# Patient Record
Sex: Male | Born: 1960 | Race: White | Hispanic: Yes | Smoking: Current every day smoker
Health system: Southern US, Community
[De-identification: ages and names within clinical notes are randomized; demographics above are authoritative.]

## PROBLEM LIST (undated history)

## (undated) DIAGNOSIS — I5032 Chronic diastolic (congestive) heart failure: Secondary | ICD-10-CM

## (undated) DIAGNOSIS — N19 Unspecified kidney failure: Secondary | ICD-10-CM

## (undated) DIAGNOSIS — I219 Acute myocardial infarction, unspecified: Secondary | ICD-10-CM

## (undated) DIAGNOSIS — E11319 Type 2 diabetes mellitus with unspecified diabetic retinopathy without macular edema: Secondary | ICD-10-CM

## (undated) DIAGNOSIS — N184 Chronic kidney disease, stage 4 (severe): Secondary | ICD-10-CM

## (undated) DIAGNOSIS — G629 Polyneuropathy, unspecified: Secondary | ICD-10-CM

## (undated) DIAGNOSIS — E119 Type 2 diabetes mellitus without complications: Secondary | ICD-10-CM

## (undated) DIAGNOSIS — F319 Bipolar disorder, unspecified: Secondary | ICD-10-CM

## (undated) DIAGNOSIS — J449 Chronic obstructive pulmonary disease, unspecified: Secondary | ICD-10-CM

## (undated) DIAGNOSIS — F0390 Unspecified dementia without behavioral disturbance: Secondary | ICD-10-CM

## (undated) DIAGNOSIS — D649 Anemia, unspecified: Secondary | ICD-10-CM

## (undated) DIAGNOSIS — G8929 Other chronic pain: Secondary | ICD-10-CM

## (undated) DIAGNOSIS — L97519 Non-pressure chronic ulcer of other part of right foot with unspecified severity: Secondary | ICD-10-CM

## (undated) DIAGNOSIS — I129 Hypertensive chronic kidney disease with stage 1 through stage 4 chronic kidney disease, or unspecified chronic kidney disease: Secondary | ICD-10-CM

## (undated) DIAGNOSIS — F1411 Cocaine abuse, in remission: Secondary | ICD-10-CM

## (undated) DIAGNOSIS — F1011 Alcohol abuse, in remission: Secondary | ICD-10-CM

## (undated) DIAGNOSIS — I639 Cerebral infarction, unspecified: Secondary | ICD-10-CM

## (undated) DIAGNOSIS — I10 Essential (primary) hypertension: Secondary | ICD-10-CM

## (undated) DIAGNOSIS — N049 Nephrotic syndrome with unspecified morphologic changes: Secondary | ICD-10-CM

## (undated) DIAGNOSIS — E785 Hyperlipidemia, unspecified: Secondary | ICD-10-CM

## (undated) DIAGNOSIS — Z8619 Personal history of other infectious and parasitic diseases: Secondary | ICD-10-CM

## (undated) DIAGNOSIS — E559 Vitamin D deficiency, unspecified: Secondary | ICD-10-CM

## (undated) DIAGNOSIS — S2231XA Fracture of one rib, right side, initial encounter for closed fracture: Secondary | ICD-10-CM

## (undated) DIAGNOSIS — F250 Schizoaffective disorder, bipolar type: Secondary | ICD-10-CM

## (undated) HISTORY — DX: Nephrotic syndrome with unspecified morphologic changes: N04.9

## (undated) HISTORY — DX: Non-pressure chronic ulcer of other part of right foot with unspecified severity: L97.519

## (undated) HISTORY — DX: Hypertensive chronic kidney disease with stage 1 through stage 4 chronic kidney disease, or unspecified chronic kidney disease: N18.4

## (undated) HISTORY — DX: Bipolar disorder, unspecified: F31.9

## (undated) HISTORY — DX: Hypertensive chronic kidney disease with stage 1 through stage 4 chronic kidney disease, or unspecified chronic kidney disease: I12.9

## (undated) HISTORY — PX: SPLENECTOMY, PARTIAL: SHX787

## (undated) HISTORY — DX: Alcohol abuse, in remission: F10.11

## (undated) HISTORY — DX: Polyneuropathy, unspecified: G62.9

## (undated) HISTORY — DX: Anemia, unspecified: D64.9

## (undated) HISTORY — DX: Other chronic pain: G89.29

## (undated) HISTORY — DX: Chronic diastolic (congestive) heart failure: I50.32

## (undated) HISTORY — DX: Cocaine abuse, in remission: F14.11

## (undated) HISTORY — DX: Vitamin D deficiency, unspecified: E55.9

## (undated) HISTORY — DX: Hyperlipidemia, unspecified: E78.5

## (undated) HISTORY — DX: Personal history of other infectious and parasitic diseases: Z86.19

## (undated) HISTORY — DX: Schizoaffective disorder, bipolar type: F25.0

## (undated) HISTORY — DX: Unspecified dementia, unspecified severity, without behavioral disturbance, psychotic disturbance, mood disturbance, and anxiety: F03.90

## (undated) HISTORY — DX: Type 2 diabetes mellitus with unspecified diabetic retinopathy without macular edema: E11.319

---
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
## 2012-09-14 DIAGNOSIS — I219 Acute myocardial infarction, unspecified: Secondary | ICD-10-CM

## 2012-09-14 HISTORY — DX: Acute myocardial infarction, unspecified: I21.9

## 2017-05-20 HISTORY — PX: US ECHOCARDIOGRAPHY: HXRAD669

## 2017-06-03 HISTORY — PX: RENAL BIOPSY: SHX156

## 2017-06-30 HISTORY — PX: US ECHOCARDIOGRAPHY: HXRAD669

## 2017-07-01 HISTORY — PX: CARDIOVASCULAR STRESS TEST: SHX262

## 2018-09-14 DIAGNOSIS — S2231XA Fracture of one rib, right side, initial encounter for closed fracture: Secondary | ICD-10-CM

## 2018-09-14 HISTORY — DX: Fracture of one rib, right side, initial encounter for closed fracture: S22.31XA

## 2018-10-15 DIAGNOSIS — N19 Unspecified kidney failure: Secondary | ICD-10-CM

## 2018-10-15 HISTORY — DX: Unspecified kidney failure: N19

## 2019-02-26 ENCOUNTER — Encounter (HOSPITAL_COMMUNITY): Payer: Self-pay | Admitting: Emergency Medicine

## 2019-02-26 ENCOUNTER — Other Ambulatory Visit: Payer: Self-pay

## 2019-02-26 ENCOUNTER — Emergency Department (HOSPITAL_COMMUNITY)
Admission: EM | Admit: 2019-02-26 | Discharge: 2019-02-26 | Disposition: A | Discharge status: Home / Self Care | Payer: Medicare Other | Attending: Emergency Medicine | Admitting: Emergency Medicine

## 2019-02-26 DIAGNOSIS — L84 Corns and callosities: Secondary | ICD-10-CM | POA: Insufficient documentation

## 2019-02-26 DIAGNOSIS — J449 Chronic obstructive pulmonary disease, unspecified: Secondary | ICD-10-CM | POA: Insufficient documentation

## 2019-02-26 DIAGNOSIS — Z4800 Encounter for change or removal of nonsurgical wound dressing: Secondary | ICD-10-CM | POA: Insufficient documentation

## 2019-02-26 DIAGNOSIS — E119 Type 2 diabetes mellitus without complications: Secondary | ICD-10-CM | POA: Diagnosis not present

## 2019-02-26 DIAGNOSIS — I252 Old myocardial infarction: Secondary | ICD-10-CM | POA: Diagnosis not present

## 2019-02-26 DIAGNOSIS — F17290 Nicotine dependence, other tobacco product, uncomplicated: Secondary | ICD-10-CM | POA: Insufficient documentation

## 2019-02-26 DIAGNOSIS — F17210 Nicotine dependence, cigarettes, uncomplicated: Secondary | ICD-10-CM | POA: Insufficient documentation

## 2019-02-26 DIAGNOSIS — Z8673 Personal history of transient ischemic attack (TIA), and cerebral infarction without residual deficits: Secondary | ICD-10-CM | POA: Diagnosis not present

## 2019-02-26 DIAGNOSIS — Z5189 Encounter for other specified aftercare: Secondary | ICD-10-CM

## 2019-02-26 DIAGNOSIS — I10 Essential (primary) hypertension: Secondary | ICD-10-CM | POA: Insufficient documentation

## 2019-02-26 HISTORY — DX: Essential (primary) hypertension: I10

## 2019-02-26 HISTORY — DX: Chronic obstructive pulmonary disease, unspecified: J44.9

## 2019-02-26 HISTORY — DX: Cerebral infarction, unspecified: I63.9

## 2019-02-26 HISTORY — DX: Type 2 diabetes mellitus without complications: E11.9

## 2019-02-26 LAB — CBG MONITORING, ED: Glucose-Capillary: 182 mg/dL — ABNORMAL HIGH (ref 70–99)

## 2019-02-26 NOTE — ED Notes (Signed)
Pt asking for food while in triage, asked if he had ate today and pt responded "no" and that he "slept most of the day"

## 2019-02-26 NOTE — ED Notes (Signed)
Wounds to distal plantar surface of R foot   There are 2 wounds

## 2019-02-26 NOTE — ED Notes (Signed)
Pt reports three (3) month history of wounds to plantar surface R foot   He [place]-   Has not seen physician in [place] "because I didn't think it was a big deal" Here for eval   Jello and water provided for complaint of hunger

## 2019-02-26 NOTE — ED Triage Notes (Signed)
Patient c/o diabetic wound to bottom of right foot. Per patient wound appeared 3 month and is continually getting bigger. Denies any drainage or fevers. Per patient blood sugars 130-190s.

## 2019-02-26 NOTE — ED Notes (Signed)
BS 165

## 2019-02-26 NOTE — ED Triage Notes (Signed)
Pt request meal in triage   Here with daughter who is also checking in

## 2019-02-26 NOTE — ED Notes (Signed)
[place] address [place], asking for food  Complains of foot ulcer for 3 months

## 2019-02-26 NOTE — Discharge Instructions (Addendum)
Follow-up with your PCP or podiatry. "Mole skin" or other similar product may help alleviate some of the pressure in these areas until you can be seen.

## 2019-02-27 LAB — CBG MONITORING, ED: Glucose-Capillary: 165 mg/dL — ABNORMAL HIGH (ref 70–99)

## 2019-03-01 NOTE — ED Provider Notes (Signed)
[facility] EMERGENCY DEPARTMENT Provider Note   CSN: [id number] Arrival date & time: 02/26/19  1638     History   Chief Complaint Chief Complaint  Patient presents with  . Wound Check    HPI Barry Lewis is a 58 y.o. male.     HPI   83yM with wounds to R foot. Has had previously treated by podiatry with resolution. Says they came back 2-3 months ago. Hx of neuropathy but says these are painful when applying pressure.   Past Medical History:  Diagnosis Date  . Bipolar 1 disorder ([place])   . COPD (chronic obstructive pulmonary disease) ([place])   . Diabetes mellitus without complication ([place])   . Hypertension   . MI (myocardial infarction) ([place])   . Renal disorder   . Stroke [facility])     There are no active problems to display for this patient.   Past Surgical History:  Procedure Laterality Date  . SPLENECTOMY, PARTIAL        Home Medications    Prior to Admission medications   Not on File    Family History History reviewed. No pertinent family history.  Social History Social History   Tobacco Use  . Smoking status: Current Every Day Smoker    Packs/day: 0.50    Years: 1.50    Pack years: 0.75    Types: Cigarettes, Cigars  . Smokeless tobacco: Never Used  Substance Use Topics  . Alcohol use: Not Currently    Frequency: Never    Comment: occasional  . Drug use: Not Currently    Types: Cocaine    Comment: not in past 2 months     Allergies   Lithium and Ibuprofen   Review of Systems Review of Systems  All systems reviewed and negative, other than as noted in HPI.  Physical Exam Updated Vital Signs BP (!) 150/70 (BP Location: Right Arm)   Pulse (!) 50   Temp 98.1 F (36.7 C) (Oral)   Resp 16   Ht 5\' 6"  (1.676 m)   Wt 87.5 kg   SpO2 99%   BMI 31.15 kg/m   Physical Exam Vitals signs and nursing note reviewed.  Constitutional:      General: He is not in acute distress.    Appearance: He is well-developed.  HENT:     Head:  Normocephalic and atraumatic.  Eyes:     General:        Right eye: No discharge.        Left eye: No discharge.     Conjunctiva/sclera: Conjunctivae normal.  Neck:     Musculoskeletal: Neck supple.  Cardiovascular:     Rate and Rhythm: Normal rate and regular rhythm.     Heart sounds: Normal heart sounds. No murmur. No friction rub. No gallop.   Pulmonary:     Effort: Pulmonary effort is normal. No respiratory distress.     Breath sounds: Normal breath sounds.  Abdominal:     General: There is no distension.     Palpations: Abdomen is soft.     Tenderness: There is no abdominal tenderness.  Musculoskeletal:        General: No tenderness.     Comments: Two calloused areas to plantar surface of R distal foot. These do not appears to be infected.   Skin:    General: Skin is warm and dry.  Neurological:     Mental Status: He is alert.  Psychiatric:        Behavior:  Behavior normal.        Thought Content: Thought content normal.    ED Treatments / Results  Labs (all labs ordered are listed, but only abnormal results are displayed) Labs Reviewed  CBG MONITORING, ED - Abnormal; Notable for the following components:      Result Value   Glucose-Capillary 182 (*)    All other components within normal limits  CBG MONITORING, ED - Abnormal; Notable for the following components:   Glucose-Capillary 165 (*)    All other components within normal limits    EKG    Radiology No results found.  Procedures Procedures (including critical care time)  Medications Ordered in ED Medications - No data to display   Initial Impression / Assessment and Plan / ED Course  I have reviewed the triage vital signs and the nursing notes.  Pertinent labs & imaging results that were available during my care of the patient were reviewed by me and considered in my medical decision making (see chart for details).     calluses to R foot. Not infected. Needs podiatry fu for further management.    Final Clinical Impressions(s) / ED Diagnoses   Final diagnoses:  Visit for wound check  Foot callus    ED Discharge Orders    None       Virgel Manifold, MD 03/01/19 [phone]

## 2019-05-04 ENCOUNTER — Encounter: Payer: Self-pay | Admitting: Family Medicine

## 2019-05-04 DIAGNOSIS — I5032 Chronic diastolic (congestive) heart failure: Secondary | ICD-10-CM | POA: Insufficient documentation

## 2019-05-11 ENCOUNTER — Encounter: Payer: Self-pay | Admitting: Family Medicine

## 2019-05-15 ENCOUNTER — Encounter: Payer: Self-pay | Admitting: Family Medicine

## 2019-05-16 ENCOUNTER — Ambulatory Visit: Payer: Medicare Other | Admitting: Family Medicine

## 2019-05-24 ENCOUNTER — Encounter: Payer: Self-pay | Admitting: General Practice

## 2019-06-14 ENCOUNTER — Inpatient Hospital Stay
Admission: AD | Admit: 2019-06-14 | Payer: Medicare Other | Source: Other Acute Inpatient Hospital | Admitting: Family Medicine

## 2019-06-23 ENCOUNTER — Ambulatory Visit: Payer: Medicare Other | Admitting: Family Medicine

## 2019-10-13 ENCOUNTER — Inpatient Hospital Stay (HOSPITAL_COMMUNITY)
Admission: EM | Admit: 2019-10-13 | Discharge: 2019-10-18 | DRG: 682 | Disposition: A | Discharge status: Rehabilitation Facility | Payer: Medicare Other | Attending: Internal Medicine | Admitting: Internal Medicine

## 2019-10-13 ENCOUNTER — Emergency Department (HOSPITAL_COMMUNITY): Payer: Medicare Other

## 2019-10-13 DIAGNOSIS — T796XXS Traumatic ischemia of muscle, sequela: Secondary | ICD-10-CM | POA: Diagnosis not present

## 2019-10-13 DIAGNOSIS — S22030A Wedge compression fracture of third thoracic vertebra, initial encounter for closed fracture: Secondary | ICD-10-CM | POA: Diagnosis not present

## 2019-10-13 DIAGNOSIS — E860 Dehydration: Secondary | ICD-10-CM | POA: Diagnosis present

## 2019-10-13 DIAGNOSIS — I5042 Chronic combined systolic (congestive) and diastolic (congestive) heart failure: Secondary | ICD-10-CM | POA: Diagnosis present

## 2019-10-13 DIAGNOSIS — Y92009 Unspecified place in unspecified non-institutional (private) residence as the place of occurrence of the external cause: Secondary | ICD-10-CM

## 2019-10-13 DIAGNOSIS — I130 Hypertensive heart and chronic kidney disease with heart failure and stage 1 through stage 4 chronic kidney disease, or unspecified chronic kidney disease: Secondary | ICD-10-CM | POA: Diagnosis present

## 2019-10-13 DIAGNOSIS — Z79899 Other long term (current) drug therapy: Secondary | ICD-10-CM

## 2019-10-13 DIAGNOSIS — M542 Cervicalgia: Secondary | ICD-10-CM

## 2019-10-13 DIAGNOSIS — T1490XA Injury, unspecified, initial encounter: Secondary | ICD-10-CM

## 2019-10-13 DIAGNOSIS — S42101A Fracture of unspecified part of scapula, right shoulder, initial encounter for closed fracture: Secondary | ICD-10-CM | POA: Diagnosis present

## 2019-10-13 DIAGNOSIS — G8929 Other chronic pain: Secondary | ICD-10-CM | POA: Diagnosis present

## 2019-10-13 DIAGNOSIS — T07XXXA Unspecified multiple injuries, initial encounter: Secondary | ICD-10-CM

## 2019-10-13 DIAGNOSIS — S12601A Unspecified nondisplaced fracture of seventh cervical vertebra, initial encounter for closed fracture: Secondary | ICD-10-CM | POA: Diagnosis present

## 2019-10-13 DIAGNOSIS — R739 Hyperglycemia, unspecified: Secondary | ICD-10-CM | POA: Diagnosis not present

## 2019-10-13 DIAGNOSIS — I2510 Atherosclerotic heart disease of native coronary artery without angina pectoris: Secondary | ICD-10-CM | POA: Diagnosis present

## 2019-10-13 DIAGNOSIS — L97419 Non-pressure chronic ulcer of right heel and midfoot with unspecified severity: Secondary | ICD-10-CM | POA: Diagnosis present

## 2019-10-13 DIAGNOSIS — E1122 Type 2 diabetes mellitus with diabetic chronic kidney disease: Secondary | ICD-10-CM | POA: Diagnosis present

## 2019-10-13 DIAGNOSIS — L97519 Non-pressure chronic ulcer of other part of right foot with unspecified severity: Secondary | ICD-10-CM | POA: Diagnosis present

## 2019-10-13 DIAGNOSIS — J90 Pleural effusion, not elsewhere classified: Secondary | ICD-10-CM

## 2019-10-13 DIAGNOSIS — N179 Acute kidney failure, unspecified: Secondary | ICD-10-CM | POA: Diagnosis present

## 2019-10-13 DIAGNOSIS — Z9119 Patient's noncompliance with other medical treatment and regimen: Secondary | ICD-10-CM

## 2019-10-13 DIAGNOSIS — I10 Essential (primary) hypertension: Secondary | ICD-10-CM | POA: Diagnosis present

## 2019-10-13 DIAGNOSIS — W010XXA Fall on same level from slipping, tripping and stumbling without subsequent striking against object, initial encounter: Secondary | ICD-10-CM | POA: Diagnosis present

## 2019-10-13 DIAGNOSIS — E785 Hyperlipidemia, unspecified: Secondary | ICD-10-CM | POA: Diagnosis present

## 2019-10-13 DIAGNOSIS — E119 Type 2 diabetes mellitus without complications: Secondary | ICD-10-CM | POA: Diagnosis not present

## 2019-10-13 DIAGNOSIS — E1165 Type 2 diabetes mellitus with hyperglycemia: Secondary | ICD-10-CM | POA: Diagnosis not present

## 2019-10-13 DIAGNOSIS — I5032 Chronic diastolic (congestive) heart failure: Secondary | ICD-10-CM | POA: Diagnosis not present

## 2019-10-13 DIAGNOSIS — Z8673 Personal history of transient ischemic attack (TIA), and cerebral infarction without residual deficits: Secondary | ICD-10-CM

## 2019-10-13 DIAGNOSIS — E08621 Diabetes mellitus due to underlying condition with foot ulcer: Secondary | ICD-10-CM | POA: Diagnosis not present

## 2019-10-13 DIAGNOSIS — E1142 Type 2 diabetes mellitus with diabetic polyneuropathy: Secondary | ICD-10-CM

## 2019-10-13 DIAGNOSIS — L0390 Cellulitis, unspecified: Secondary | ICD-10-CM

## 2019-10-13 DIAGNOSIS — Z20822 Contact with and (suspected) exposure to covid-19: Secondary | ICD-10-CM | POA: Diagnosis present

## 2019-10-13 DIAGNOSIS — S060X9A Concussion with loss of consciousness of unspecified duration, initial encounter: Secondary | ICD-10-CM | POA: Diagnosis present

## 2019-10-13 DIAGNOSIS — I129 Hypertensive chronic kidney disease with stage 1 through stage 4 chronic kidney disease, or unspecified chronic kidney disease: Secondary | ICD-10-CM | POA: Diagnosis present

## 2019-10-13 DIAGNOSIS — M549 Dorsalgia, unspecified: Secondary | ICD-10-CM | POA: Diagnosis present

## 2019-10-13 DIAGNOSIS — E11621 Type 2 diabetes mellitus with foot ulcer: Secondary | ICD-10-CM | POA: Diagnosis present

## 2019-10-13 DIAGNOSIS — B1920 Unspecified viral hepatitis C without hepatic coma: Secondary | ICD-10-CM | POA: Diagnosis present

## 2019-10-13 DIAGNOSIS — S0081XA Abrasion of other part of head, initial encounter: Secondary | ICD-10-CM | POA: Diagnosis present

## 2019-10-13 DIAGNOSIS — M6282 Rhabdomyolysis: Secondary | ICD-10-CM | POA: Diagnosis present

## 2019-10-13 DIAGNOSIS — G9341 Metabolic encephalopathy: Secondary | ICD-10-CM | POA: Diagnosis present

## 2019-10-13 DIAGNOSIS — J449 Chronic obstructive pulmonary disease, unspecified: Secondary | ICD-10-CM | POA: Diagnosis present

## 2019-10-13 DIAGNOSIS — F250 Schizoaffective disorder, bipolar type: Secondary | ICD-10-CM | POA: Diagnosis present

## 2019-10-13 DIAGNOSIS — E669 Obesity, unspecified: Secondary | ICD-10-CM | POA: Diagnosis present

## 2019-10-13 DIAGNOSIS — S2243XA Multiple fractures of ribs, bilateral, initial encounter for closed fracture: Secondary | ICD-10-CM | POA: Diagnosis present

## 2019-10-13 DIAGNOSIS — Y92002 Bathroom of unspecified non-institutional (private) residence single-family (private) house as the place of occurrence of the external cause: Secondary | ICD-10-CM | POA: Diagnosis not present

## 2019-10-13 DIAGNOSIS — F0390 Unspecified dementia without behavioral disturbance: Secondary | ICD-10-CM | POA: Diagnosis present

## 2019-10-13 DIAGNOSIS — F17210 Nicotine dependence, cigarettes, uncomplicated: Secondary | ICD-10-CM | POA: Diagnosis present

## 2019-10-13 DIAGNOSIS — Z6834 Body mass index (BMI) 34.0-34.9, adult: Secondary | ICD-10-CM

## 2019-10-13 DIAGNOSIS — I252 Old myocardial infarction: Secondary | ICD-10-CM

## 2019-10-13 DIAGNOSIS — E43 Unspecified severe protein-calorie malnutrition: Secondary | ICD-10-CM

## 2019-10-13 DIAGNOSIS — Z794 Long term (current) use of insulin: Secondary | ICD-10-CM

## 2019-10-13 DIAGNOSIS — W19XXXA Unspecified fall, initial encounter: Secondary | ICD-10-CM

## 2019-10-13 DIAGNOSIS — R601 Generalized edema: Secondary | ICD-10-CM

## 2019-10-13 DIAGNOSIS — R339 Retention of urine, unspecified: Secondary | ICD-10-CM | POA: Diagnosis not present

## 2019-10-13 DIAGNOSIS — N184 Chronic kidney disease, stage 4 (severe): Secondary | ICD-10-CM | POA: Diagnosis present

## 2019-10-13 DIAGNOSIS — Z888 Allergy status to other drugs, medicaments and biological substances status: Secondary | ICD-10-CM

## 2019-10-13 DIAGNOSIS — L97411 Non-pressure chronic ulcer of right heel and midfoot limited to breakdown of skin: Secondary | ICD-10-CM | POA: Diagnosis not present

## 2019-10-13 DIAGNOSIS — Z23 Encounter for immunization: Secondary | ICD-10-CM | POA: Diagnosis not present

## 2019-10-13 DIAGNOSIS — E872 Acidosis: Secondary | ICD-10-CM | POA: Diagnosis present

## 2019-10-13 DIAGNOSIS — Z9081 Acquired absence of spleen: Secondary | ICD-10-CM

## 2019-10-13 LAB — COMPREHENSIVE METABOLIC PANEL
ALT: 115 U/L — ABNORMAL HIGH (ref 0–44)
ALT: 91 U/L — ABNORMAL HIGH (ref 0–44)
ALT: 88 U/L — ABNORMAL HIGH (ref 0–44)
AST: 61 U/L — ABNORMAL HIGH (ref 15–41)
AST: 38 U/L (ref 15–41)
AST: 36 U/L (ref 15–41)
Albumin: 2 g/dL — ABNORMAL LOW (ref 3.5–5.0)
Albumin: 1.7 g/dL — ABNORMAL LOW (ref 3.5–5.0)
Albumin: 1.7 g/dL — ABNORMAL LOW (ref 3.5–5.0)
Alkaline Phosphatase: 138 U/L — ABNORMAL HIGH (ref 38–126)
Alkaline Phosphatase: 117 U/L (ref 38–126)
Alkaline Phosphatase: 118 U/L (ref 38–126)
Anion gap: 12 (ref 5–15)
Anion gap: 11 (ref 5–15)
Anion gap: 11 (ref 5–15)
BUN: 62 mg/dL — ABNORMAL HIGH (ref 6–20)
BUN: 59 mg/dL — ABNORMAL HIGH (ref 6–20)
BUN: 59 mg/dL — ABNORMAL HIGH (ref 6–20)
CO2: 16 mmol/L — ABNORMAL LOW (ref 22–32)
CO2: 14 mmol/L — ABNORMAL LOW (ref 22–32)
CO2: 16 mmol/L — ABNORMAL LOW (ref 22–32)
Calcium: 7.1 mg/dL — ABNORMAL LOW (ref 8.9–10.3)
Calcium: 7.1 mg/dL — ABNORMAL LOW (ref 8.9–10.3)
Calcium: 7.1 mg/dL — ABNORMAL LOW (ref 8.9–10.3)
Chloride: 105 mmol/L (ref 98–111)
Chloride: 110 mmol/L (ref 98–111)
Chloride: 105 mmol/L (ref 98–111)
Creatinine, Ser: 5.06 mg/dL — ABNORMAL HIGH (ref 0.61–1.24)
Creatinine, Ser: 4.8 mg/dL — ABNORMAL HIGH (ref 0.61–1.24)
Creatinine, Ser: 4.77 mg/dL — ABNORMAL HIGH (ref 0.61–1.24)
GFR calc Af Amer: 14 mL/min — ABNORMAL LOW (ref >60)
GFR calc Af Amer: 15 mL/min — ABNORMAL LOW (ref >60)
GFR calc Af Amer: 15 mL/min — ABNORMAL LOW (ref >60)
GFR calc non Af Amer: 12 mL/min — ABNORMAL LOW (ref >60)
GFR calc non Af Amer: 13 mL/min — ABNORMAL LOW (ref >60)
GFR calc non Af Amer: 13 mL/min — ABNORMAL LOW (ref >60)
Glucose, Bld: 306 mg/dL — ABNORMAL HIGH (ref 70–99)
Glucose, Bld: 242 mg/dL — ABNORMAL HIGH (ref 70–99)
Glucose, Bld: 229 mg/dL — ABNORMAL HIGH (ref 70–99)
Potassium: 5 mmol/L (ref 3.5–5.1)
Potassium: 4.8 mmol/L (ref 3.5–5.1)
Potassium: 4.9 mmol/L (ref 3.5–5.1)
Sodium: 133 mmol/L — ABNORMAL LOW (ref 135–145)
Sodium: 135 mmol/L (ref 135–145)
Sodium: 132 mmol/L — ABNORMAL LOW (ref 135–145)
Total Bilirubin: 0.6 mg/dL (ref 0.3–1.2)
Total Bilirubin: 0.5 mg/dL (ref 0.3–1.2)
Total Bilirubin: 0.4 mg/dL (ref 0.3–1.2)
Total Protein: 6.4 g/dL — ABNORMAL LOW (ref 6.5–8.1)
Total Protein: 5.4 g/dL — ABNORMAL LOW (ref 6.5–8.1)
Total Protein: 5.6 g/dL — ABNORMAL LOW (ref 6.5–8.1)

## 2019-10-13 LAB — URINALYSIS, ROUTINE W REFLEX MICROSCOPIC
Bacteria, UA: NONE SEEN
Bilirubin Urine: NEGATIVE
Glucose, UA: >=500 mg/dL — AB
Hgb urine dipstick: NEGATIVE
Ketones, ur: 5 mg/dL — AB
Leukocytes,Ua: NEGATIVE
Nitrite: NEGATIVE
Protein, ur: >=300 mg/dL — AB
Specific Gravity, Urine: 1.013 (ref 1.005–1.030)
pH: 6 (ref 5.0–8.0)

## 2019-10-13 LAB — HEMOGLOBIN A1C
Hgb A1c MFr Bld: 8.5 % — ABNORMAL HIGH (ref 4.8–5.6)
Mean Plasma Glucose: 197.25 mg/dL

## 2019-10-13 LAB — HIV ANTIBODY (ROUTINE TESTING W REFLEX): HIV Screen 4th Generation wRfx: NONREACTIVE

## 2019-10-13 LAB — CBC
HCT: 36.1 % — ABNORMAL LOW (ref 39.0–52.0)
Hemoglobin: 12.2 g/dL — ABNORMAL LOW (ref 13.0–17.0)
MCH: 32.7 pg (ref 26.0–34.0)
MCHC: 33.8 g/dL (ref 30.0–36.0)
MCV: 96.8 fL (ref 80.0–100.0)
Platelets: 340 10*3/uL (ref 150–400)
RBC: 3.73 MIL/uL — ABNORMAL LOW (ref 4.22–5.81)
RDW: 14.9 % (ref 11.5–15.5)
WBC: 8.7 10*3/uL (ref 4.0–10.5)
nRBC: 0 % (ref 0.0–0.2)

## 2019-10-13 LAB — PROTIME-INR
INR: 1 (ref 0.8–1.2)
Prothrombin Time: 12.9 seconds (ref 11.4–15.2)

## 2019-10-13 LAB — SARS CORONAVIRUS 2 (TAT 6-24 HRS): SARS Coronavirus 2: NEGATIVE

## 2019-10-13 LAB — SAMPLE TO BLOOD BANK

## 2019-10-13 LAB — GLUCOSE, CAPILLARY
Glucose-Capillary: 262 mg/dL — ABNORMAL HIGH (ref 70–99)
Glucose-Capillary: 198 mg/dL — ABNORMAL HIGH (ref 70–99)
Glucose-Capillary: 257 mg/dL — ABNORMAL HIGH (ref 70–99)

## 2019-10-13 LAB — CBG MONITORING, ED: Glucose-Capillary: 259 mg/dL — ABNORMAL HIGH (ref 70–99)

## 2019-10-13 LAB — CK
Total CK: 649 U/L — ABNORMAL HIGH (ref 49–397)
Total CK: 262 U/L (ref 49–397)

## 2019-10-13 LAB — ETHANOL: Alcohol, Ethyl (B): <10 mg/dL (ref <10)

## 2019-10-13 LAB — LACTIC ACID, PLASMA: Lactic Acid, Venous: 1.1 mmol/L (ref 0.5–1.9)

## 2019-10-13 MED ORDER — VENLAFAXINE HCL ER 75 MG PO CP24: 225.0000 mg | ORAL_CAPSULE | Freq: Every day | ORAL | Status: DC

## 2019-10-13 MED ORDER — INSULIN ASPART 100 UNIT/ML ~~LOC~~ SOLN
0.0000 [IU] | Freq: Every day | SUBCUTANEOUS | Status: DC
  Administered 2019-10-13 – 2019-10-15 (×2): 3 [IU] via SUBCUTANEOUS
  Administered 2019-10-16 – 2019-10-17 (×2): 2 [IU] via SUBCUTANEOUS

## 2019-10-13 MED ORDER — ONDANSETRON HCL 4 MG/2ML IJ SOLN: 4.0000 mg | Freq: Four times a day (QID) | INTRAMUSCULAR | Status: DC | PRN

## 2019-10-13 MED ORDER — GABAPENTIN 300 MG PO CAPS: 300.0000 mg | ORAL_CAPSULE | Freq: Two times a day (BID) | ORAL | Status: DC

## 2019-10-13 MED ORDER — OLANZAPINE 5 MG PO TABS
5.0000 mg | ORAL_TABLET | Freq: Every day | ORAL | Status: DC
  Administered 2019-10-13: 22:00:00 5 mg via ORAL
  Filled 2019-10-13: qty 1

## 2019-10-13 MED ORDER — INSULIN ASPART 100 UNIT/ML ~~LOC~~ SOLN
0.0000 [IU] | Freq: Three times a day (TID) | SUBCUTANEOUS | Status: DC
  Administered 2019-10-13: 8 [IU] via SUBCUTANEOUS
  Administered 2019-10-13: 3 [IU] via SUBCUTANEOUS
  Administered 2019-10-14: 12:00:00 5 [IU] via SUBCUTANEOUS
  Administered 2019-10-14: 08:00:00 11 [IU] via SUBCUTANEOUS
  Administered 2019-10-15: 18:00:00 5 [IU] via SUBCUTANEOUS
  Administered 2019-10-15: 8 [IU] via SUBCUTANEOUS
  Administered 2019-10-16: 04:00:00 2 [IU] via SUBCUTANEOUS
  Administered 2019-10-16: 5 [IU] via SUBCUTANEOUS
  Administered 2019-10-16 – 2019-10-17 (×2): 8 [IU] via SUBCUTANEOUS
  Administered 2019-10-17: 12:00:00 3 [IU] via SUBCUTANEOUS
  Administered 2019-10-17: 06:00:00 2 [IU] via SUBCUTANEOUS
  Administered 2019-10-18: 5 [IU] via SUBCUTANEOUS
  Administered 2019-10-18: 12:00:00 8 [IU] via SUBCUTANEOUS

## 2019-10-13 MED ORDER — GABAPENTIN 600 MG PO TABS: 600.0000 mg | ORAL_TABLET | Freq: Every day | ORAL | Status: DC

## 2019-10-13 MED ORDER — ONDANSETRON HCL 4 MG PO TABS: 4.0000 mg | ORAL_TABLET | Freq: Four times a day (QID) | ORAL | Status: DC | PRN

## 2019-10-13 MED ORDER — LACTATED RINGERS IV SOLN: INTRAVENOUS | Status: DC

## 2019-10-13 MED ORDER — TRAZODONE HCL 100 MG PO TABS
200.0000 mg | ORAL_TABLET | Freq: Every day | ORAL | Status: DC
  Administered 2019-10-13 – 2019-10-17 (×4): 200 mg via ORAL
  Filled 2019-10-13 (×4): qty 2

## 2019-10-13 MED ORDER — AMLODIPINE BESYLATE 10 MG PO TABS
10.0000 mg | ORAL_TABLET | Freq: Every day | ORAL | Status: DC
  Administered 2019-10-13 – 2019-10-18 (×6): 10 mg via ORAL
  Filled 2019-10-13: qty 2
  Filled 2019-10-13 (×3): qty 1
  Filled 2019-10-13: qty 2
  Filled 2019-10-13: qty 1

## 2019-10-13 MED ORDER — HEPARIN SODIUM (PORCINE) 5000 UNIT/ML IJ SOLN
5000.0000 [IU] | Freq: Three times a day (TID) | INTRAMUSCULAR | Status: DC
  Administered 2019-10-13 – 2019-10-18 (×13): 5000 [IU] via SUBCUTANEOUS
  Filled 2019-10-13 (×14): qty 1

## 2019-10-13 MED ORDER — FENTANYL CITRATE (PF) 100 MCG/2ML IJ SOLN
100.0000 ug | Freq: Once | INTRAMUSCULAR | Status: AC
  Administered 2019-10-13: 100 ug via INTRAMUSCULAR
  Filled 2019-10-13: qty 2

## 2019-10-13 MED ORDER — DOCUSATE SODIUM 100 MG PO CAPS
100.0000 mg | ORAL_CAPSULE | Freq: Two times a day (BID) | ORAL | Status: DC
  Administered 2019-10-13 – 2019-10-18 (×9): 100 mg via ORAL
  Filled 2019-10-13 (×9): qty 1

## 2019-10-13 MED ORDER — INSULIN DEGLUDEC 200 UNIT/ML ~~LOC~~ SOPN: 32.0000 [IU] | PEN_INJECTOR | Freq: Every day | SUBCUTANEOUS | Status: DC

## 2019-10-13 MED ORDER — ACETAMINOPHEN 650 MG RE SUPP: 650.0000 mg | Freq: Four times a day (QID) | RECTAL | Status: DC | PRN

## 2019-10-13 MED ORDER — INSULIN GLARGINE 100 UNIT/ML ~~LOC~~ SOLN
32.0000 [IU] | Freq: Every day | SUBCUTANEOUS | Status: DC
  Filled 2019-10-13: qty 0.32

## 2019-10-13 MED ORDER — SODIUM CHLORIDE 0.9 % IV BOLUS (SEPSIS)
1000.0000 mL | Freq: Once | INTRAVENOUS | Status: AC
  Administered 2019-10-13: 06:00:00 1000 mL via INTRAVENOUS

## 2019-10-13 MED ORDER — ACETAMINOPHEN 325 MG PO TABS
650.0000 mg | ORAL_TABLET | Freq: Four times a day (QID) | ORAL | Status: DC | PRN
  Administered 2019-10-13 – 2019-10-14 (×2): 650 mg via ORAL
  Filled 2019-10-13 (×3): qty 2

## 2019-10-13 MED ORDER — CARBAMAZEPINE ER 200 MG PO TB12
600.0000 mg | ORAL_TABLET | Freq: Every day | ORAL | Status: DC
  Administered 2019-10-13: 22:00:00 600 mg via ORAL
  Filled 2019-10-13: qty 3

## 2019-10-13 NOTE — ED Provider Notes (Signed)
Pt signed out by Dr. Christy Gentles pending call back from admission service.  Pt d/w Dr. Lorin Mercy (triad) for admission.     Isla Pence, MD 10/13/19 [phone]
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
## 2019-10-13 NOTE — Consult Note (Signed)
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 Generalized age-related cerebral atrophy with mild chronic small vessel ischemic disease. CT CERVICAL SPINE: 1. Mild height loss at the superior endplate of C7, age indeterminate. Correlation with physical exam for possible pain at this location recommended. Additionally, finding could be further assessed with dedicated MRI as clinically warranted. 2. Nondisplaced fractures of the right transverse processes of T1 and T2, also somewhat age indeterminate. 3. Remote fracture of the T1 spinous process, stable from previous. Electronically Signed   By: Jeannine Boga M.D.   On: 10/13/2019 04:33   CT T-SPINE NO CHARGE  Result Date: 10/13/2019 CLINICAL DATA:  Initial evaluation for acute trauma, fall. Back pain. EXAM: CT THORACIC SPINE WITHOUT CONTRAST TECHNIQUE: Multidetector CT images of the thoracic were obtained using the standard protocol without intravenous contrast. COMPARISON:  Prior CT from 04/26/2019. FINDINGS: Alignment: Vertebral bodies normally aligned with preservation of the normal thoracic kyphosis. No listhesis. Vertebrae:  Chronic height loss at the superior endplates of T2 and T4 is stable from previous exam. Remote fracture of the T1 spinous process also unchanged. Mild approximate 20% height loss at the superior endplate of T3, new from previous, and could be acute to subacute in nature. No bony retropulsion. Additionally, there are nondisplaced fractures of the right transverse processes of T1, T2, and T3, also new from previous, and could be acute to subacute in nature. Otherwise, vertebral body height maintained with no other acute fracture identified. No discrete osseous lesions. Multiple remotely healed rib fractures noted, stable. Paraspinal and other soft tissues: Paraspinous soft tissues demonstrate no acute finding. Large right greater than left pleural effusions with associated atelectasis and/or consolidation, better evaluated on concomitant chest CT. Disc levels: Mild scattered degenerative endplate spurring noted within the mid and lower thoracic spine. No appreciable spinal stenosis. IMPRESSION: 1. Mild approximate 20% height loss at the superior endplate of T3, somewhat age indeterminate, but new from previous, and could be acute to subacute in nature. No bony retropulsion. Correlation with physical exam recommended. 2. Nondisplaced fractures of the right transverse processes of T1, T2, and T3, also new from previous, and could be acute to subacute  in nature. 3. Chronic compression deformities at the superior endplates of T2 and T4, stable. 4. Large right greater than left pleural effusions with associated atelectasis and/or consolidation, better evaluated on concomitant chest CT. Electronically Signed   By: Jeannine Boga M.D.   On: 10/13/2019 03:54   CT L-SPINE NO CHARGE  Result Date: 10/13/2019 CLINICAL DATA:  Initial evaluation for acute trauma, fall. EXAM: CT LUMBAR SPINE WITHOUT CONTRAST TECHNIQUE: Multidetector CT imaging of the lumbar spine was performed without intravenous contrast administration.  Multiplanar CT image reconstructions were also generated. COMPARISON:  Prior CT from 04/26/2019. FINDINGS: Segmentation: Standard. Lowest well-formed disc space labeled the L5-S1 level. Alignment: Physiologic with preservation of the normal lumbar lordosis. No subluxation or malalignment. Vertebrae: Vertebral body height maintained without evidence for acute or chronic fracture. Visualized sacrum and pelvis intact. SI joints approximated symmetric. No discrete osseous lesions. Paraspinal and other soft tissues: Paraspinous soft tissues within normal limits. Disc levels: L1-2:  Unremarkable. L2-3:  Negative interspace. Mild facet hypertrophy. No stenosis. L3-4: Chronic intervertebral disc space narrowing with diffuse disc bulge. Mild facet hypertrophy. Resultant mild-to-moderate spinal stenosis. Moderate right with mild left foraminal narrowing. L4-5: Mild disc bulge. Bilateral facet hypertrophy. No significant stenosis. L5-S1:  Minimal disc bulge. No stenosis. IMPRESSION: 1. No acute traumatic injury within the lumbar spine. 2. Degenerative disc bulge with facet hypertrophy at L3-4 with resultant mild to moderate spinal stenosis. Electronically Signed   By: Jeannine Boga M.D.   On: 10/13/2019 04:05   DG Chest Port 1 View  Result Date: 10/13/2019 CLINICAL DATA:  Fall EXAM: PORTABLE CHEST 1 VIEW COMPARISON:  06/11/2019 FINDINGS: Cardiomegaly. Probable scarring in the left lung, similar to prior study. Right lung clear. No effusions. Multiple old left rib fractures. There appear to be acute lateral left rib fractures of the 5th through 7th ribs. No pneumothorax. IMPRESSION: Multiple old left rib fractures. There appear to be acute superimposed fractures in the lateral left 5th through 7th ribs. Chronic opacities in the left lung, likely scarring. Mild cardiomegaly. Electronically Signed   By: Rolm Baptise M.D.   On: 10/13/2019 02:34   DG Cerv Spine Flex&Ext Only  Result Date: 10/13/2019 CLINICAL DATA:   Fall.  Back pain. EXAM: CERVICAL SPINE - FLEXION AND EXTENSION VIEWS ONLY COMPARISON:  CT 10/13/2019. FINDINGS: C6 and C7 not completely visualized. Mild prevertebral soft tissue swelling cannot be excluded. Diffuse osteopenia and degenerative change present. No evidence of flexion or extension instability. Reference is made to CT for discussion of cervical and thoracic fractures present. IMPRESSION: 1.  Mild prevertebral soft tissue swelling cannot be excluded. 2. C6 and C7 not completely visualized. No flexion or extension instability. Reference is made to CT for discussion of cervical and thoracic fractures present. 3.  Diffuse osteopenia and degenerative change. Electronically Signed   By: Marcello Moores  Register   On: 10/13/2019 07:54    ROS 10 point review of systems is negative except as listed above in HPI.  Blood pressure (!) 158/85, pulse 80, temperature 97.6 F (36.4 C), temperature source Oral, resp. rate 10, height 5\' 6"  (1.676 m), weight 87.5 kg, SpO2 98 %.  Secondary Survey:  GCS: E(3)//V(4)//M(6) Skull: normocephalic, atraumatic Eyes: PEERL, 68mm b/l Face: midface stable without deformity Oropharynx: no blood Neck: trachea midline, c-collar in place, + midline cervical TTP inferiorly, no C-spine stepoffs Chest: BS equal b/l, + L lateral chest wall TTP and bruising Abdomen: soft, NT, no bruising FAST: not performed Pelvis: stable GU: no blood at meatus  Extremities: 2+ radial and DP b/l, motor and sensation intact to b/l UE and LE chronic appearing wounds at R 1st and 4th metatarsal heads    Assessment/Plan: Problem List 24M s/p fall, prolonged down time  Plan Age indeterminate C7 superior endplate height loss - TTP on physical exam suggestive of acute injury. Flex-ex films with limited visualization in this area. Will defer to [place] regarding c-collar removal.  Concussion - SLP eval for TBI thx R T1-3 TP fractures, T3 superior endplate loss of height - unclear acuity, appreciate  NSGY recs L scapular wing fracture - ortho c/s Bilateral rib frx (L-3,5,6,11; R-6,7,9,10) - pulmonary toilet, PT/OT, incentive spirometry FEN - cleared for diet from trauma standpoint DVT - SCDs, recommend LMWH 30mg  Q12H when okay with NSGY Dispo - Admit to inpatient--floor by IMS service for management of multiple chronic medical problems  Jesusita Oka, MD General and [facility]

## 2019-10-13 NOTE — ED Notes (Signed)
Barry Lewis daughter [phone] call with any updates on the patient

## 2019-10-13 NOTE — H&P (Signed)
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 at the superior endplate of T3, somewhat age indeterminate, but new from previous, and could be acute to subacute in nature. No bony retropulsion. Correlation with physical exam recommended. 2. Nondisplaced fractures of the right transverse processes of T1, T2, and T3, also new from previous, and could be acute to subacute in nature. 3. Chronic compression deformities at the superior endplates of T2 and T4, stable. 4. Large right greater than left pleural effusions with associated atelectasis and/or consolidation, better evaluated on concomitant chest CT. Electronically Signed   By: Jeannine Boga M.D.   On: 10/13/2019 03:54   CT L-SPINE NO CHARGE  Result Date: 10/13/2019 CLINICAL DATA:  Initial evaluation for acute trauma, fall. EXAM: CT LUMBAR SPINE WITHOUT CONTRAST TECHNIQUE: Multidetector CT imaging of the lumbar spine was performed without intravenous contrast administration. Multiplanar CT image reconstructions were also generated. COMPARISON:  Prior CT from 04/26/2019. FINDINGS: Segmentation: Standard. Lowest well-formed disc space labeled the L5-S1 level. Alignment: Physiologic with preservation of the normal lumbar lordosis. No subluxation or malalignment.  Vertebrae: Vertebral body height maintained without evidence for acute or chronic fracture. Visualized sacrum and pelvis intact. SI joints approximated symmetric. No discrete osseous lesions. Paraspinal and other soft tissues: Paraspinous soft tissues within normal limits. Disc levels: L1-2:  Unremarkable. L2-3:  Negative interspace. Mild facet hypertrophy. No stenosis. L3-4: Chronic intervertebral disc space narrowing with diffuse disc bulge. Mild facet hypertrophy. Resultant mild-to-moderate spinal stenosis. Moderate right with mild left foraminal narrowing. L4-5: Mild disc bulge. Bilateral facet hypertrophy. No significant stenosis. L5-S1:  Minimal disc bulge. No stenosis. IMPRESSION: 1. No acute traumatic injury within the lumbar spine. 2. Degenerative disc bulge with facet hypertrophy at L3-4 with resultant mild to moderate spinal stenosis. Electronically Signed   By: Jeannine Boga M.D.   On: 10/13/2019 04:05   DG Chest Port 1 View  Result Date: 10/13/2019 CLINICAL DATA:  Fall EXAM: PORTABLE CHEST 1 VIEW COMPARISON:  06/11/2019 FINDINGS: Cardiomegaly. Probable scarring in the left lung, similar to prior study. Right lung clear. No effusions. Multiple old left rib fractures. There appear to be acute lateral left rib fractures of the 5th through 7th ribs. No pneumothorax. IMPRESSION: Multiple old left rib fractures. There appear to be acute superimposed fractures in the lateral left 5th through 7th ribs. Chronic opacities in the left lung, likely scarring. Mild cardiomegaly. Electronically Signed   By: Rolm Baptise M.D.   On: 10/13/2019 02:34   DG Cerv Spine Flex&Ext Only  Result Date: 10/13/2019 CLINICAL DATA:  Fall.  Back pain. EXAM: CERVICAL SPINE - FLEXION AND EXTENSION VIEWS ONLY COMPARISON:  CT 10/13/2019. FINDINGS: C6 and C7 not completely visualized. Mild prevertebral soft tissue swelling cannot be excluded. Diffuse osteopenia and degenerative change present. No evidence of flexion or  extension instability. Reference is made to CT for discussion of cervical and thoracic fractures present. IMPRESSION: 1.  Mild prevertebral soft tissue swelling cannot be excluded. 2.  C6 and C7 not completely visualized. No flexion or extension instability. Reference is made to CT for discussion of cervical and thoracic fractures present. 3.  Diffuse osteopenia and degenerative change. Electronically Signed   By: Marcello Moores  Register   On: 10/13/2019 07:54    EKG: not done   Labs on Admission: I have personally reviewed the available labs and imaging studies at the time of the admission.  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[place])   Rhabdomyolysis following fall, AKI on stage IV CKD -CK 649 -Likely due to fall and inability to get up for >24 hours without PO intake -Acidosis appears to be due to dehydration in this setting rather than DKA -Will admit with aggressive IVF at 150 cc/hour -Hepatic function panel, CK, and CMP q12h -Myoglobin (blood) -CBC qAM -Heparin for DVT prophylaxis -Push PO fluids if able -Strict I/Os -Hepatic and renal injuries are usually reversible with aggressive rehydration but will need ongoing monitoring  Trauma with multiple orthopedic injuries -Trauma service has consulted and recommended neurosurgery evaluation; SLP evaluation; and ortho consult -Ortho has consulted and recommends WBAT with his LUE -Neurosurgery recommends  removing the C-collar and will see him again Monday  Chronic combined CHF -last echo was at [facility] in 04/2019 - EF 45-50% and grade 1 diastolic dysfunction -Appears to be compensated at this time -Will need to monitor while he is receiving aggressive IVF  HTN -Hold Lisinopril -Will follow without medication for now  COPD -He does not appear to be taking medications for this issue  Dementia/bipolar -Continue home meds - Carbamazepine, Zyprexa, Trazadone, and Effexor -Home health safety evaluation needs consideration prior to determination of disposition  DM -Will check A1c - 8.5, indicating poor control -hold [place] -Cover with moderate-scale SSI     Note: This patient has been tested and is negative for the novel coronavirus COVID-19.  DVT prophylaxis: Heparin Code Status:  Full - confirmed with patient Family Communication: None present; ER staff spoke with his daughter Disposition Plan:  To be determined Consults called: Trauma; Orthopedics; Neurosurgery; PT/OT/ST/TOC team Admission status: Admit - It is my clinical opinion that admission to INPATIENT is reasonable and necessary because of the expectation that this patient will require hospital care that crosses at least 2 midnights to treat this condition based on the medical complexity of the problems presented.  Given the aforementioned information, the predictability of an adverse outcome is felt to be significant.    Karmen Bongo MD [facility]   How to contact the [facility] Attending or Consulting provider [place] or covering provider during after hours [place], for this patient?  1. Check the care team in [facility] and look for a) attending/consulting [facility] provider listed and b) the [facility] team listed 2. Log into [email or web address] and use [place]'s universal password to access. If you do not have the password, please contact the hospital operator. 3. Locate the [facility] provider you are looking for under [facility]  and  page to a number that you can be directly reached. 4. If you still have difficulty reaching the provider, please page the [facility] (Director on Call) for the Hospitalists listed on amion for assistance.   10/13/2019, 7:26 PM

## 2019-10-13 NOTE — Consult Note (Signed)
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 significant stenosis. L5-S1:  Minimal disc bulge. No stenosis. IMPRESSION: 1. No acute traumatic injury within the lumbar spine. 2. Degenerative disc bulge with facet hypertrophy at L3-4 with resultant mild to moderate spinal stenosis. Electronically Signed   By: Jeannine Boga M.D.   On: 10/13/2019 04:05   DG Chest Port 1 View  Result Date: 10/13/2019 CLINICAL DATA:  Fall EXAM: PORTABLE CHEST 1 VIEW COMPARISON:  06/11/2019 FINDINGS: Cardiomegaly. Probable scarring in the left lung, similar to prior study. Right lung clear. No effusions. Multiple old left rib fractures. There appear to be acute lateral left rib fractures of the 5th through 7th ribs. No pneumothorax. IMPRESSION: Multiple old left rib fractures. There appear to be acute superimposed fractures in the lateral left 5th through 7th ribs. Chronic opacities in the left lung, likely scarring. Mild cardiomegaly. Electronically Signed   By: Rolm Baptise M.D.   On: 10/13/2019 02:34   DG Cerv Spine Flex&Ext Only  Result Date: 10/13/2019 CLINICAL DATA:  Fall.  Back pain. EXAM: CERVICAL SPINE - FLEXION AND EXTENSION VIEWS ONLY COMPARISON:  CT 10/13/2019. FINDINGS: C6 and C7 not completely visualized. Mild prevertebral soft tissue swelling cannot be  excluded. Diffuse osteopenia and degenerative change present. No evidence of flexion or extension instability. Reference is made to CT for discussion of cervical and thoracic fractures present. IMPRESSION: 1.  Mild prevertebral soft tissue swelling cannot be excluded. 2. C6 and C7 not completely visualized. No flexion or extension instability. Reference is made to CT for discussion of cervical and thoracic fractures present. 3.  Diffuse osteopenia and degenerative change. Electronically Signed   By: Marcello Moores  Register   On: 10/13/2019 07:54  A/P Mr. Cobbs has an acute fracture at T3. There is no canal involvement, and his exam is benign. There is no good way to brace the upper thoracic region, nor does his injury require bracing. I will follow and see on Monday, if questions you may contact the neurosurgical team this weekend. I agree with removing the cervical collar. No evidence of acute injury on ct.
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
## 2019-10-13 NOTE — ED Notes (Signed)
Pt transported to xray 
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
## 2019-10-13 NOTE — ED Notes (Signed)
This tech held urinal in front of pt to urinate. Pt unable to urinate at this time.
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
## 2019-10-13 NOTE — ED Notes (Signed)
Pt transported to CT ?

## 2019-10-13 NOTE — Progress Notes (Signed)
PT Cancellation Note  Patient Details Name: Barry Lewis MRN: [id number] DOB: 1961-08-12   Cancelled Treatment:    Reason Eval/Treat Not Completed: Medical issues which prohibited therapy Awaiting neurosurgery consult for new spinal fractures. Will follow up as schedule allows and as pt appropriate.   Reuel Derby, PT, DPT  Acute Rehabilitation Services  Pager: [phone] Office: [phone]    Rudean Hitt 10/13/2019, 12:01 PM

## 2019-10-14 ENCOUNTER — Inpatient Hospital Stay (HOSPITAL_COMMUNITY): Payer: Medicare Other

## 2019-10-14 ENCOUNTER — Encounter (HOSPITAL_COMMUNITY): Payer: Self-pay | Admitting: Internal Medicine

## 2019-10-14 DIAGNOSIS — R601 Generalized edema: Secondary | ICD-10-CM

## 2019-10-14 LAB — BLOOD GAS, ARTERIAL
Acid-base deficit: 9.6 mmol/L — ABNORMAL HIGH (ref 0.0–2.0)
Bicarbonate: 15.6 mmol/L — ABNORMAL LOW (ref 20.0–28.0)
Drawn by: 519031
FIO2: 21
O2 Saturation: 94.7 %
Patient temperature: 36.6
pCO2 arterial: 32.5 mmHg (ref 32.0–48.0)
pH, Arterial: 7.3 — ABNORMAL LOW (ref 7.350–7.450)
pO2, Arterial: 74.6 mmHg — ABNORMAL LOW (ref 83.0–108.0)

## 2019-10-14 LAB — CBC
HCT: 31.4 % — ABNORMAL LOW (ref 39.0–52.0)
Hemoglobin: 10.3 g/dL — ABNORMAL LOW (ref 13.0–17.0)
MCH: 32.5 pg (ref 26.0–34.0)
MCHC: 32.8 g/dL (ref 30.0–36.0)
MCV: 99.1 fL (ref 80.0–100.0)
Platelets: 296 10*3/uL (ref 150–400)
RBC: 3.17 MIL/uL — ABNORMAL LOW (ref 4.22–5.81)
RDW: 15.1 % (ref 11.5–15.5)
WBC: 8 10*3/uL (ref 4.0–10.5)
nRBC: 0 % (ref 0.0–0.2)

## 2019-10-14 LAB — MYOGLOBIN, SERUM: Myoglobin: 271 ng/mL — ABNORMAL HIGH (ref 28–72)

## 2019-10-14 LAB — BASIC METABOLIC PANEL
Anion gap: 11 (ref 5–15)
BUN: 52 mg/dL — ABNORMAL HIGH (ref 6–20)
CO2: 17 mmol/L — ABNORMAL LOW (ref 22–32)
Calcium: 7.1 mg/dL — ABNORMAL LOW (ref 8.9–10.3)
Chloride: 107 mmol/L (ref 98–111)
Creatinine, Ser: 4.45 mg/dL — ABNORMAL HIGH (ref 0.61–1.24)
GFR calc Af Amer: 16 mL/min — ABNORMAL LOW (ref >60)
GFR calc non Af Amer: 14 mL/min — ABNORMAL LOW (ref >60)
Glucose, Bld: 100 mg/dL — ABNORMAL HIGH (ref 70–99)
Potassium: 4.3 mmol/L (ref 3.5–5.1)
Sodium: 135 mmol/L (ref 135–145)

## 2019-10-14 LAB — RAPID URINE DRUG SCREEN, HOSP PERFORMED
Amphetamines: NOT DETECTED
Barbiturates: NOT DETECTED
Benzodiazepines: NOT DETECTED
Cocaine: NOT DETECTED
Opiates: NOT DETECTED
Tetrahydrocannabinol: NOT DETECTED

## 2019-10-14 LAB — CK: Total CK: 177 U/L (ref 49–397)

## 2019-10-14 LAB — COMPREHENSIVE METABOLIC PANEL
ALT: 69 U/L — ABNORMAL HIGH (ref 0–44)
AST: 26 U/L (ref 15–41)
Albumin: 1.6 g/dL — ABNORMAL LOW (ref 3.5–5.0)
Alkaline Phosphatase: 122 U/L (ref 38–126)
Anion gap: 10 (ref 5–15)
BUN: 53 mg/dL — ABNORMAL HIGH (ref 6–20)
CO2: 15 mmol/L — ABNORMAL LOW (ref 22–32)
Calcium: 6.7 mg/dL — ABNORMAL LOW (ref 8.9–10.3)
Chloride: 104 mmol/L (ref 98–111)
Creatinine, Ser: 4.46 mg/dL — ABNORMAL HIGH (ref 0.61–1.24)
GFR calc Af Amer: 16 mL/min — ABNORMAL LOW (ref >60)
GFR calc non Af Amer: 14 mL/min — ABNORMAL LOW (ref >60)
Glucose, Bld: 349 mg/dL — ABNORMAL HIGH (ref 70–99)
Potassium: 4.5 mmol/L (ref 3.5–5.1)
Sodium: 129 mmol/L — ABNORMAL LOW (ref 135–145)
Total Bilirubin: 0.4 mg/dL (ref 0.3–1.2)
Total Protein: 5.3 g/dL — ABNORMAL LOW (ref 6.5–8.1)

## 2019-10-14 LAB — GLUCOSE, CAPILLARY
Glucose-Capillary: 301 mg/dL — ABNORMAL HIGH (ref 70–99)
Glucose-Capillary: 245 mg/dL — ABNORMAL HIGH (ref 70–99)
Glucose-Capillary: 75 mg/dL (ref 70–99)
Glucose-Capillary: 82 mg/dL (ref 70–99)

## 2019-10-14 LAB — SODIUM, URINE, RANDOM: Sodium, Ur: 56 mmol/L

## 2019-10-14 LAB — CREATININE, URINE, RANDOM: Creatinine, Urine: 25.89 mg/dL

## 2019-10-14 MED ORDER — THIAMINE HCL 100 MG/ML IJ SOLN
100.0000 mg | Freq: Every day | INTRAMUSCULAR | Status: DC
  Administered 2019-10-14 – 2019-10-16 (×3): 100 mg via INTRAVENOUS
  Filled 2019-10-14 (×3): qty 2

## 2019-10-14 MED ORDER — INSULIN GLARGINE 100 UNIT/ML ~~LOC~~ SOLN
15.0000 [IU] | Freq: Every day | SUBCUTANEOUS | Status: DC
  Administered 2019-10-14: 11:00:00 15 [IU] via SUBCUTANEOUS
  Filled 2019-10-14: qty 0.15

## 2019-10-14 MED ORDER — HYDROCERIN EX CREA
TOPICAL_CREAM | Freq: Two times a day (BID) | CUTANEOUS | Status: DC
  Administered 2019-10-15: 1 via TOPICAL
  Filled 2019-10-14: qty 113

## 2019-10-14 MED ORDER — OLANZAPINE 5 MG PO TABS
5.0000 mg | ORAL_TABLET | Freq: Every day | ORAL | Status: DC
  Administered 2019-10-15 – 2019-10-17 (×3): 5 mg via ORAL
  Filled 2019-10-14 (×3): qty 1

## 2019-10-14 MED ORDER — HYDRALAZINE HCL 25 MG PO TABS
25.0000 mg | ORAL_TABLET | Freq: Three times a day (TID) | ORAL | Status: DC
  Filled 2019-10-14: qty 1

## 2019-10-14 MED ORDER — FOLIC ACID 5 MG/ML IJ SOLN
1.0000 mg | Freq: Every day | INTRAMUSCULAR | Status: DC
  Administered 2019-10-15: 1 mg via INTRAVENOUS
  Filled 2019-10-14 (×4): qty 0.2

## 2019-10-14 MED ORDER — STERILE WATER FOR INJECTION IV SOLN
INTRAVENOUS | Status: DC
  Filled 2019-10-14: qty 9.71

## 2019-10-14 MED ORDER — FUROSEMIDE 10 MG/ML IJ SOLN
80.0000 mg | Freq: Three times a day (TID) | INTRAMUSCULAR | Status: DC
  Administered 2019-10-14 – 2019-10-15 (×3): 80 mg via INTRAVENOUS
  Filled 2019-10-14 (×3): qty 8

## 2019-10-14 MED ORDER — HYDRALAZINE HCL 20 MG/ML IJ SOLN
10.0000 mg | Freq: Three times a day (TID) | INTRAMUSCULAR | Status: DC | PRN
  Administered 2019-10-14 – 2019-10-15 (×2): 10 mg via INTRAVENOUS
  Filled 2019-10-14 (×2): qty 1

## 2019-10-14 MED ORDER — ACETAMINOPHEN 10 MG/ML IV SOLN
1000.0000 mg | Freq: Once | INTRAVENOUS | Status: AC | PRN
  Administered 2019-10-14 – 2019-10-15 (×3): 1000 mg via INTRAVENOUS
  Filled 2019-10-14 (×4): qty 100

## 2019-10-14 MED ORDER — CHLORHEXIDINE GLUCONATE CLOTH 2 % EX PADS
6.0000 | MEDICATED_PAD | Freq: Every day | CUTANEOUS | Status: DC
  Administered 2019-10-14 – 2019-10-17 (×4): 6 via TOPICAL

## 2019-10-14 MED ORDER — DEXTROSE 50 % IV SOLN
INTRAVENOUS | Status: AC
  Administered 2019-10-14: 17:00:00 50 mL
  Filled 2019-10-14: qty 50

## 2019-10-14 NOTE — Progress Notes (Signed)
Pt states no pain at present.  Feeling relief with foley catheter.  Respiratory rate 12. BP 158/78.
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
## 2019-10-14 NOTE — Progress Notes (Signed)
700 cc urine returned right away in foley, clear yellow with no odor.

## 2019-10-14 NOTE — Progress Notes (Signed)
PROGRESS NOTE    Barry Lewis  [id number] DOB: [birth year] DOA: 10/13/2019 PCP: Patient, No Pcp Per   Brief Narrative: 59 year old BPM for CVA, schizoaffective bipolar disorder, polysubstance abuse, hyperlipidemia, diabetes, dementia, COPD, diastolic heart failure, chronic back pain presenting after a fall.  Patient fell and he was found more than 24 hours after.  He was in his bathroom and he is slipped.  He was not able to get up.  He called his daughter and she called EMS.  Per daughter he might still be doing drugs.  She relate that he has not been drinking alcohol. Patient was found to have multiple traumatic injuries, ribs, thoracic compression fracture.  Trauma and neurosurgery following.  Patient was found to have acute on chronic renal failure.    Assessment & Plan:   Principal Problem:   Rhabdomyolysis Active Problems:   Chronic diastolic (congestive) heart failure ([place])   Fall at home, initial encounter   Hypertension   Dyslipidemia   Diabetes mellitus without complication (HCC)   Dementia ([place])   COPD (chronic obstructive pulmonary disease) ([place])   CKD stage 4 secondary to hypertension ([place])  1-Acute on chronic renal failure stage IV: Prior creatinine per records 2.8--3.0. Patient also had urine retention, after Foley catheter placed he had 700 cc of urine CK mildly elevated at 649. Continue with IV fluids, changed to bicarb drip due to metabolic acidosis and altered mental status. CK level trending down. Nephrology consulted.  2-Mild  rhabdomyolysis: Fall.  CK trending down.  Continue with IV fluids.  3-Metabolic acidosis: In the setting of renal failure.  Start bicarb drip.  4-Acute metabolic encephalopathy: In the setting of metabolic acidosis. Patient very lethargic this morning eyes to voice and fall back to sleep blood gas showed a pH of 7.3, mild hypoxemia. CCM consulted , some for airway protection if metabolic acidosis continues to worsen. Transfer to  stepdown unit. N.p.o. status.  5-Trauma with multiple orthopedic injuries: Bilateral rib fractures, right scapular fracture. Age indeterminate fractures involving the T3 as well as the T1 and T2 right transverse processes. Ortho recommended weightbearing as tolerated on his left upper extremity for scapular fracture Neurosurgery recommends removing the c-collar.  They will follow up on patient on Monday to again.  Left pleural effusion;  Monitor for now, might need thoracentesis.   Destructive process involving Sternum;  Could be old fracture, vs infection, vs mass.  He will need MRI to further evaluate.    Right foot plantar aspect callus, necrosis. Per nurse was draining. Will get X ray and will inform ortho.  Appreciate wound care evaluation.   Combined systolic and diastolic heart failure: Last echo at [facility] 04/2019 ejection fraction 45 to [id number] and diastolic dysfunction.  Monitor on IV fluids.  Hypertension: Hold lisinopril.  Start hydralazine continue Norvasc  Dementia bipolar, hold sedative patient lethargic Will resume Zyprexa now he is more alert this afternoon.    Diabetes: Continue with Lantus, sliding scale insulin.  Hold Tyler Aas  Estimated body mass index is 31.14 kg/m as calculated from the following:   Height as of this encounter: 5\' 6"  (1.676 m).   Weight as of this encounter: 87.5 kg.   DVT prophylaxis: Heparin Code Status: Full code Family Communication; daughter update Disposition Plan:  From home Place to be discharge; to be determine.  Remain in the hospital for acute Metabolic encephalopathy, AKI.   Consultants:  CCM Sx Ortho Renal Procedures:   none  Antimicrobials:    Subjective: Lethargic, open eyes  to voice. Fall back sleep  Objective: Vitals:   10/13/19 1309 10/13/19 2157 10/14/19 0546 10/14/19 0600  BP: (!) 148/96 (!) 156/83 (!) 191/105 (!) 190/95  Pulse: 82 76 86   Resp: 17 15 16    Temp: 97.7 F (36.5 C) 98.2 F (36.8 C) 97.9 F  (36.6 C)   TempSrc: Oral Oral Oral   SpO2: 99% 99% 98%   Weight:      Height:        Intake/Output Summary (Last 24 hours) at 10/14/2019 0747 Last data filed at 10/14/2019 0556 Gross per 24 hour  Intake 3404.5 ml  Output 550 ml  Net 2854.5 ml   Filed Weights   10/13/19 0151  Weight: 87.5 kg    Examination:  General exam: Lethargic Respiratory system: Clear to auscultation. Respiratory effort normal. Cardiovascular system: S1 & S2 heard, RRR. No JVD, murmurs, rubs, gallops or clicks. No pedal edema. Gastrointestinal system: Abdomen is nondistended, soft and nontender. No organomegaly or masses felt. Normal bowel sounds heard. Central nervous system: lethargic Extremities: Symmetric 5 x 5 power. Skin: No rashes, lesions or ulcers    Data Reviewed: I have personally reviewed following labs and imaging studies  CBC: Recent Labs  Lab 10/13/19 0237 10/14/19 0554  WBC 8.7 8.0  HGB 12.2* 10.3*  HCT 36.1* 31.4*  MCV 96.8 99.1  PLT 340 0000000   Basic Metabolic Panel: Recent Labs  Lab 10/13/19 0237 10/13/19 0922 10/13/19 1613 10/14/19 0554  NA 133* 135 132* 129*  K 5.0 4.8 4.9 4.5  CL 105 110 105 104  CO2 16* 14* 16* 15*  GLUCOSE 306* 242* 229* 349*  BUN 62* 59* 59* 53*  CREATININE 5.06* 4.80* 4.77* 4.46*  CALCIUM 7.1* 7.1* 7.1* 6.7*   GFR: Estimated Creatinine Clearance: 19.2 mL/min (A) (by C-G formula based on SCr of 4.46 mg/dL (H)). Liver Function Tests: Recent Labs  Lab 10/13/19 0237 10/13/19 0922 10/13/19 1613 10/14/19 0554  AST 61* 38 36 26  ALT 115* 91* 88* 69*  ALKPHOS 138* 117 118 122  BILITOT 0.6 0.5 0.4 0.4  PROT 6.4* 5.4* 5.6* 5.3*  ALBUMIN 2.0* 1.7* 1.7* 1.6*   No results for input(s): LIPASE, AMYLASE in the last 168 hours. No results for input(s): AMMONIA in the last 168 hours. Coagulation Profile: Recent Labs  Lab 10/13/19 0237  INR 1.0   Cardiac Enzymes: Recent Labs  Lab 10/13/19 0237 10/13/19 1613 10/14/19 0554  CKTOTAL 649*  262 177   BNP (last 3 results) No results for input(s): PROBNP in the last 8760 hours. HbA1C: Recent Labs    10/13/19 0922  HGBA1C 8.5*   CBG: Recent Labs  Lab 10/13/19 0539 10/13/19 1057 10/13/19 1643 10/13/19 2154  GLUCAP 259* 262* 198* 257*   Lipid Profile: No results for input(s): CHOL, HDL, LDLCALC, TRIG, CHOLHDL, LDLDIRECT in the last 72 hours. Thyroid Function Tests: No results for input(s): TSH, T4TOTAL, FREET4, T3FREE, THYROIDAB in the last 72 hours. Anemia Panel: No results for input(s): VITAMINB12, FOLATE, FERRITIN, TIBC, IRON, RETICCTPCT in the last 72 hours. Sepsis Labs: Recent Labs  Lab 10/13/19 0238  LATICACIDVEN 1.1    Recent Results (from the past 240 hour(s))  SARS CORONAVIRUS 2 (TAT 6-24 HRS) Nasopharyngeal Nasopharyngeal Swab     Status: None   Collection Time: 10/13/19  2:38 AM   Specimen: Nasopharyngeal Swab  Result Value Ref Range Status   SARS Coronavirus 2 NEGATIVE NEGATIVE Final    Comment: (NOTE) SARS-CoV-2 target nucleic acids are NOT  DETECTED. The SARS-CoV-2 RNA is generally detectable in upper and lower respiratory specimens during the acute phase of infection. Negative results do not preclude SARS-CoV-2 infection, do not rule out co-infections with other pathogens, and should not be used as the sole basis for treatment or other patient management decisions. Negative results must be combined with clinical observations, patient history, and epidemiological information. The expected result is Negative. Fact Sheet for Patients: SugarRoll.be Fact Sheet for Healthcare Providers: https://www.woods-mathews.com/ This test is not yet approved or cleared by the [place] FDA and  has been authorized for detection and/or diagnosis of SARS-CoV-2 by FDA under an Emergency Use Authorization (EUA). This EUA will remain  in effect (meaning this test can be used) for the duration of the COVID-19  declaration under Section 56 4(b)(1) of the Act, 21 U.S.C. section 360bbb-3(b)(1), unless the authorization is terminated or revoked sooner. Performed at [facility], [place] [address], [place], [place] [address]          Radiology Studies: CT ABDOMEN PELVIS WO CONTRAST  Result Date: 10/13/2019 CLINICAL DATA:  Initial evaluation for acute trauma, fall. EXAM: CT CHEST, ABDOMEN AND PELVIS WITHOUT CONTRAST TECHNIQUE: Multidetector CT imaging of the chest, abdomen and pelvis was performed following the standard protocol without IV contrast. COMPARISON:  Comparison made with prior CT from 04/26/2019. FINDINGS: CT CHEST FINDINGS Cardiovascular: Intrathoracic aorta normal in caliber without aneurysm. Scattered atheromatous change noted about the arch and origin of the great vessels as well as within the descending intrathoracic aorta. Cardiomegaly. Scattered 3 vessel coronary artery calcifications. No pericardial effusion. Mediastinum/Nodes: Coarse calcification noted within the left lobe of thyroid, of doubtful significance. Visualized thyroid otherwise unremarkable. No pathologically enlarged mediastinal or hilar lymph nodes identified on this noncontrast examination. No axillary adenopathy. Esophagus within normal limits. No mediastinal hematoma. Lungs/Pleura: Tracheobronchial tree intact and patent. Lungs hypoinflated with elevation of the right hemidiaphragm. Moderate to large right greater than left pleural effusions. These measure simple fluid density with no evidence for hemothorax. Superimposed ground-glass opacity in interlobular septal thickening elsewhere within the aerated portions of the lungs, suggesting a degree of pulmonary interstitial edema. Superimposed scattered linear and parenchymal opacity within the dependent aspects of both lungs, likely atelectasis. Infection difficult to exclude. No pneumothorax. Musculoskeletal: Mild diffuse anasarca noted within the external soft tissues.  There is an acute fracture of the left scapular wing (series 7, image 49). There is an acute fracture of the left anterolateral third rib, lateral left fifth and sixth ribs. Additional acute appearing fracture of the left posterolateral eleventh rib (series 7, image 164). On the right, there are acute nondisplaced fractures of the right lateral 6 and likely seventh ribs, as well as the posterior ninth and tenth ribs. Multiple underlying chronic bilateral rib fractures noted. There is new destructive process involving the superior aspect of the sternum (series 6, image 70). Finding suspected to reflect a previous/remote fracture with osseous remodeling. No significant surrounding soft tissue stranding identified. Age indeterminate fractures involving T3 and the right transverse processes of T1 and T2 noted, better evaluated on concomitant CT of the thoracic spine. CT ABDOMEN PELVIS FINDINGS Hepatobiliary: Limited noncontrast evaluation of the liver is grossly unremarkable. Multiple calcified stones noted within the gallbladder lumen. No evidence for acute cholecystitis. No biliary dilatation. Pancreas: Pancreas demonstrates no acute abnormality. Few scattered calcific densities at the pancreatic head and body favored to be vascular in nature. Spleen: Spleen grossly intact and without acute abnormality on this noncontrast examination. Dystrophic calcifications  in contour deformity along the lateral margin again noted, stable. Adrenals/Urinary Tract: Adrenal glands within normal limits. Kidneys equal in size. No nephrolithiasis. No hydronephrosis. Mild scattered perinephric stranding noted about the kidneys bilaterally, nonspecific, but similar to previous. No hydroureter. Bladder moderately distended without acute abnormality. Stomach/Bowel: Stomach within normal limits. No evidence for bowel obstruction. Negative appendix. Large volume retained stool seen within the colon and rectal vault, suggesting constipation. No  acute inflammatory changes seen about the bowels. Vascular/Lymphatic: Vascular patency not evaluated given lack of IV contrast. Intra-abdominal aorta of normal caliber. Mild atherosclerotic change. No adenopathy. Reproductive: Prostate and seminal vesicles within normal limits. Other: No free air or fluid. No mesenteric or retroperitoneal hematoma. Musculoskeletal: No acute fracture within the pelvis. No discrete lytic or blastic osseous lesions. Diffuse anasarca seen within the external soft tissues. Asymmetric soft tissue stranding within the subcutaneous fat of the lateral left flank could reflect mild contusion (series 7, image 188). IMPRESSION: CT CHEST 1. Acute bilateral rib fractures as above. No evidence for hemothorax or pneumothorax. 2. Acute fracture of the left scapular wing. 3. Age indeterminate fractures involving the T3 as well as the T1 and T2 right transverse processes, better evaluated on concomitant CT of the thoracic spine. 4. Moderate to large right greater than left pleural effusions with de pendant opacity, likely atelectasis. Superimposed infection difficult to exclude, and could be considered in the correct clinical setting. Superimposed ground-glass opacity and interlobular septal thickening suggest a degree of pulmonary interstitial edema. 5. Destructive process involving the sternum, new from previous. Finding favored to reflect a remote fracture with associated osseous remodeling. Possible infection or even a destructive lytic mass not entirely excluded. Correlation with physical exam recommended. Additionally, further assessment with dedicated MRI could be performed as warranted. CT ABDOMEN AND PELVIS 1. Asymmetric soft tissue stranding within the subcutaneous fat of the lateral left flank, which may reflect mild contusion. 2. No other acute traumatic injury within the abdomen and pelvis. 3. Large volume retained stool within the colon, suggesting constipation. 4. Cholelithiasis. 5.  Diffuse anasarca, likely related overall volume status. Electronically Signed   By: Jeannine Boga M.D.   On: 10/13/2019 05:20   CT HEAD WO CONTRAST  Result Date: 10/13/2019 CLINICAL DATA:  Initial evaluation for acute trauma, fall. EXAM: CT HEAD WITHOUT CONTRAST CT CERVICAL SPINE WITHOUT CONTRAST TECHNIQUE: Multidetector CT imaging of the head and cervical spine was performed following the standard protocol without intravenous contrast. Multiplanar CT image reconstructions of the cervical spine were also generated. COMPARISON:  None available. FINDINGS: CT HEAD FINDINGS Brain: Generalized age-related cerebral atrophy with chronic microvascular ischemic disease. No acute intracranial hemorrhage. No acute large vessel territory infarct. No mass lesion, midline shift or mass effect. No hydrocephalus. No extra-axial fluid collection. Vascular: No hyperdense vessel. Scattered vascular calcifications noted within the carotid siphons. Skull: Scalp soft tissues demonstrate no acute finding. Calvarium intact. Sinuses/Orbits: Globes and orbital soft tissues within normal limits. Mild mucosal thickening noted within the ethmoidal air cells and maxillary sinuses. Few scattered dental caries noted. No mastoid effusion. Other: None. CT CERVICAL SPINE FINDINGS Alignment: Vertebral bodies normally aligned with preservation of the normal cervical lordosis. No listhesis. Skull base and vertebrae: Skull base intact. Normal C1-2 articulations are preserved in the dens is intact. There is minimal height loss at the superior endplate of C7, age indeterminate (series 6, image 31). Vertebral body height otherwise maintained. Additional age-indeterminate fractures of the right transverse processes of T1 and T2. Remote fracture of the  T1 spinous process noted. No other acute fracture identified. Soft tissues and spinal canal: Paraspinous soft tissues demonstrate no acute finding. No significant prevertebral edema. Vascular  calcifications about the carotid bifurcations. Disc levels: Mild multilevel degenerative disc bulging, most notable at C3-4. Left-sided facet arthrosis noted at C2-3. Upper chest: Bilateral pleural effusions noted within the visualized lung apices, better evaluated on concomitant CT of the chest. Other: None. IMPRESSION: CT BRAIN: 1. No acute intracranial abnormality. 2. Generalized age-related cerebral atrophy with mild chronic small vessel ischemic disease. CT CERVICAL SPINE: 1. Mild height loss at the superior endplate of C7, age indeterminate. Correlation with physical exam for possible pain at this location recommended. Additionally, finding could be further assessed with dedicated MRI as clinically warranted. 2. Nondisplaced fractures of the right transverse processes of T1 and T2, also somewhat age indeterminate. 3. Remote fracture of the T1 spinous process, stable from previous. Electronically Signed   By: Jeannine Boga M.D.   On: 10/13/2019 04:33   CT Chest Wo Contrast  Result Date: 10/13/2019 CLINICAL DATA:  Initial evaluation for acute trauma, fall. EXAM: CT CHEST, ABDOMEN AND PELVIS WITHOUT CONTRAST TECHNIQUE: Multidetector CT imaging of the chest, abdomen and pelvis was performed following the standard protocol without IV contrast. COMPARISON:  Comparison made with prior CT from 04/26/2019. FINDINGS: CT CHEST FINDINGS Cardiovascular: Intrathoracic aorta normal in caliber without aneurysm. Scattered atheromatous change noted about the arch and origin of the great vessels as well as within the descending intrathoracic aorta. Cardiomegaly. Scattered 3 vessel coronary artery calcifications. No pericardial effusion. Mediastinum/Nodes: Coarse calcification noted within the left lobe of thyroid, of doubtful significance. Visualized thyroid otherwise unremarkable. No pathologically enlarged mediastinal or hilar lymph nodes identified on this noncontrast examination. No axillary adenopathy. Esophagus  within normal limits. No mediastinal hematoma. Lungs/Pleura: Tracheobronchial tree intact and patent. Lungs hypoinflated with elevation of the right hemidiaphragm. Moderate to large right greater than left pleural effusions. These measure simple fluid density with no evidence for hemothorax. Superimposed ground-glass opacity in interlobular septal thickening elsewhere within the aerated portions of the lungs, suggesting a degree of pulmonary interstitial edema. Superimposed scattered linear and parenchymal opacity within the dependent aspects of both lungs, likely atelectasis. Infection difficult to exclude. No pneumothorax. Musculoskeletal: Mild diffuse anasarca noted within the external soft tissues. There is an acute fracture of the left scapular wing (series 7, image 49). There is an acute fracture of the left anterolateral third rib, lateral left fifth and sixth ribs. Additional acute appearing fracture of the left posterolateral eleventh rib (series 7, image 164). On the right, there are acute nondisplaced fractures of the right lateral 6 and likely seventh ribs, as well as the posterior ninth and tenth ribs. Multiple underlying chronic bilateral rib fractures noted. There is new destructive process involving the superior aspect of the sternum (series 6, image 70). Finding suspected to reflect a previous/remote fracture with osseous remodeling. No significant surrounding soft tissue stranding identified. Age indeterminate fractures involving T3 and the right transverse processes of T1 and T2 noted, better evaluated on concomitant CT of the thoracic spine. CT ABDOMEN PELVIS FINDINGS Hepatobiliary: Limited noncontrast evaluation of the liver is grossly unremarkable. Multiple calcified stones noted within the gallbladder lumen. No evidence for acute cholecystitis. No biliary dilatation. Pancreas: Pancreas demonstrates no acute abnormality. Few scattered calcific densities at the pancreatic head and body favored  to be vascular in nature. Spleen: Spleen grossly intact and without acute abnormality on this noncontrast examination. Dystrophic calcifications in contour deformity along  the lateral margin again noted, stable. Adrenals/Urinary Tract: Adrenal glands within normal limits. Kidneys equal in size. No nephrolithiasis. No hydronephrosis. Mild scattered perinephric stranding noted about the kidneys bilaterally, nonspecific, but similar to previous. No hydroureter. Bladder moderately distended without acute abnormality. Stomach/Bowel: Stomach within normal limits. No evidence for bowel obstruction. Negative appendix. Large volume retained stool seen within the colon and rectal vault, suggesting constipation. No acute inflammatory changes seen about the bowels. Vascular/Lymphatic: Vascular patency not evaluated given lack of IV contrast. Intra-abdominal aorta of normal caliber. Mild atherosclerotic change. No adenopathy. Reproductive: Prostate and seminal vesicles within normal limits. Other: No free air or fluid. No mesenteric or retroperitoneal hematoma. Musculoskeletal: No acute fracture within the pelvis. No discrete lytic or blastic osseous lesions. Diffuse anasarca seen within the external soft tissues. Asymmetric soft tissue stranding within the subcutaneous fat of the lateral left flank could reflect mild contusion (series 7, image 188). IMPRESSION: CT CHEST 1. Acute bilateral rib fractures as above. No evidence for hemothorax or pneumothorax. 2. Acute fracture of the left scapular wing. 3. Age indeterminate fractures involving the T3 as well as the T1 and T2 right transverse processes, better evaluated on concomitant CT of the thoracic spine. 4. Moderate to large right greater than left pleural effusions with de pendant opacity, likely atelectasis. Superimposed infection difficult to exclude, and could be considered in the correct clinical setting. Superimposed ground-glass opacity and interlobular septal  thickening suggest a degree of pulmonary interstitial edema. 5. Destructive process involving the sternum, new from previous. Finding favored to reflect a remote fracture with associated osseous remodeling. Possible infection or even a destructive lytic mass not entirely excluded. Correlation with physical exam recommended. Additionally, further assessment with dedicated MRI could be performed as warranted. CT ABDOMEN AND PELVIS 1. Asymmetric soft tissue stranding within the subcutaneous fat of the lateral left flank, which may reflect mild contusion. 2. No other acute traumatic injury within the abdomen and pelvis. 3. Large volume retained stool within the colon, suggesting constipation. 4. Cholelithiasis. 5. Diffuse anasarca, likely related overall volume status. Electronically Signed   By: Jeannine Boga M.D.   On: 10/13/2019 05:20   CT CERVICAL SPINE WO CONTRAST  Result Date: 10/13/2019 CLINICAL DATA:  Initial evaluation for acute trauma, fall. EXAM: CT HEAD WITHOUT CONTRAST CT CERVICAL SPINE WITHOUT CONTRAST TECHNIQUE: Multidetector CT imaging of the head and cervical spine was performed following the standard protocol without intravenous contrast. Multiplanar CT image reconstructions of the cervical spine were also generated. COMPARISON:  None available. FINDINGS: CT HEAD FINDINGS Brain: Generalized age-related cerebral atrophy with chronic microvascular ischemic disease. No acute intracranial hemorrhage. No acute large vessel territory infarct. No mass lesion, midline shift or mass effect. No hydrocephalus. No extra-axial fluid collection. Vascular: No hyperdense vessel. Scattered vascular calcifications noted within the carotid siphons. Skull: Scalp soft tissues demonstrate no acute finding. Calvarium intact. Sinuses/Orbits: Globes and orbital soft tissues within normal limits. Mild mucosal thickening noted within the ethmoidal air cells and maxillary sinuses. Few scattered dental caries noted. No  mastoid effusion. Other: None. CT CERVICAL SPINE FINDINGS Alignment: Vertebral bodies normally aligned with preservation of the normal cervical lordosis. No listhesis. Skull base and vertebrae: Skull base intact. Normal C1-2 articulations are preserved in the dens is intact. There is minimal height loss at the superior endplate of C7, age indeterminate (series 6, image 52). Vertebral body height otherwise maintained. Additional age-indeterminate fractures of the right transverse processes of T1 and T2. Remote fracture of the T1 spinous process  noted. No other acute fracture identified. Soft tissues and spinal canal: Paraspinous soft tissues demonstrate no acute finding. No significant prevertebral edema. Vascular calcifications about the carotid bifurcations. Disc levels: Mild multilevel degenerative disc bulging, most notable at C3-4. Left-sided facet arthrosis noted at C2-3. Upper chest: Bilateral pleural effusions noted within the visualized lung apices, better evaluated on concomitant CT of the chest. Other: None. IMPRESSION: CT BRAIN: 1. No acute intracranial abnormality. 2. Generalized age-related cerebral atrophy with mild chronic small vessel ischemic disease. CT CERVICAL SPINE: 1. Mild height loss at the superior endplate of C7, age indeterminate. Correlation with physical exam for possible pain at this location recommended. Additionally, finding could be further assessed with dedicated MRI as clinically warranted. 2. Nondisplaced fractures of the right transverse processes of T1 and T2, also somewhat age indeterminate. 3. Remote fracture of the T1 spinous process, stable from previous. Electronically Signed   By: Jeannine Boga M.D.   On: 10/13/2019 04:33   CT T-SPINE NO CHARGE  Result Date: 10/13/2019 CLINICAL DATA:  Initial evaluation for acute trauma, fall. Back pain. EXAM: CT THORACIC SPINE WITHOUT CONTRAST TECHNIQUE: Multidetector CT images of the thoracic were obtained using the standard  protocol without intravenous contrast. COMPARISON:  Prior CT from 04/26/2019. FINDINGS: Alignment: Vertebral bodies normally aligned with preservation of the normal thoracic kyphosis. No listhesis. Vertebrae: Chronic height loss at the superior endplates of T2 and T4 is stable from previous exam. Remote fracture of the T1 spinous process also unchanged. Mild approximate 20% height loss at the superior endplate of T3, new from previous, and could be acute to subacute in nature. No bony retropulsion. Additionally, there are nondisplaced fractures of the right transverse processes of T1, T2, and T3, also new from previous, and could be acute to subacute in nature. Otherwise, vertebral body height maintained with no other acute fracture identified. No discrete osseous lesions. Multiple remotely healed rib fractures noted, stable. Paraspinal and other soft tissues: Paraspinous soft tissues demonstrate no acute finding. Large right greater than left pleural effusions with associated atelectasis and/or consolidation, better evaluated on concomitant chest CT. Disc levels: Mild scattered degenerative endplate spurring noted within the mid and lower thoracic spine. No appreciable spinal stenosis. IMPRESSION: 1. Mild approximate 20% height loss at the superior endplate of T3, somewhat age indeterminate, but new from previous, and could be acute to subacute in nature. No bony retropulsion. Correlation with physical exam recommended. 2. Nondisplaced fractures of the right transverse processes of T1, T2, and T3, also new from previous, and could be acute to subacute in nature. 3. Chronic compression deformities at the superior endplates of T2 and T4, stable. 4. Large right greater than left pleural effusions with associated atelectasis and/or consolidation, better evaluated on concomitant chest CT. Electronically Signed   By: Jeannine Boga M.D.   On: 10/13/2019 03:54   CT L-SPINE NO CHARGE  Result Date:  10/13/2019 CLINICAL DATA:  Initial evaluation for acute trauma, fall. EXAM: CT LUMBAR SPINE WITHOUT CONTRAST TECHNIQUE: Multidetector CT imaging of the lumbar spine was performed without intravenous contrast administration. Multiplanar CT image reconstructions were also generated. COMPARISON:  Prior CT from 04/26/2019. FINDINGS: Segmentation: Standard. Lowest well-formed disc space labeled the L5-S1 level. Alignment: Physiologic with preservation of the normal lumbar lordosis. No subluxation or malalignment. Vertebrae: Vertebral body height maintained without evidence for acute or chronic fracture. Visualized sacrum and pelvis intact. SI joints approximated symmetric. No discrete osseous lesions. Paraspinal and other soft tissues: Paraspinous soft tissues within normal limits. Disc  levels: L1-2:  Unremarkable. L2-3:  Negative interspace. Mild facet hypertrophy. No stenosis. L3-4: Chronic intervertebral disc space narrowing with diffuse disc bulge. Mild facet hypertrophy. Resultant mild-to-moderate spinal stenosis. Moderate right with mild left foraminal narrowing. L4-5: Mild disc bulge. Bilateral facet hypertrophy. No significant stenosis. L5-S1:  Minimal disc bulge. No stenosis. IMPRESSION: 1. No acute traumatic injury within the lumbar spine. 2. Degenerative disc bulge with facet hypertrophy at L3-4 with resultant mild to moderate spinal stenosis. Electronically Signed   By: Jeannine Boga M.D.   On: 10/13/2019 04:05   DG Chest Port 1 View  Result Date: 10/13/2019 CLINICAL DATA:  Fall EXAM: PORTABLE CHEST 1 VIEW COMPARISON:  06/11/2019 FINDINGS: Cardiomegaly. Probable scarring in the left lung, similar to prior study. Right lung clear. No effusions. Multiple old left rib fractures. There appear to be acute lateral left rib fractures of the 5th through 7th ribs. No pneumothorax. IMPRESSION: Multiple old left rib fractures. There appear to be acute superimposed fractures in the lateral left 5th through 7th  ribs. Chronic opacities in the left lung, likely scarring. Mild cardiomegaly. Electronically Signed   By: Rolm Baptise M.D.   On: 10/13/2019 02:34   DG Cerv Spine Flex&Ext Only  Result Date: 10/13/2019 CLINICAL DATA:  Fall.  Back pain. EXAM: CERVICAL SPINE - FLEXION AND EXTENSION VIEWS ONLY COMPARISON:  CT 10/13/2019. FINDINGS: C6 and C7 not completely visualized. Mild prevertebral soft tissue swelling cannot be excluded. Diffuse osteopenia and degenerative change present. No evidence of flexion or extension instability. Reference is made to CT for discussion of cervical and thoracic fractures present. IMPRESSION: 1.  Mild prevertebral soft tissue swelling cannot be excluded. 2. C6 and C7 not completely visualized. No flexion or extension instability. Reference is made to CT for discussion of cervical and thoracic fractures present. 3.  Diffuse osteopenia and degenerative change. Electronically Signed   By: Stevenson   On: 10/13/2019 07:54        Scheduled Meds: . amLODipine  10 mg Oral Daily  . carbamazepine  600 mg Oral QHS  . docusate sodium  100 mg Oral BID  . heparin  5,000 Units Subcutaneous Q8H  . hydrALAZINE  25 mg Oral Q8H  . insulin aspart  0-15 Units Subcutaneous TID WC  . insulin aspart  0-5 Units Subcutaneous QHS  . insulin glargine  32 Units Subcutaneous Daily  . OLANZapine  5 mg Oral QHS  . traZODone  200 mg Oral QHS  . venlafaxine XR  225 mg Oral Q breakfast   Continuous Infusions: .  sodium bicarbonate infusion 1/4 NS 1000 mL       LOS: 1 day    Time spent: 35 minutes.     Elmarie Shiley, MD [facility]   If 7PM-7AM, please contact night-coverage [email or web address] Password [facility] 10/14/2019, 7:47 AM

## 2019-10-14 NOTE — Progress Notes (Signed)
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 Wound length (cm): 2.8 Wound width (cm):2.5  Left knee wounds from proximal to distal: 1.6 cm x 1.6 cm 1.2 cm x 1 cm 1.4 cm x 1 cm  Right knee wound: 2 cm x 1.5 cm

## 2019-10-14 NOTE — Significant Event (Signed)
Rapid Response Event Note  I received a call from nursing staff to inform me that the patient might be transferred to a higher level of care. Staff informed that an ABG was obtained because the patient was lethargic and depending one the results patient might need to be transferred to PCU or ICU. I was with another patient in an emergency, the physician was the bedside with the nursing staff. I asked that the staff keep me informed of the plan. I called for an update and nursing staff informed me that they were okay. I did see later that the patient was moved to PCU.   Call RRT/RRRN as needed.   Victorine Mcnee R

## 2019-10-14 NOTE — Progress Notes (Signed)
RT NOTES: ABG obtained and sent to lab. Lab notified. 

## 2019-10-14 NOTE — Progress Notes (Addendum)
Bed weight was completed upon admission. Pt somnolent and unable to do standing weight.  Pt is currently in low bed, 2L nasal canula, and IVF.   Pt asking to eat and drink. currently NPO.

## 2019-10-14 NOTE — Progress Notes (Signed)
Patient ID: Barry Lewis, male   DOB: 1961/08/20, 59 y.o.   MRN: [id number] [facility] Surgery Progress Note:   * No surgery found *  Subjective: Mental status is arouseable and somewhat oriented.   Objective: Vital signs in last 24 hours: Temp:  [97.7 F (36.5 C)-98.2 F (36.8 C)] 97.9 F (36.6 C) (01/30 0546) Pulse Rate:  [74-86] 76 (01/30 0751) Resp:  [10-17] 16 (01/30 0546) BP: (129-191)/(76-105) 151/80 (01/30 0751) SpO2:  [92 %-100 %] 98 % (01/30 0546)  Intake/Output from previous day: 01/29 0701 - 01/30 0700 In: 4404.5 [P.O.:840; I.V.:2564.5; IV Piggyback:1000] Out: 550 [Urine:550] Intake/Output this shift: No intake/output data recorded.  Physical Exam: Work of breathing is not labored.  Not hurting BS equal with expiratory grunts.    Lab Results:  Results for orders placed or performed during the hospital encounter of 10/13/19 (from the past 48 hour(s))  Sample to Blood Bank     Status: None   Collection Time: 10/13/19  2:34 AM  Result Value Ref Range   Blood Bank Specimen SAMPLE AVAILABLE FOR TESTING    Sample Expiration      10/14/2019,2359 Performed at [facility], [place] [address], [place], [place] [address]   Comprehensive metabolic panel     Status: Abnormal   Collection Time: 10/13/19  2:37 AM  Result Value Ref Range   Sodium 133 (L) 135 - 145 mmol/L   Potassium 5.0 3.5 - 5.1 mmol/L    Comment: SLIGHT HEMOLYSIS   Chloride 105 98 - 111 mmol/L   CO2 16 (L) 22 - 32 mmol/L   Glucose, Bld 306 (H) 70 - 99 mg/dL   BUN 62 (H) 6 - 20 mg/dL   Creatinine, Ser 5.06 (H) 0.61 - 1.24 mg/dL   Calcium 7.1 (L) 8.9 - 10.3 mg/dL   Total Protein 6.4 (L) 6.5 - 8.1 g/dL   Albumin 2.0 (L) 3.5 - 5.0 g/dL   AST 61 (H) 15 - 41 U/L   ALT 115 (H) 0 - 44 U/L   Alkaline Phosphatase 138 (H) 38 - 126 U/L   Total Bilirubin 0.6 0.3 - 1.2 mg/dL   GFR calc non Af Amer 12 (L) >60 mL/min   GFR calc Af Amer 14 (L) >60 mL/min   Anion gap 12 5 - 15    Comment: Performed at [facility], [place] [address], [place], [place] [address]  CBC     Status: Abnormal   Collection Time: 10/13/19  2:37 AM  Result Value Ref Range   WBC 8.7 4.0 - 10.5 K/uL   RBC 3.73 (L) 4.22 - 5.81 MIL/uL   Hemoglobin 12.2 (L) 13.0 - 17.0 g/dL   HCT 36.1 (L) 39.0 - 52.0 %   MCV 96.8 80.0 - 100.0 fL   MCH 32.7 26.0 - 34.0 pg   MCHC 33.8 30.0 - 36.0 g/dL   RDW 14.9 11.5 - 15.5 %   Platelets 340 150 - 400 K/uL   nRBC 0.0 0.0 - 0.2 %    Comment: Performed at [facility], [place] [address], [place], [place] [address]  Ethanol     Status: None   Collection Time: 10/13/19  2:37 AM  Result Value Ref Range   Alcohol, Ethyl (B) <10 <10 mg/dL    Comment: (NOTE) Lowest detectable limit for serum alcohol is 10 mg/dL. For medical purposes only. Performed at [facility], [place] [address] [place],  [address]   Protime-INR  Status: None   Collection Time: 10/13/19  2:37 AM  Result Value Ref Range   Prothrombin Time 12.9 11.4 - 15.2 seconds   INR 1.0 0.8 - 1.2    Comment: (NOTE) INR goal varies based on device and disease states. Performed at [facility], [place] [address], [place], [place] [address]   CK     Status: Abnormal   Collection Time: 10/13/19  2:37 AM  Result Value Ref Range   Total CK 649 (H) 49 - 397 U/L    Comment: Performed at [facility], [place] [address], [place], [place] [address]  Lactic acid, plasma     Status: None   Collection Time: 10/13/19  2:38 AM  Result Value Ref Range   Lactic Acid, Venous 1.1 0.5 - 1.9 mmol/L    Comment: Performed at [facility] [address], [place], [place] [address]  SARS CORONAVIRUS 2 (TAT 6-24 HRS) Nasopharyngeal Nasopharyngeal Swab     Status: None   Collection Time: 10/13/19  2:38 AM   Specimen: Nasopharyngeal Swab  Result Value Ref Range   SARS Coronavirus 2 NEGATIVE NEGATIVE    Comment: (NOTE) SARS-CoV-2 target nucleic acids are NOT DETECTED. The SARS-CoV-2 RNA is generally detectable in  upper and lower respiratory specimens during the acute phase of infection. Negative results do not preclude SARS-CoV-2 infection, do not rule out co-infections with other pathogens, and should not be used as the sole basis for treatment or other patient management decisions. Negative results must be combined with clinical observations, patient history, and epidemiological information. The expected result is Negative. Fact Sheet for Patients: SugarRoll.be Fact Sheet for Healthcare Providers: https://www.woods-mathews.com/ This test is not yet approved or cleared by the [place] FDA and  has been authorized for detection and/or diagnosis of SARS-CoV-2 by FDA under an Emergency Use Authorization (EUA). This EUA will remain  in effect (meaning this test can be used) for the duration of the COVID-19 declaration under Section 56 4(b)(1) of the Act, 21 U.S.C. section 360bbb-3(b)(1), unless the authorization is terminated or revoked sooner. Performed at [facility], [place] [address], [place], [place] [address]   CBG monitoring, ED     Status: Abnormal   Collection Time: 10/13/19  5:39 AM  Result Value Ref Range   Glucose-Capillary 259 (H) 70 - 99 mg/dL  Urinalysis, Routine w reflex microscopic     Status: Abnormal   Collection Time: 10/13/19  6:38 AM  Result Value Ref Range   Color, Urine YELLOW YELLOW   APPearance CLEAR CLEAR   Specific Gravity, Urine 1.013 1.005 - 1.030   pH 6.0 5.0 - 8.0   Glucose, UA >=500 (A) NEGATIVE mg/dL   Hgb urine dipstick NEGATIVE NEGATIVE   Bilirubin Urine NEGATIVE NEGATIVE   Ketones, ur 5 (A) NEGATIVE mg/dL   Protein, ur >=300 (A) NEGATIVE mg/dL   Nitrite NEGATIVE NEGATIVE   Leukocytes,Ua NEGATIVE NEGATIVE   RBC / HPF 0-5 0 - 5 RBC/hpf   WBC, UA 0-5 0 - 5 WBC/hpf   Bacteria, UA NONE SEEN NONE SEEN    Comment: Performed at [facility], [place] [address], [place], [place] [address]  HIV Antibody  (routine testing w rflx)     Status: None   Collection Time: 10/13/19  9:22 AM  Result Value Ref Range   HIV Screen 4th Generation wRfx NON REACTIVE NON REACTIVE    Comment: Performed at [facility]  [address], [place], [place] [address]  Hemoglobin A1c     Status: Abnormal   Collection Time: 10/13/19  9:22 AM  Result Value Ref Range   Hgb A1c MFr Bld 8.5 (H) 4.8 - 5.6 %    Comment: (NOTE) Pre diabetes:          5.7%-6.4% Diabetes:              >6.4% Glycemic control for   <7.0% adults with diabetes    Mean Plasma Glucose 197.25 mg/dL    Comment: Performed at [facility] [address] [place], [place] [address]  Comprehensive metabolic panel     Status: Abnormal   Collection Time: 10/13/19  9:22 AM  Result Value Ref Range   Sodium 135 135 - 145 mmol/L   Potassium 4.8 3.5 - 5.1 mmol/L   Chloride 110 98 - 111 mmol/L   CO2 14 (L) 22 - 32 mmol/L   Glucose, Bld 242 (H) 70 - 99 mg/dL   BUN 59 (H) 6 - 20 mg/dL   Creatinine, Ser 4.80 (H) 0.61 - 1.24 mg/dL   Calcium 7.1 (L) 8.9 - 10.3 mg/dL   Total Protein 5.4 (L) 6.5 - 8.1 g/dL   Albumin 1.7 (L) 3.5 - 5.0 g/dL   AST 38 15 - 41 U/L   ALT 91 (H) 0 - 44 U/L   Alkaline Phosphatase 117 38 - 126 U/L   Total Bilirubin 0.5 0.3 - 1.2 mg/dL   GFR calc non Af Amer 13 (L) >60 mL/min   GFR calc Af Amer 15 (L) >60 mL/min   Anion gap 11 5 - 15    Comment: Performed at [facility], [place] [address] [place], [place] [address]  Glucose, capillary     Status: Abnormal   Collection Time: 10/13/19 10:57 AM  Result Value Ref Range   Glucose-Capillary 262 (H) 70 - 99 mg/dL  Comprehensive metabolic panel     Status: Abnormal   Collection Time: 10/13/19  4:13 PM  Result Value Ref Range   Sodium 132 (L) 135 - 145 mmol/L   Potassium 4.9 3.5 - 5.1 mmol/L   Chloride 105 98 - 111 mmol/L   CO2 16 (L) 22 - 32 mmol/L   Glucose, Bld 229 (H) 70 - 99 mg/dL   BUN 59 (H) 6 - 20 mg/dL   Creatinine, Ser 4.77 (H) 0.61 - 1.24 mg/dL    Calcium 7.1 (L) 8.9 - 10.3 mg/dL   Total Protein 5.6 (L) 6.5 - 8.1 g/dL   Albumin 1.7 (L) 3.5 - 5.0 g/dL   AST 36 15 - 41 U/L   ALT 88 (H) 0 - 44 U/L   Alkaline Phosphatase 118 38 - 126 U/L   Total Bilirubin 0.4 0.3 - 1.2 mg/dL   GFR calc non Af Amer 13 (L) >60 mL/min   GFR calc Af Amer 15 (L) >60 mL/min   Anion gap 11 5 - 15    Comment: Performed at [facility], [place] [address] [place], [place] [address]  CK     Status: None   Collection Time: 10/13/19  4:13 PM  Result Value Ref Range   Total CK 262 49 - 397 U/L    Comment: Performed at [facility], [place] [address], [place], [place] [address]  Glucose, capillary     Status: Abnormal   Collection Time: 10/13/19  4:43 PM  Result Value Ref Range   Glucose-Capillary 198 (H) 70 -  99 mg/dL  Glucose, capillary     Status: Abnormal   Collection Time: 10/13/19  9:54 PM  Result Value Ref Range   Glucose-Capillary 257 (H) 70 - 99 mg/dL  CBC     Status: Abnormal   Collection Time: 10/14/19  5:54 AM  Result Value Ref Range   WBC 8.0 4.0 - 10.5 K/uL   RBC 3.17 (L) 4.22 - 5.81 MIL/uL   Hemoglobin 10.3 (L) 13.0 - 17.0 g/dL   HCT 31.4 (L) 39.0 - 52.0 %   MCV 99.1 80.0 - 100.0 fL   MCH 32.5 26.0 - 34.0 pg   MCHC 32.8 30.0 - 36.0 g/dL   RDW 15.1 11.5 - 15.5 %   Platelets 296 150 - 400 K/uL   nRBC 0.0 0.0 - 0.2 %    Comment: Performed at [facility], [place] [address], [place], [place] [address]  Comprehensive metabolic panel     Status: Abnormal   Collection Time: 10/14/19  5:54 AM  Result Value Ref Range   Sodium 129 (L) 135 - 145 mmol/L   Potassium 4.5 3.5 - 5.1 mmol/L   Chloride 104 98 - 111 mmol/L   CO2 15 (L) 22 - 32 mmol/L   Glucose, Bld 349 (H) 70 - 99 mg/dL   BUN 53 (H) 6 - 20 mg/dL   Creatinine, Ser 4.46 (H) 0.61 - 1.24 mg/dL   Calcium 6.7 (L) 8.9 - 10.3 mg/dL   Total Protein 5.3 (L) 6.5 - 8.1 g/dL   Albumin 1.6 (L) 3.5 - 5.0 g/dL   AST 26 15 - 41 U/L   ALT 69 (H) 0 - 44 U/L   Alkaline Phosphatase 122 38 -  126 U/L   Total Bilirubin 0.4 0.3 - 1.2 mg/dL   GFR calc non Af Amer 14 (L) >60 mL/min   GFR calc Af Amer 16 (L) >60 mL/min   Anion gap 10 5 - 15    Comment: Performed at [facility], [place] [address], [place], [place] [address]  CK     Status: None   Collection Time: 10/14/19  5:54 AM  Result Value Ref Range   Total CK 177 49 - 397 U/L    Comment: Performed at [facility], [place] [address] [place], [place] [address]  Glucose, capillary     Status: Abnormal   Collection Time: 10/14/19  7:47 AM  Result Value Ref Range   Glucose-Capillary 301 (H) 70 - 99 mg/dL   Comment 1 Notify RN    Comment 2 Document in Chart     Radiology/Results: CT ABDOMEN PELVIS WO CONTRAST  Result Date: 10/13/2019 CLINICAL DATA:  Initial evaluation for acute trauma, fall. EXAM: CT CHEST, ABDOMEN AND PELVIS WITHOUT CONTRAST TECHNIQUE: Multidetector CT imaging of the chest, abdomen and pelvis was performed following the standard protocol without IV contrast. COMPARISON:  Comparison made with prior CT from 04/26/2019. FINDINGS: CT CHEST FINDINGS Cardiovascular: Intrathoracic aorta normal in caliber without aneurysm. Scattered atheromatous change noted about the arch and origin of the great vessels as well as within the descending intrathoracic aorta. Cardiomegaly. Scattered 3 vessel coronary artery calcifications. No pericardial effusion. Mediastinum/Nodes: Coarse calcification noted within the left lobe of thyroid, of doubtful significance. Visualized thyroid otherwise unremarkable. No pathologically enlarged mediastinal or hilar lymph nodes identified on this noncontrast examination. No axillary adenopathy. Esophagus within normal limits. No mediastinal hematoma. Lungs/Pleura: Tracheobronchial tree intact and patent. Lungs hypoinflated with elevation of the right hemidiaphragm. Moderate to  large right greater than left pleural effusions. These measure simple fluid density with no evidence for hemothorax.  Superimposed ground-glass opacity in interlobular septal thickening elsewhere within the aerated portions of the lungs, suggesting a degree of pulmonary interstitial edema. Superimposed scattered linear and parenchymal opacity within the dependent aspects of both lungs, likely atelectasis. Infection difficult to exclude. No pneumothorax. Musculoskeletal: Mild diffuse anasarca noted within the external soft tissues. There is an acute fracture of the left scapular wing (series 7, image 49). There is an acute fracture of the left anterolateral third rib, lateral left fifth and sixth ribs. Additional acute appearing fracture of the left posterolateral eleventh rib (series 7, image 164). On the right, there are acute nondisplaced fractures of the right lateral 6 and likely seventh ribs, as well as the posterior ninth and tenth ribs. Multiple underlying chronic bilateral rib fractures noted. There is new destructive process involving the superior aspect of the sternum (series 6, image 70). Finding suspected to reflect a previous/remote fracture with osseous remodeling. No significant surrounding soft tissue stranding identified. Age indeterminate fractures involving T3 and the right transverse processes of T1 and T2 noted, better evaluated on concomitant CT of the thoracic spine. CT ABDOMEN PELVIS FINDINGS Hepatobiliary: Limited noncontrast evaluation of the liver is grossly unremarkable. Multiple calcified stones noted within the gallbladder lumen. No evidence for acute cholecystitis. No biliary dilatation. Pancreas: Pancreas demonstrates no acute abnormality. Few scattered calcific densities at the pancreatic head and body favored to be vascular in nature. Spleen: Spleen grossly intact and without acute abnormality on this noncontrast examination. Dystrophic calcifications in contour deformity along the lateral margin again noted, stable. Adrenals/Urinary Tract: Adrenal glands within normal limits. Kidneys equal in  size. No nephrolithiasis. No hydronephrosis. Mild scattered perinephric stranding noted about the kidneys bilaterally, nonspecific, but similar to previous. No hydroureter. Bladder moderately distended without acute abnormality. Stomach/Bowel: Stomach within normal limits. No evidence for bowel obstruction. Negative appendix. Large volume retained stool seen within the colon and rectal vault, suggesting constipation. No acute inflammatory changes seen about the bowels. Vascular/Lymphatic: Vascular patency not evaluated given lack of IV contrast. Intra-abdominal aorta of normal caliber. Mild atherosclerotic change. No adenopathy. Reproductive: Prostate and seminal vesicles within normal limits. Other: No free air or fluid. No mesenteric or retroperitoneal hematoma. Musculoskeletal: No acute fracture within the pelvis. No discrete lytic or blastic osseous lesions. Diffuse anasarca seen within the external soft tissues. Asymmetric soft tissue stranding within the subcutaneous fat of the lateral left flank could reflect mild contusion (series 7, image 188). IMPRESSION: CT CHEST 1. Acute bilateral rib fractures as above. No evidence for hemothorax or pneumothorax. 2. Acute fracture of the left scapular wing. 3. Age indeterminate fractures involving the T3 as well as the T1 and T2 right transverse processes, better evaluated on concomitant CT of the thoracic spine. 4. Moderate to large right greater than left pleural effusions with de pendant opacity, likely atelectasis. Superimposed infection difficult to exclude, and could be considered in the correct clinical setting. Superimposed ground-glass opacity and interlobular septal thickening suggest a degree of pulmonary interstitial edema. 5. Destructive process involving the sternum, new from previous. Finding favored to reflect a remote fracture with associated osseous remodeling. Possible infection or even a destructive lytic mass not entirely excluded. Correlation with  physical exam recommended. Additionally, further assessment with dedicated MRI could be performed as warranted. CT ABDOMEN AND PELVIS 1. Asymmetric soft tissue stranding within the subcutaneous fat of the lateral left flank, which may reflect mild contusion.  2. No other acute traumatic injury within the abdomen and pelvis. 3. Large volume retained stool within the colon, suggesting constipation. 4. Cholelithiasis. 5. Diffuse anasarca, likely related overall volume status. Electronically Signed   By: Jeannine Boga M.D.   On: 10/13/2019 05:20   CT HEAD WO CONTRAST  Result Date: 10/13/2019 CLINICAL DATA:  Initial evaluation for acute trauma, fall. EXAM: CT HEAD WITHOUT CONTRAST CT CERVICAL SPINE WITHOUT CONTRAST TECHNIQUE: Multidetector CT imaging of the head and cervical spine was performed following the standard protocol without intravenous contrast. Multiplanar CT image reconstructions of the cervical spine were also generated. COMPARISON:  None available. FINDINGS: CT HEAD FINDINGS Brain: Generalized age-related cerebral atrophy with chronic microvascular ischemic disease. No acute intracranial hemorrhage. No acute large vessel territory infarct. No mass lesion, midline shift or mass effect. No hydrocephalus. No extra-axial fluid collection. Vascular: No hyperdense vessel. Scattered vascular calcifications noted within the carotid siphons. Skull: Scalp soft tissues demonstrate no acute finding. Calvarium intact. Sinuses/Orbits: Globes and orbital soft tissues within normal limits. Mild mucosal thickening noted within the ethmoidal air cells and maxillary sinuses. Few scattered dental caries noted. No mastoid effusion. Other: None. CT CERVICAL SPINE FINDINGS Alignment: Vertebral bodies normally aligned with preservation of the normal cervical lordosis. No listhesis. Skull base and vertebrae: Skull base intact. Normal C1-2 articulations are preserved in the dens is intact. There is minimal height loss at  the superior endplate of C7, age indeterminate (series 6, image 78). Vertebral body height otherwise maintained. Additional age-indeterminate fractures of the right transverse processes of T1 and T2. Remote fracture of the T1 spinous process noted. No other acute fracture identified. Soft tissues and spinal canal: Paraspinous soft tissues demonstrate no acute finding. No significant prevertebral edema. Vascular calcifications about the carotid bifurcations. Disc levels: Mild multilevel degenerative disc bulging, most notable at C3-4. Left-sided facet arthrosis noted at C2-3. Upper chest: Bilateral pleural effusions noted within the visualized lung apices, better evaluated on concomitant CT of the chest. Other: None. IMPRESSION: CT BRAIN: 1. No acute intracranial abnormality. 2. Generalized age-related cerebral atrophy with mild chronic small vessel ischemic disease. CT CERVICAL SPINE: 1. Mild height loss at the superior endplate of C7, age indeterminate. Correlation with physical exam for possible pain at this location recommended. Additionally, finding could be further assessed with dedicated MRI as clinically warranted. 2. Nondisplaced fractures of the right transverse processes of T1 and T2, also somewhat age indeterminate. 3. Remote fracture of the T1 spinous process, stable from previous. Electronically Signed   By: Jeannine Boga M.D.   On: 10/13/2019 04:33   CT Chest Wo Contrast  Result Date: 10/13/2019 CLINICAL DATA:  Initial evaluation for acute trauma, fall. EXAM: CT CHEST, ABDOMEN AND PELVIS WITHOUT CONTRAST TECHNIQUE: Multidetector CT imaging of the chest, abdomen and pelvis was performed following the standard protocol without IV contrast. COMPARISON:  Comparison made with prior CT from 04/26/2019. FINDINGS: CT CHEST FINDINGS Cardiovascular: Intrathoracic aorta normal in caliber without aneurysm. Scattered atheromatous change noted about the arch and origin of the great vessels as well as  within the descending intrathoracic aorta. Cardiomegaly. Scattered 3 vessel coronary artery calcifications. No pericardial effusion. Mediastinum/Nodes: Coarse calcification noted within the left lobe of thyroid, of doubtful significance. Visualized thyroid otherwise unremarkable. No pathologically enlarged mediastinal or hilar lymph nodes identified on this noncontrast examination. No axillary adenopathy. Esophagus within normal limits. No mediastinal hematoma. Lungs/Pleura: Tracheobronchial tree intact and patent. Lungs hypoinflated with elevation of the right hemidiaphragm. Moderate to large right greater than  left pleural effusions. These measure simple fluid density with no evidence for hemothorax. Superimposed ground-glass opacity in interlobular septal thickening elsewhere within the aerated portions of the lungs, suggesting a degree of pulmonary interstitial edema. Superimposed scattered linear and parenchymal opacity within the dependent aspects of both lungs, likely atelectasis. Infection difficult to exclude. No pneumothorax. Musculoskeletal: Mild diffuse anasarca noted within the external soft tissues. There is an acute fracture of the left scapular wing (series 7, image 49). There is an acute fracture of the left anterolateral third rib, lateral left fifth and sixth ribs. Additional acute appearing fracture of the left posterolateral eleventh rib (series 7, image 164). On the right, there are acute nondisplaced fractures of the right lateral 6 and likely seventh ribs, as well as the posterior ninth and tenth ribs. Multiple underlying chronic bilateral rib fractures noted. There is new destructive process involving the superior aspect of the sternum (series 6, image 70). Finding suspected to reflect a previous/remote fracture with osseous remodeling. No significant surrounding soft tissue stranding identified. Age indeterminate fractures involving T3 and the right transverse processes of T1 and T2 noted,  better evaluated on concomitant CT of the thoracic spine. CT ABDOMEN PELVIS FINDINGS Hepatobiliary: Limited noncontrast evaluation of the liver is grossly unremarkable. Multiple calcified stones noted within the gallbladder lumen. No evidence for acute cholecystitis. No biliary dilatation. Pancreas: Pancreas demonstrates no acute abnormality. Few scattered calcific densities at the pancreatic head and body favored to be vascular in nature. Spleen: Spleen grossly intact and without acute abnormality on this noncontrast examination. Dystrophic calcifications in contour deformity along the lateral margin again noted, stable. Adrenals/Urinary Tract: Adrenal glands within normal limits. Kidneys equal in size. No nephrolithiasis. No hydronephrosis. Mild scattered perinephric stranding noted about the kidneys bilaterally, nonspecific, but similar to previous. No hydroureter. Bladder moderately distended without acute abnormality. Stomach/Bowel: Stomach within normal limits. No evidence for bowel obstruction. Negative appendix. Large volume retained stool seen within the colon and rectal vault, suggesting constipation. No acute inflammatory changes seen about the bowels. Vascular/Lymphatic: Vascular patency not evaluated given lack of IV contrast. Intra-abdominal aorta of normal caliber. Mild atherosclerotic change. No adenopathy. Reproductive: Prostate and seminal vesicles within normal limits. Other: No free air or fluid. No mesenteric or retroperitoneal hematoma. Musculoskeletal: No acute fracture within the pelvis. No discrete lytic or blastic osseous lesions. Diffuse anasarca seen within the external soft tissues. Asymmetric soft tissue stranding within the subcutaneous fat of the lateral left flank could reflect mild contusion (series 7, image 188). IMPRESSION: CT CHEST 1. Acute bilateral rib fractures as above. No evidence for hemothorax or pneumothorax. 2. Acute fracture of the left scapular wing. 3. Age  indeterminate fractures involving the T3 as well as the T1 and T2 right transverse processes, better evaluated on concomitant CT of the thoracic spine. 4. Moderate to large right greater than left pleural effusions with de pendant opacity, likely atelectasis. Superimposed infection difficult to exclude, and could be considered in the correct clinical setting. Superimposed ground-glass opacity and interlobular septal thickening suggest a degree of pulmonary interstitial edema. 5. Destructive process involving the sternum, new from previous. Finding favored to reflect a remote fracture with associated osseous remodeling. Possible infection or even a destructive lytic mass not entirely excluded. Correlation with physical exam recommended. Additionally, further assessment with dedicated MRI could be performed as warranted. CT ABDOMEN AND PELVIS 1. Asymmetric soft tissue stranding within the subcutaneous fat of the lateral left flank, which may reflect mild contusion. 2. No other acute traumatic  injury within the abdomen and pelvis. 3. Large volume retained stool within the colon, suggesting constipation. 4. Cholelithiasis. 5. Diffuse anasarca, likely related overall volume status. Electronically Signed   By: Jeannine Boga M.D.   On: 10/13/2019 05:20   CT CERVICAL SPINE WO CONTRAST  Result Date: 10/13/2019 CLINICAL DATA:  Initial evaluation for acute trauma, fall. EXAM: CT HEAD WITHOUT CONTRAST CT CERVICAL SPINE WITHOUT CONTRAST TECHNIQUE: Multidetector CT imaging of the head and cervical spine was performed following the standard protocol without intravenous contrast. Multiplanar CT image reconstructions of the cervical spine were also generated. COMPARISON:  None available. FINDINGS: CT HEAD FINDINGS Brain: Generalized age-related cerebral atrophy with chronic microvascular ischemic disease. No acute intracranial hemorrhage. No acute large vessel territory infarct. No mass lesion, midline shift or mass  effect. No hydrocephalus. No extra-axial fluid collection. Vascular: No hyperdense vessel. Scattered vascular calcifications noted within the carotid siphons. Skull: Scalp soft tissues demonstrate no acute finding. Calvarium intact. Sinuses/Orbits: Globes and orbital soft tissues within normal limits. Mild mucosal thickening noted within the ethmoidal air cells and maxillary sinuses. Few scattered dental caries noted. No mastoid effusion. Other: None. CT CERVICAL SPINE FINDINGS Alignment: Vertebral bodies normally aligned with preservation of the normal cervical lordosis. No listhesis. Skull base and vertebrae: Skull base intact. Normal C1-2 articulations are preserved in the dens is intact. There is minimal height loss at the superior endplate of C7, age indeterminate (series 6, image 56). Vertebral body height otherwise maintained. Additional age-indeterminate fractures of the right transverse processes of T1 and T2. Remote fracture of the T1 spinous process noted. No other acute fracture identified. Soft tissues and spinal canal: Paraspinous soft tissues demonstrate no acute finding. No significant prevertebral edema. Vascular calcifications about the carotid bifurcations. Disc levels: Mild multilevel degenerative disc bulging, most notable at C3-4. Left-sided facet arthrosis noted at C2-3. Upper chest: Bilateral pleural effusions noted within the visualized lung apices, better evaluated on concomitant CT of the chest. Other: None. IMPRESSION: CT BRAIN: 1. No acute intracranial abnormality. 2. Generalized age-related cerebral atrophy with mild chronic small vessel ischemic disease. CT CERVICAL SPINE: 1. Mild height loss at the superior endplate of C7, age indeterminate. Correlation with physical exam for possible pain at this location recommended. Additionally, finding could be further assessed with dedicated MRI as clinically warranted. 2. Nondisplaced fractures of the right transverse processes of T1 and T2,  also somewhat age indeterminate. 3. Remote fracture of the T1 spinous process, stable from previous. Electronically Signed   By: Jeannine Boga M.D.   On: 10/13/2019 04:33   CT T-SPINE NO CHARGE  Result Date: 10/13/2019 CLINICAL DATA:  Initial evaluation for acute trauma, fall. Back pain. EXAM: CT THORACIC SPINE WITHOUT CONTRAST TECHNIQUE: Multidetector CT images of the thoracic were obtained using the standard protocol without intravenous contrast. COMPARISON:  Prior CT from 04/26/2019. FINDINGS: Alignment: Vertebral bodies normally aligned with preservation of the normal thoracic kyphosis. No listhesis. Vertebrae: Chronic height loss at the superior endplates of T2 and T4 is stable from previous exam. Remote fracture of the T1 spinous process also unchanged. Mild approximate 20% height loss at the superior endplate of T3, new from previous, and could be acute to subacute in nature. No bony retropulsion. Additionally, there are nondisplaced fractures of the right transverse processes of T1, T2, and T3, also new from previous, and could be acute to subacute in nature. Otherwise, vertebral body height maintained with no other acute fracture identified. No discrete osseous lesions. Multiple remotely healed rib fractures  noted, stable. Paraspinal and other soft tissues: Paraspinous soft tissues demonstrate no acute finding. Large right greater than left pleural effusions with associated atelectasis and/or consolidation, better evaluated on concomitant chest CT. Disc levels: Mild scattered degenerative endplate spurring noted within the mid and lower thoracic spine. No appreciable spinal stenosis. IMPRESSION: 1. Mild approximate 20% height loss at the superior endplate of T3, somewhat age indeterminate, but new from previous, and could be acute to subacute in nature. No bony retropulsion. Correlation with physical exam recommended. 2. Nondisplaced fractures of the right transverse processes of T1, T2, and T3,  also new from previous, and could be acute to subacute in nature. 3. Chronic compression deformities at the superior endplates of T2 and T4, stable. 4. Large right greater than left pleural effusions with associated atelectasis and/or consolidation, better evaluated on concomitant chest CT. Electronically Signed   By: Jeannine Boga M.D.   On: 10/13/2019 03:54   CT L-SPINE NO CHARGE  Result Date: 10/13/2019 CLINICAL DATA:  Initial evaluation for acute trauma, fall. EXAM: CT LUMBAR SPINE WITHOUT CONTRAST TECHNIQUE: Multidetector CT imaging of the lumbar spine was performed without intravenous contrast administration. Multiplanar CT image reconstructions were also generated. COMPARISON:  Prior CT from 04/26/2019. FINDINGS: Segmentation: Standard. Lowest well-formed disc space labeled the L5-S1 level. Alignment: Physiologic with preservation of the normal lumbar lordosis. No subluxation or malalignment. Vertebrae: Vertebral body height maintained without evidence for acute or chronic fracture. Visualized sacrum and pelvis intact. SI joints approximated symmetric. No discrete osseous lesions. Paraspinal and other soft tissues: Paraspinous soft tissues within normal limits. Disc levels: L1-2:  Unremarkable. L2-3:  Negative interspace. Mild facet hypertrophy. No stenosis. L3-4: Chronic intervertebral disc space narrowing with diffuse disc bulge. Mild facet hypertrophy. Resultant mild-to-moderate spinal stenosis. Moderate right with mild left foraminal narrowing. L4-5: Mild disc bulge. Bilateral facet hypertrophy. No significant stenosis. L5-S1:  Minimal disc bulge. No stenosis. IMPRESSION: 1. No acute traumatic injury within the lumbar spine. 2. Degenerative disc bulge with facet hypertrophy at L3-4 with resultant mild to moderate spinal stenosis. Electronically Signed   By: Jeannine Boga M.D.   On: 10/13/2019 04:05   DG Chest Port 1 View  Result Date: 10/13/2019 CLINICAL DATA:  Fall EXAM: PORTABLE  CHEST 1 VIEW COMPARISON:  06/11/2019 FINDINGS: Cardiomegaly. Probable scarring in the left lung, similar to prior study. Right lung clear. No effusions. Multiple old left rib fractures. There appear to be acute lateral left rib fractures of the 5th through 7th ribs. No pneumothorax. IMPRESSION: Multiple old left rib fractures. There appear to be acute superimposed fractures in the lateral left 5th through 7th ribs. Chronic opacities in the left lung, likely scarring. Mild cardiomegaly. Electronically Signed   By: Rolm Baptise M.D.   On: 10/13/2019 02:34   DG Cerv Spine Flex&Ext Only  Result Date: 10/13/2019 CLINICAL DATA:  Fall.  Back pain. EXAM: CERVICAL SPINE - FLEXION AND EXTENSION VIEWS ONLY COMPARISON:  CT 10/13/2019. FINDINGS: C6 and C7 not completely visualized. Mild prevertebral soft tissue swelling cannot be excluded. Diffuse osteopenia and degenerative change present. No evidence of flexion or extension instability. Reference is made to CT for discussion of cervical and thoracic fractures present. IMPRESSION: 1.  Mild prevertebral soft tissue swelling cannot be excluded. 2. C6 and C7 not completely visualized. No flexion or extension instability. Reference is made to CT for discussion of cervical and thoracic fractures present. 3.  Diffuse osteopenia and degenerative change. Electronically Signed   By: Marcello Moores  Register   On:  10/13/2019 07:54    Anti-infectives: Anti-infectives (From admission, onward)   None      Assessment/Plan: Problem List: Patient Active Problem List   Diagnosis Date Noted  . Rhabdomyolysis 10/13/2019  . Fall at home, initial encounter 10/13/2019  . Hypertension   . Dyslipidemia   . Diabetes mellitus without complication ([place])   . Dementia ([place])   . COPD (chronic obstructive pulmonary disease) ([place])   . CKD stage 4 secondary to hypertension ([place])   . Chronic diastolic (congestive) heart failure (H[facility])     DM with bladder full of urine.  Needs Foley.  May have  acute respiratory acidosis on top of chronic metabolic acidosis.  ABG pending. * No surgery found *    LOS: 1 day   Matt B. Hassell Done, MD, [facility] [phone] beeper [phone]  10/14/2019 8:22 AM

## 2019-10-14 NOTE — Progress Notes (Signed)
Patient is back in room from Ultrasound. Spoke with Barry Lewis from ultrasound. Pt refused to continue with ultrasound.   Pt is back in room and is asking for food and agitated.

## 2019-10-14 NOTE — Progress Notes (Signed)
Notified MD on call about pt's BP. Awaiting for orders.

## 2019-10-14 NOTE — Progress Notes (Signed)
Pt states has blurred vision but has had this at home and does not have a meter for checking his CBG at home.
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
## 2019-10-14 NOTE — Progress Notes (Signed)
Pt states he wants food, Dr. Tyrell Antonio asked but he needs to remain NPO for now.  Report given to [facility] on [facility] and will transport pt via bed.

## 2019-10-14 NOTE — Progress Notes (Signed)
Nurse spoke with pt's daughter Clementeen Graham regarding plan of care. Questions and concerns answered.

## 2019-10-14 NOTE — Evaluation (Signed)
Occupational Therapy Evaluation Patient Details Name: Barry Lewis MRN: [id number] DOB: October 29, 1960 Today's Date: 10/14/2019    History of Present Illness Pt is a 59 y/o male admitted secondary to sustaining a fall and being found down for several hours. He was found to have multiple injuries, including acute rib fractures, scapular fracture, T3 compression fracture and transverse process fractures at T1-T2.  He has been evaluated by neurosurgery, and orthopedic surgery and it appears that conservative treatment has been recommended for these fractures. PMH including but not limited to CVA; schizoaffective/bipolar disorder; polysubstance abuse; HLD; DM; dementia; COPD; chronic diastolic CHF.   Clinical Impression   Pt PTA: pt living alone and reports independence prior. Pt apparently passed out and unable to recall events prior to fall. Pt currently limited by decreased awareness of severity of situation, decreased initiation of tasks,  poor activity tolerance and decreased ability to care for self. Pt appeared defensive when PT asking about home set-up questions and replying with 1 work answers, sighs and stating "I need to eat something and where is my tylenol?" Pt totalA +2 for bed mobility as pt resistive and not willing to assist. Pt maxA for ADL at this time. Pt using BUEs for purposeful movements, but unable to formally test. Pt would benefit from continued OT skilled services for ADL, mobility and safety. Until OT sees more initiation, then pt remains SNF appropriate. OT following acutely.    Follow Up Recommendations  SNF    Equipment Recommendations  Other (comment)(to be determined)    Recommendations for Other Services       Precautions / Restrictions Precautions Precautions: Fall Restrictions Weight Bearing Restrictions: Yes LUE Weight Bearing: Weight bearing as tolerated      Mobility Bed Mobility Overal bed mobility: Needs Assistance             General bed mobility  comments: total A x2 required for repositioning in bed with use of bed pads. Pt initially attempting to sit EOB with initiation of LE movement towards side of bed; however, pt perseverating on wanting to eat food despite MD's orders for NPO. Pt very agitated and upset, yelling at the therapist "don't touch me!" when attempting to assist with sitting upright  Transfers                 General transfer comment: deferred as pt unwilling to cooperate to OOB activity    Balance                                           ADL either performed or assessed with clinical judgement   ADL Overall ADL's : Needs assistance/impaired Eating/Feeding: Maximal assistance   Grooming: Maximal assistance   Upper Body Bathing: Maximal assistance   Lower Body Bathing: Maximal assistance   Upper Body Dressing : Maximal assistance   Lower Body Dressing: Maximal assistance   Toilet Transfer: Maximal assistance;Total assistance   Toileting- Clothing Manipulation and Hygiene: Maximal assistance;Total assistance       Functional mobility during ADLs: Maximal assistance;Total assistance(Pt resisted movements) General ADL Comments: Pt limited by decreased awareness of severity of situation, short temper,  poor activity tolerance and decreased ability to care for self.     Vision Baseline Vision/History: No visual deficits Vision Assessment?: Vision impaired- to be further tested in functional context Additional Comments: Unable to test     Perception  Praxis      Pertinent Vitals/Pain Pain Assessment: Faces Faces Pain Scale: Hurts even more Pain Location: ribs Pain Descriptors / Indicators: Grimacing;Guarding Pain Intervention(s): Monitored during session;Patient requesting pain meds-RN notified     Hand Dominance Right   Extremity/Trunk Assessment Upper Extremity Assessment Upper Extremity Assessment: Difficult to assess due to impaired cognition   Lower  Extremity Assessment Lower Extremity Assessment: Difficult to assess due to impaired cognition       Communication Communication Communication: No difficulties   Cognition Arousal/Alertness: Awake/alert Behavior During Therapy: Agitated;Flat affect Overall Cognitive Status: Impaired/Different from baseline Area of Impairment: Memory;Following commands;Safety/judgement;Problem solving                     Memory: Decreased short-term memory;Decreased recall of precautions Following Commands: Follows one step commands inconsistently Safety/Judgement: Decreased awareness of deficits;Decreased awareness of safety   Problem Solving: Slow processing;Decreased initiation;Difficulty sequencing;Requires verbal cues General Comments: Pt appeared defensive when PT asking about home set-up questions and replying with 1 work answers, sighs and stating "I need to eat something and where is my tylenol?"   General Comments  RN called for tylenol    Exercises     Shoulder Instructions      Home Living Family/patient expects to be discharged to:: Private residence Living Arrangements: Alone     Home Access: Stairs to enter Entrance Stairs-Number of Steps: 1 Entrance Stairs-Rails: None Home Layout: One level               Home Equipment: [place] - single point          Prior Functioning/Environment Level of Independence: Independent        Comments: uses a cane to ambulate        OT Problem List: Decreased activity tolerance;Decreased strength;Impaired balance (sitting and/or standing);Decreased safety awareness;Decreased cognition;Pain;Increased edema      OT Treatment/Interventions: Self-care/ADL training;Therapeutic exercise;Energy conservation;Therapeutic activities;Visual/perceptual remediation/compensation;Cognitive remediation/compensation;Patient/family education;Balance training    OT Goals(Current goals can be found in the care plan section) Acute Rehab OT  Goals Patient Stated Goal: to eat OT Goal Formulation: With patient Time For Goal Achievement: 10/28/19 Potential to Achieve Goals: Good ADL Goals Pt Will Perform Grooming: (P) with set-up;standing Pt Will Perform Lower Body Dressing: (P) with supervision;sit to/from stand Pt Will Transfer to Toilet: (P) with min guard assist;ambulating;regular height toilet Pt Will Perform Toileting - Clothing Manipulation and hygiene: (P) with min guard assist;sitting/lateral leans;sit to/from stand Pt/caregiver will Perform Home Exercise Program: (P) Increased strength;Both right and left upper extremity;With Supervision  OT Frequency: Min 2X/week   Barriers to D/C: Decreased caregiver support  pt is home alone       Co-evaluation PT/OT/SLP Co-Evaluation/Treatment: Yes Reason for Co-Treatment: Necessary to address cognition/behavior during functional activity PT goals addressed during session: Mobility/safety with mobility OT goals addressed during session: Strengthening/ROM      AM-PAC OT "6 Clicks" Daily Activity     Outcome Measure Help from another person eating meals?: A Lot Help from another person taking care of personal grooming?: A Lot Help from another person toileting, which includes using toliet, bedpan, or urinal?: A Lot Help from another person bathing (including washing, rinsing, drying)?: A Lot Help from another person to put on and taking off regular upper body clothing?: A Lot Help from another person to put on and taking off regular lower body clothing?: A Lot 6 Click Score: 12   End of Session Nurse Communication: Mobility status;Patient requests pain meds  Activity  Tolerance: Treatment limited secondary to medical complications (Comment);Treatment limited secondary to agitation Patient left: in bed;with call bell/phone within reach;with bed alarm set;with nursing/sitter in room  OT Visit Diagnosis: Muscle weakness (generalized) (M62.81);Other symptoms and signs  involving cognitive function                Time: 1131-1155 OT Time Calculation (min): 24 min Charges:  OT General Charges $OT Visit: 1 Visit OT Evaluation $OT Eval Low Complexity: [facility] OTR/L Acute Rehabilitation Services Pager: [phone] Office: [phone]   Travonta Gill C 10/14/2019, 3:55 PM

## 2019-10-14 NOTE — Progress Notes (Signed)
Pt in a junctional rhythm per Central Monitoring.

## 2019-10-14 NOTE — Progress Notes (Signed)
Nurse called speech therapy [phone]) number and left number to call back.

## 2019-10-14 NOTE — Progress Notes (Signed)
Pt has two Right plantar ulceration. One has serous/pus drainage and odor.  Wound care given and covered with gauze.    Two wounds in left knee and one in right knee covered with foam dressings.  2 cm cut on right heel non healing. Covered with foam.   MD aware.  Wound consult orders placed.

## 2019-10-14 NOTE — Evaluation (Signed)
Physical Therapy Evaluation Patient Details Name: Barry Lewis MRN: [id number] DOB: [birth year] Today's Date: 10/14/2019   History of Present Illness  Pt is a 59 y/o male admitted secondary to sustaining a fall and being found down for several hours. He was found to have multiple injuries, including acute rib fractures, scapular fracture, T3 compression fracture and transverse process fractures at T1-T2.  He has been evaluated by neurosurgery, and orthopedic surgery and it appears that conservative treatment has been recommended for these fractures. PMH including but not limited to CVA; schizoaffective/bipolar disorder; polysubstance abuse; HLD; DM; dementia; COPD; chronic diastolic CHF.    Clinical Impression  Pt presented supine in bed with HOB elevated, awake and confused throughout, perseverating on wanting to eat. Prior to admission, pt reported that he ambulated with use of a cane and was independent with ADLs. Pt lives alone and did not report anyone that could assist him upon d/c. Pt very limited during evaluation secondary to cognitive deficits and agitation. He required total A of two for bed mobility and repositioning in bed. Pt self-limiting and resistant to movement throughout; demanding food despite MD orders to be NPO and therapist explaining that several times to him. At this time, recommend pt d/c to SNF for further therapy services prior to returning home alone. Pt would continue to benefit from skilled physical therapy services at this time while admitted and after d/c to address the below listed limitations in order to improve overall safety and independence with functional mobility.  Pt on RA throughout with SPO2 maintaining at 95-97%.    Follow Up Recommendations SNF    Equipment Recommendations  None recommended by PT    Recommendations for Other Services       Precautions / Restrictions Precautions Precautions: Fall Restrictions Weight Bearing Restrictions: Yes LUE  Weight Bearing: Weight bearing as tolerated      Mobility  Bed Mobility Overal bed mobility: Needs Assistance             General bed mobility comments: total A x2 required for repositioning in bed with use of bed pads. Pt initially attempting to sit EOB with initiation of LE movement towards side of bed; however, pt perseverating on wanting to eat food despite MD's orders for NPO. Pt very agitated and upset, yelling at the therapist "don't touch me!" when attempting to assist with sitting upright  Transfers                    Ambulation/Gait                Stairs            Wheelchair Mobility    Modified Rankin (Stroke Patients Only)       Balance                                             Pertinent Vitals/Pain Pain Assessment: Faces Faces Pain Scale: Hurts even more Pain Location: ribs Pain Descriptors / Indicators: Grimacing;Guarding Pain Intervention(s): Monitored during session;Repositioned;Patient requesting pain meds-RN notified    Home Living Family/patient expects to be discharged to:: Private residence Living Arrangements: Alone     Home Access: Stairs to enter Entrance Stairs-Rails: None Entrance Stairs-Number of Steps: 1 Home Layout: One level Home Equipment: Cane - single point      Prior Function Level of Independence: Independent  Comments: uses a cane to ambulate     Hand Dominance   Dominant Hand: Right    Extremity/Trunk Assessment   Upper Extremity Assessment Upper Extremity Assessment: Difficult to assess due to impaired cognition    Lower Extremity Assessment Lower Extremity Assessment: Difficult to assess due to impaired cognition       Communication   Communication: No difficulties  Cognition Arousal/Alertness: Awake/alert Behavior During Therapy: Agitated;Flat affect Overall Cognitive Status: Impaired/Different from baseline Area of Impairment: Memory;Following  commands;Safety/judgement;Problem solving                     Memory: Decreased short-term memory;Decreased recall of precautions Following Commands: Follows one step commands inconsistently Safety/Judgement: Decreased awareness of deficits;Decreased awareness of safety   Problem Solving: Slow processing;Decreased initiation;Difficulty sequencing;Requires verbal cues        General Comments      Exercises     Assessment/Plan    PT Assessment Patient needs continued PT services  PT Problem List Decreased strength;Decreased range of motion;Decreased activity tolerance;Decreased balance;Decreased mobility;Decreased cognition;Decreased coordination;Decreased knowledge of use of DME;Decreased safety awareness;Decreased knowledge of precautions;Pain       PT Treatment Interventions DME instruction;Gait training;Stair training;Functional mobility training;Therapeutic activities;Therapeutic exercise;Balance training;Neuromuscular re-education;Cognitive remediation;Patient/family education    PT Goals (Current goals can be found in the Care Plan section)  Acute Rehab PT Goals Patient Stated Goal: to eat PT Goal Formulation: With patient Time For Goal Achievement: 10/28/19 Potential to Achieve Goals: Fair    Frequency Min 2X/week   Barriers to discharge        Co-evaluation PT/OT/SLP Co-Evaluation/Treatment: Yes Reason for Co-Treatment: Necessary to address cognition/behavior during functional activity;For patient/therapist safety;To address functional/ADL transfers PT goals addressed during session: Mobility/safety with mobility         AM-PAC PT "6 Clicks" Mobility  Outcome Measure Help needed turning from your back to your side while in a flat bed without using bedrails?: Total Help needed moving from lying on your back to sitting on the side of a flat bed without using bedrails?: Total Help needed moving to and from a bed to a chair (including a wheelchair)?:  Total Help needed standing up from a chair using your arms (e.g., wheelchair or bedside chair)?: Total Help needed to walk in hospital room?: Total Help needed climbing 3-5 steps with a railing? : Total 6 Click Score: 6    End of Session   Activity Tolerance: Treatment limited secondary to agitation Patient left: in bed;with call bell/phone within reach;with bed alarm set Nurse Communication: Mobility status;Patient requests pain meds PT Visit Diagnosis: Other abnormalities of gait and mobility (R26.89)    Time: [id number] PT Time Calculation (min) (ACUTE ONLY): 17 min   Charges:   PT Evaluation $PT Eval Low Complexity: 1 Low          Eduard Clos, PT, DPT  Acute Rehabilitation Services Pager [phone] Office [place] 10/14/2019, 12:48 PM

## 2019-10-14 NOTE — Consult Note (Signed)
WOC Nurse Consult Note: Reason for Consult:Bilateral knees with full thickness injuries from trauma, right heel fissure, right foot (plantar aspect) neuropathic ulcers the larger of which is over the first metatarsal head (full thickness) and a smaller lesions over the 4th metatarsal head. The first met head ulcer is concerning for depth, odor and drainage and may warrant surgical (orthopedic) consultation. Wound type:trauma, neuropathic Pressure Injury POA: N/A Measurement: Bedside RN is a Wound Treatment Associate (WTA) RN V. Moreno) and she will complete wound measurements and place on the Nursing Flow Sheet today. Her assistance and expertise is appreciated. Wound bed: Right foot, first met head full thickness ulcer: See also photo provided from ED MD yesterday.  Blackened discoloration in the periwound tissues, closed wound edges, full depth is unable to be determined due to the presence of necrotic tissue. Smaller lesion over the 4th met head is dry and deeply discolored.  Bilateral knee lesions are full thickness, moist, yellow wound bed. No odor Right foot with linear fissure (2cm) Drainage (amount, consistency, odor) Only significant from right foot, 1st metatarsal head ulceration.  Periwound: dry Dressing procedure/placement/frequency: Communicated recommendation for Orthopedics to evaluation right foot, 1st met head ulcer for consideration of debridement and further evaluation to Dr. Tyrell Antonio.  Conservative care orders are placed for the foot lesions as well as the other lesions. Bilateral knee wounds will be treated daily with xeroform (nonadherent, antimicrobial) dressings and covered with foam and the right foot heel fissure with moisturizing (Eucerin, twice daily). A prophylactic foam dressing is placed to the patient's sacrum. The right foot plantar ulcer and lesion will be treated with saline moistened gauze dressing twice daily.  [facility] nursing team will not follow, but will remain  available to this patient, the nursing and medical teams.  Please re-consult if needed. Thanks, Maudie Flakes, MSN, RN, [place], Arther Abbott  Pager# [phone]

## 2019-10-14 NOTE — Progress Notes (Signed)
Pt lethargic this morning, arouses but then goes back to sleep.  Able to state name and is in "hospital" in [place] but did not know the name of the hospital.  Knows birthdate.  Bladder scan revealed greater than 943 cc in bladder.  Foley 14 F inserted with urine return.  ABGs done and ph 7.3.  Continuous pulse ox placed and sats 97 on 2L which was started after the ABG done.  Dr. Tyrell Antonio and Dr. Hassell Done here at bedside assessing pt.  Rapid Response called and notified of pt condition in case needs to move.  CBG this am was 301 and pt was covered with 11 units.

## 2019-10-15 LAB — COMPREHENSIVE METABOLIC PANEL
ALT: 50 U/L — ABNORMAL HIGH (ref 0–44)
AST: 20 U/L (ref 15–41)
Albumin: 1.5 g/dL — ABNORMAL LOW (ref 3.5–5.0)
Alkaline Phosphatase: 107 U/L (ref 38–126)
Anion gap: 10 (ref 5–15)
BUN: 50 mg/dL — ABNORMAL HIGH (ref 6–20)
CO2: 18 mmol/L — ABNORMAL LOW (ref 22–32)
Calcium: 7.3 mg/dL — ABNORMAL LOW (ref 8.9–10.3)
Chloride: 108 mmol/L (ref 98–111)
Creatinine, Ser: 4.64 mg/dL — ABNORMAL HIGH (ref 0.61–1.24)
GFR calc Af Amer: 15 mL/min — ABNORMAL LOW (ref >60)
GFR calc non Af Amer: 13 mL/min — ABNORMAL LOW (ref >60)
Glucose, Bld: 85 mg/dL (ref 70–99)
Potassium: 4.4 mmol/L (ref 3.5–5.1)
Sodium: 136 mmol/L (ref 135–145)
Total Bilirubin: 0.6 mg/dL (ref 0.3–1.2)
Total Protein: 5.1 g/dL — ABNORMAL LOW (ref 6.5–8.1)

## 2019-10-15 LAB — GLUCOSE, CAPILLARY
Glucose-Capillary: 75 mg/dL (ref 70–99)
Glucose-Capillary: 76 mg/dL (ref 70–99)
Glucose-Capillary: 239 mg/dL — ABNORMAL HIGH (ref 70–99)
Glucose-Capillary: 233 mg/dL — ABNORMAL HIGH (ref 70–99)
Glucose-Capillary: 264 mg/dL — ABNORMAL HIGH (ref 70–99)

## 2019-10-15 LAB — SEDIMENTATION RATE: Sed Rate: 96 mm/hr — ABNORMAL HIGH (ref 0–16)

## 2019-10-15 MED ORDER — VENLAFAXINE HCL ER 75 MG PO CP24
225.0000 mg | ORAL_CAPSULE | Freq: Every day | ORAL | Status: DC
  Administered 2019-10-16 – 2019-10-18 (×3): 225 mg via ORAL
  Filled 2019-10-15 (×3): qty 3

## 2019-10-15 MED ORDER — OXYCODONE HCL 5 MG PO TABS
5.0000 mg | ORAL_TABLET | ORAL | Status: DC | PRN
  Administered 2019-10-15 – 2019-10-18 (×10): 10 mg via ORAL
  Filled 2019-10-15 (×12): qty 2

## 2019-10-15 MED ORDER — FUROSEMIDE 10 MG/ML IJ SOLN
80.0000 mg | Freq: Two times a day (BID) | INTRAMUSCULAR | Status: DC
  Administered 2019-10-15: 80 mg via INTRAVENOUS
  Filled 2019-10-15: qty 8

## 2019-10-15 MED ORDER — HYDRALAZINE HCL 25 MG PO TABS
25.0000 mg | ORAL_TABLET | Freq: Three times a day (TID) | ORAL | Status: DC
  Administered 2019-10-15 – 2019-10-18 (×9): 25 mg via ORAL
  Filled 2019-10-15 (×9): qty 1

## 2019-10-15 MED ORDER — SODIUM BICARBONATE 650 MG PO TABS
650.0000 mg | ORAL_TABLET | Freq: Two times a day (BID) | ORAL | Status: DC
  Administered 2019-10-15 – 2019-10-18 (×6): 650 mg via ORAL
  Filled 2019-10-15 (×6): qty 1

## 2019-10-15 MED ORDER — RESOURCE THICKENUP CLEAR PO POWD
ORAL | Status: DC | PRN
  Filled 2019-10-15: qty 125

## 2019-10-15 MED ORDER — SODIUM BICARBONATE 650 MG PO TABS
1300.0000 mg | ORAL_TABLET | Freq: Three times a day (TID) | ORAL | Status: DC
  Administered 2019-10-15: 10:00:00 1300 mg via ORAL
  Filled 2019-10-15 (×2): qty 2

## 2019-10-15 MED ORDER — GABAPENTIN 600 MG PO TABS
300.0000 mg | ORAL_TABLET | Freq: Every day | ORAL | Status: DC
  Administered 2019-10-15 – 2019-10-17 (×3): 300 mg via ORAL
  Filled 2019-10-15 (×3): qty 1

## 2019-10-15 NOTE — Progress Notes (Addendum)
[facility] Progress Note  Subjective: in much better mood today, eating, swelling down, 5 L UOP yest on IV lasix. B/Cr stable.  No n/v or confusion.   Vitals:   10/15/19 0024 10/15/19 0500 10/15/19 0551 10/15/19 0753  BP: (!) 169/87  (!) 156/90 (!) 172/87  Pulse: 75  73 78  Resp: '18  18 20  ' Temp: 97.7 F (36.5 C)  (!) 97.4 F (36.3 C) (!) 97.5 F (36.4 C)  TempSrc: Oral  Oral Oral  SpO2: 100%  98% 98%  Weight:  102.5 kg    Height:        Exam: Gen pt awake and alert No jvd or bruits Chest clear bilat to bases RRR no MRG Abd soft ntnd no mass or ascites +bs GU normal male MS no joint effusions or deformity Ext bilat hip edema improved  Neuro is alert, mostly oriented    Home meds:  - lisinopril 10  - olanzapine 5 hs/ trazodone 200 hs/ venlafaxine xr 225 qd/ gabapentin 600 hs and 300 bid/ cabamazepine 600 hs  - insulin degludec 32 u qd       UA 1/29 > protein >300, 0-5 rbc/ wbc, no bacteria    CXR - left sided opacities, ?chronic, R clear     CT chest 1/29 > R >L effusions, GG chagnes/ septal thickening c/w edema, L base consolidation atx vs pna; bilat acute rib fx's and scapular fx      Date               Creat               eGFR   2016              1.39                 54   2019              2.19- 2.78        24- 31   2020              2.02- 2.92        24- 34   1/29               5.06                 14   10/14/19          4.46                 16                          Assessment/ Plan: 1. AoCKD IV - in pt w/ fall at home and traumatic rib/ scapula/ t-spine fractures. Known CKD VI , baseline creat 2.2- 2.7, was f/b nephrologist in [place] before moving to [place] in spring last year, per family. AKI poss cardiorenal+ ACEi. Would dc acei and use other agents for BP control.  Doing better today, great response to IV lasix , will reduce today. Will not need HD there it appears, will need renal f/u for CKD after dc.  Will follow.  2. Hypoalbuminemia - alb  1.5, part of this may be from neph syndrome, don't have any details but is on his list of medical problems and UA shows > 300 3. AMS - doing well today 4. Psych - bipolar/ schizoaffective/ depression, on mult psych meds 5. 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 HTN - avoid acei, on norvasc, will add hydralazine 25 tid     Rob Chimene Salo 10/15/2019, 12:31 PM  Inpatient medications: . amLODipine  10 mg Oral Daily  . Chlorhexidine Gluconate Cloth  6 each Topical Daily  . docusate sodium  100 mg Oral BID  . folic acid  1 mg Intravenous Daily  . furosemide  80 mg Intravenous TID  . heparin  5,000 Units Subcutaneous Q8H  . hydrocerin   Topical BID  . insulin aspart  0-15 Units Subcutaneous TID WC  . insulin aspart  0-5 Units Subcutaneous QHS  . OLANZapine  5 mg Oral QHS  . sodium bicarbonate  1,300 mg Oral TID  . thiamine injection  100 mg Intravenous Daily  . traZODone  200 mg Oral QHS   . acetaminophen 1,000 mg (10/15/19 5612)   acetaminophen, hydrALAZINE, ondansetron **OR** ondansetron (ZOFRAN) IV, oxyCODONE Recent Labs  Lab 10/14/19 1542 10/15/19 0649  NA 135 136  K 4.3 4.4  CL 107 108  CO2 17* 18*  GLUCOSE 100* 85  BUN 52* 50*  CREATININE 4.45* 4.64*  CALCIUM 7.1* 7.3*

## 2019-10-15 NOTE — Progress Notes (Addendum)
Subjective: CC: Rib pain. Patient fixated on wanting to eat. Says that he has mild pain over his ribs. Only requiring tylenol for pain. Denies sob, abdominal pain, cough or other areas of pain. Currently on o2. Denies pain over his scapula. Was found to have new fx at the base of his middle phalanx on the right second digit of his foot on xray. States that he suffers from neuropathy and has a difficult time feeling in that area. He does not have an IS in his room.   Objective: Vital signs in last 24 hours: Temp:  [97.4 F (36.3 C)-99.3 F (37.4 C)] 97.5 F (36.4 C) (01/31 0753) Pulse Rate:  [66-79] 78 (01/31 0753) Resp:  [12-20] 20 (01/31 0753) BP: (137-172)/(69-91) 172/87 (01/31 0753) SpO2:  [96 %-100 %] 98 % (01/31 0753) Weight:  [102 kg-102.5 kg] 102.5 kg (01/31 0500) Last BM Date: (PTA)  Intake/Output from previous day: 01/30 0701 - 01/31 0700 In: 194.5 [IV Piggyback:194.5] Out: 5200 [Urine:5200] Intake/Output this shift: No intake/output data recorded.  PE: Gen:  Alert, NAD HEENT: EOM's intact, pupils equal and round. Poor dentition.  Card:  RRR Pulm:  CTAB, no W/R/R, effort normal. On o2, 2L Abd: Soft, NT/ND, +BS Ext:  Moves b/l UE's and LE's without pain. Pravo boots in place. He has bandage to right foot. No tenderness over UE's or LE's. He reports decreased sensation to LE's. Radial and DP pulses palpable. Psych: A&Ox3  Skin: no rashes noted, warm and dry  Lab Results:  Recent Labs    10/13/19 0237 10/14/19 0554  WBC 8.7 8.0  HGB 12.2* 10.3*  HCT 36.1* 31.4*  PLT 340 296   BMET Recent Labs    10/14/19 1542 10/15/19 0649  NA 135 136  K 4.3 4.4  CL 107 108  CO2 17* 18*  GLUCOSE 100* 85  BUN 52* 50*  CREATININE 4.45* 4.64*  CALCIUM 7.1* 7.3*   PT/INR Recent Labs    10/13/19 0237  LABPROT 12.9  INR 1.0   CMP     Component Value Date/Time   NA 136 10/15/2019 0649   K 4.4 10/15/2019 0649   CL 108 10/15/2019 0649   CO2 18 (L)  10/15/2019 0649   GLUCOSE 85 10/15/2019 0649   BUN 50 (H) 10/15/2019 0649   CREATININE 4.64 (H) 10/15/2019 0649   CALCIUM 7.3 (L) 10/15/2019 0649   PROT 5.1 (L) 10/15/2019 0649   ALBUMIN 1.5 (L) 10/15/2019 0649   AST 20 10/15/2019 0649   ALT 50 (H) 10/15/2019 0649   ALKPHOS 107 10/15/2019 0649   BILITOT 0.6 10/15/2019 0649   GFRNONAA 13 (L) 10/15/2019 0649   GFRAA 15 (L) 10/15/2019 0649   Lipase  No results found for: LIPASE     Studies/Results: US RENAL  Result Date: 10/14/2019 CLINICAL DATA:  AK I on CKD EXAM: RENAL / URINARY TRACT ULTRASOUND COMPLETE COMPARISON:  CT 10/13/2019 FINDINGS: Right Kidney: Renal measurements: 10.1 x 4.5 x 6.0 cm = volume: 141 mL . Echogenicity within normal limits. No mass or hydronephrosis visualized. Left Kidney: Not assessed. Bladder: Not assessed. Other: None. IMPRESSION: 1. No evidence of obstructive uropathy of the right kidney. 2. The left kidney and urinary bladder were not assessed. Patient refused to complete exam. Electronically Signed   By: Davina Poke D.O.   On: 10/14/2019 15:16   DG Foot Complete Right  Result Date: 10/14/2019 CLINICAL DATA:  Cellulitis. EXAM: RIGHT FOOT COMPLETE - 3+ VIEW COMPARISON:  04/27/2019 FINDINGS: Advanced vascular calcifications are noted. There is a fracture at the base of the middle phalanx of the second digit. This is new since prior x-ray. There is nonspecific soft tissue swelling about the foot. Multiple plantar ulcers are noted involving the forefoot. There is no definite radiographic evidence for osteomyelitis. IMPRESSION: New fracture at the base of the middle phalanx of the second digit. Multiple plantar ulcers at the level of the forefoot. No definite radiographic evidence for osteomyelitis. There is nonspecific soft tissue swelling about the foot. Advanced vascular calcifications are noted. Electronically Signed   By: Constance Holster M.D.   On: 10/14/2019 21:39    Anti-infectives: Anti-infectives  (From admission, onward)   None       Assessment/Plan Acute on Chronic Renal Failure  Right foot ulcer HTN CHF DM Hx of CVA CAD Hx of Hep C  - Per TRH -  Age indeterminate C7 superior endplate height loss and R T1-3 TP fractures, T3 superior endplate loss of height - Per NS, Dr. Christella Noa. Per note he does not require bracing. They will see again on Monday.  Concussion - TBI therapies - PT/OT/SLP. PT/OT rec SNF. Concussion clinic as outpt.  L scapular wing fracture - Per Ortho. WBAT LUE. F/u Dr. Doreatha Martin Bilateral rib frx (L-3,5,6,11; R-6,7,9,10) - No PTX/HPTX on CT. Pain control. Pulm toilet. IS/Flutter valve.  Right second digit of foot fx - suspect non-op, buddy tape and post op boot. Can consult Ortho for further recs if further assistance is needed.  FEN - cleared for diet from trauma standpoint. Currently on DYS2. DVT - SCDs, Heparin subq Dispo - Pain well controlled. Only requiring tylenol. We will follow peripherally and be available as needed. I will put in for the patient to have follow up in the concussion clinic. I also put in for follow up for trauma aspects of admission. He will likely require SNF at d/c based on therapy recs.    LOS: 2 days    Jillyn Ledger , [facility] 10/15/2019, 8:49 AM Please see Amion for pager number during day hours 7:00am-4:30pm

## 2019-10-15 NOTE — Progress Notes (Signed)
PROGRESS NOTE    Barry Lewis  BEM:[id number] DOB: 07-12-1961 DOA: 10/13/2019 PCP: Patient, No Pcp Per   Brief Narrative: 59 year old BPM for CVA, schizoaffective bipolar disorder, polysubstance abuse, hyperlipidemia, diabetes, dementia, COPD, diastolic heart failure, chronic back pain presenting after a fall.  Patient fell and he was found more than 24 hours after.  He was in his bathroom and he is slipped.  He was not able to get up.  He called his daughter and she called EMS.  Per daughter he might still be doing drugs.  She relate that he has not been drinking alcohol. Patient was found to have multiple traumatic injuries, ribs, thoracic compression fracture.  Trauma and neurosurgery following.  Patient was found to have acute on chronic renal failure.    Assessment & Plan:   Principal Problem:   Rhabdomyolysis Active Problems:   Chronic diastolic (congestive) heart failure ([place])   Fall at home, initial encounter   Hypertension   Dyslipidemia   Diabetes mellitus without complication (HCC)   Dementia ([place])   COPD (chronic obstructive pulmonary disease) ([place])   CKD stage 4 secondary to hypertension ([place])   Anasarca  1-Acute on chronic renal failure stage IV: Prior creatinine per records 2.8--3.0. Patient also had urine retention, after Foley catheter placed he had 700 cc of urine CK mildly elevated at 649. CK level trending down. Nephrology consulted. Fluids stop. Started on IV lasix.  Good urine out put. Metabolic acidosis improved.  Cr stable at 4.   2-Mild  rhabdomyolysis: Fall.  CK trending down.  Received IV fluids initially.   3-Metabolic acidosis: In the setting of renal failure.  Bicarb gtt stop.  Start sodium bicarb.  Acidosis improved.   4-Acute metabolic encephalopathy: In the setting of metabolic acidosis. Patient very lethargic the morning of 10-14-2019 he was only able to open  eyes to voice and fall back to sleep blood gas showed a pH of 7.3, mild  hypoxemia. CCM was  consulted , some for airway protection if metabolic acidosis continues to worsen. Remain stepdown unit. Alert and conversant today.,   5-Trauma with multiple orthopedic injuries: Bilateral rib fractures, right scapular fracture. Age indeterminate fractures involving the T3 as well as the T1 and T2 right transverse processes. Ortho recommended weightbearing as tolerated on his left upper extremity for scapular fracture Neurosurgery recommends removing the c-collar.  They will follow up on patient on Monday to again.  Right second digit of foot fracture; Multiple foot ulcers;  Awaiting ortho recommendation.   Left pleural effusion;  Monitor for now, might need thoracentesis.  Repeat chest x ray after diuresis. Plan to get chest x ray 10-16-2019.  Destructive process involving Sternum;  Could be old fracture, vs infection, vs mass.  He will need MRI to further evaluate.  ESR elevated at 95, Blood culture; pending.  Will discussed with ortho  Right foot plantar aspect callus, necrosis. Per nurse was draining. X ray multiples ulcer , no osteomyelitis.  Appreciate wound care evaluation.  Will discussed with ortho.   Combined systolic and diastolic heart failure: Last echo at [facility] 04/2019 ejection fraction 45 to 07% and diastolic dysfunction.  Monitor on IV fluids. good urine out put.   Hypertension: Hold lisinopril.  Started  hydralazine continue Norvasc  Dementia bipolar, hold sedative patient lethargic Continue with Zyprexa, trazodone.  Resume Effexor and gabapentin lowe dose.   Diabetes: Continue with Lantus lower dose, sliding scale insulin.  Hold Tyler Aas  Estimated body mass index is 36.47 kg/m as  calculated from the following:   Height as of this encounter: '5\' 6"'  (1.676 m).   Weight as of this encounter: 102.5 kg.   DVT prophylaxis: Heparin Code Status: Full code Family Communication; daughter update Disposition Plan:  From home Place to be discharge;  to be determine.  Remain in the hospital for acute Metabolic encephalopathy, AKI.   Consultants:  CCM Sx Ortho Renal Procedures:   none  Antimicrobials:    Subjective: He is alert today, conversant Feeling better now he can eat   Objective: Vitals:   10/14/19 2003 10/15/19 0024 10/15/19 0500 10/15/19 0551  BP: (!) 155/85 (!) 169/87  (!) 156/90  Pulse: 75 75  73  Resp: '18 18  18  ' Temp: 97.9 F (36.6 C) 97.7 F (36.5 C)  (!) 97.4 F (36.3 C)  TempSrc: Oral Oral  Oral  SpO2: 99% 100%  98%  Weight:   102.5 kg   Height:        Intake/Output Summary (Last 24 hours) at 10/15/2019 0703 Last data filed at 10/15/2019 0555 Gross per 24 hour  Intake 194.49 ml  Output 5200 ml  Net -5005.51 ml   Filed Weights   10/13/19 0151 10/14/19 1236 10/15/19 0500  Weight: 87.5 kg 102 kg 102.5 kg    Examination:  General exam: Alert, follows command Respiratory system: CTA Cardiovascular system: S 1, S 2 RRR Gastrointestinal system: BS resent, soft, distened Central nervous system: alert, following command Extremities: LE multiples wound, necrotic wound plantar aspect right great toe     Data Reviewed: I have personally reviewed following labs and imaging studies  CBC: Recent Labs  Lab 10/13/19 0237 10/14/19 0554  WBC 8.7 8.0  HGB 12.2* 10.3*  HCT 36.1* 31.4*  MCV 96.8 99.1  PLT 340 409   Basic Metabolic Panel: Recent Labs  Lab 10/13/19 0237 10/13/19 0922 10/13/19 1613 10/14/19 0554 10/14/19 1542  NA 133* 135 132* 129* 135  K 5.0 4.8 4.9 4.5 4.3  CL 105 110 105 104 107  CO2 16* 14* 16* 15* 17*  GLUCOSE 306* 242* 229* 349* 100*  BUN 62* 59* 59* 53* 52*  CREATININE 5.06* 4.80* 4.77* 4.46* 4.45*  CALCIUM 7.1* 7.1* 7.1* 6.7* 7.1*   GFR: Estimated Creatinine Clearance: 20.8 mL/min (A) (by C-G formula based on SCr of 4.45 mg/dL (H)). Liver Function Tests: Recent Labs  Lab 10/13/19 0237 10/13/19 0922 10/13/19 1613 10/14/19 0554  AST 61* 38 36 26  ALT  115* 91* 88* 69*  ALKPHOS 138* 117 118 122  BILITOT 0.6 0.5 0.4 0.4  PROT 6.4* 5.4* 5.6* 5.3*  ALBUMIN 2.0* 1.7* 1.7* 1.6*   No results for input(s): LIPASE, AMYLASE in the last 168 hours. No results for input(s): AMMONIA in the last 168 hours. Coagulation Profile: Recent Labs  Lab 10/13/19 0237  INR 1.0   Cardiac Enzymes: Recent Labs  Lab 10/13/19 0237 10/13/19 1613 10/14/19 0554  CKTOTAL 649* 262 177   BNP (last 3 results) No results for input(s): PROBNP in the last 8760 hours. HbA1C: Recent Labs    10/13/19 0922  HGBA1C 8.5*   CBG: Recent Labs  Lab 10/14/19 0747 10/14/19 1145 10/14/19 1639 10/14/19 2123 10/15/19 0549  GLUCAP 301* 245* 75 82 75   Lipid Profile: No results for input(s): CHOL, HDL, LDLCALC, TRIG, CHOLHDL, LDLDIRECT in the last 72 hours. Thyroid Function Tests: No results for input(s): TSH, T4TOTAL, FREET4, T3FREE, THYROIDAB in the last 72 hours. Anemia Panel: No results for input(s): VITAMINB12, FOLATE,  FERRITIN, TIBC, IRON, RETICCTPCT in the last 72 hours. Sepsis Labs: Recent Labs  Lab 10/13/19 0238  LATICACIDVEN 1.1    Recent Results (from the past 240 hour(s))  SARS CORONAVIRUS 2 (TAT 6-24 HRS) Nasopharyngeal Nasopharyngeal Swab     Status: None   Collection Time: 10/13/19  2:38 AM   Specimen: Nasopharyngeal Swab  Result Value Ref Range Status   SARS Coronavirus 2 NEGATIVE NEGATIVE Final    Comment: (NOTE) SARS-CoV-2 target nucleic acids are NOT DETECTED. The SARS-CoV-2 RNA is generally detectable in upper and lower respiratory specimens during the acute phase of infection. Negative results do not preclude SARS-CoV-2 infection, do not rule out co-infections with other pathogens, and should not be used as the sole basis for treatment or other patient management decisions. Negative results must be combined with clinical observations, patient history, and epidemiological information. The expected result is Negative. Fact Sheet for  Patients: SugarRoll.be Fact Sheet for Healthcare Providers: https://www.woods-mathews.com/ This test is not yet approved or cleared by the [place] FDA and  has been authorized for detection and/or diagnosis of SARS-CoV-2 by FDA under an Emergency Use Authorization (EUA). This EUA will remain  in effect (meaning this test can be used) for the duration of the COVID-19 declaration under Section 56 4(b)(1) of the Act, 21 U.S.C. section 360bbb-3(b)(1), unless the authorization is terminated or revoked sooner. Performed at [facility], [place] [address], [place], [place] [address]          Radiology Studies: US RENAL  Result Date: 10/14/2019 CLINICAL DATA:  AK I on CKD EXAM: RENAL / URINARY TRACT ULTRASOUND COMPLETE COMPARISON:  CT 10/13/2019 FINDINGS: Right Kidney: Renal measurements: 10.1 x 4.5 x 6.0 cm = volume: 141 mL . Echogenicity within normal limits. No mass or hydronephrosis visualized. Left Kidney: Not assessed. Bladder: Not assessed. Other: None. IMPRESSION: 1. No evidence of obstructive uropathy of the right kidney. 2. The left kidney and urinary bladder were not assessed. Patient refused to complete exam. Electronically Signed   By: Davina Poke D.O.   On: 10/14/2019 15:16   DG Cerv Spine Flex&Ext Only  Result Date: 10/13/2019 CLINICAL DATA:  Fall.  Back pain. EXAM: CERVICAL SPINE - FLEXION AND EXTENSION VIEWS ONLY COMPARISON:  CT 10/13/2019. FINDINGS: C6 and C7 not completely visualized. Mild prevertebral soft tissue swelling cannot be excluded. Diffuse osteopenia and degenerative change present. No evidence of flexion or extension instability. Reference is made to CT for discussion of cervical and thoracic fractures present. IMPRESSION: 1.  Mild prevertebral soft tissue swelling cannot be excluded. 2. C6 and C7 not completely visualized. No flexion or extension instability. Reference is made to CT for discussion of cervical and  thoracic fractures present. 3.  Diffuse osteopenia and degenerative change. Electronically Signed   By: Marcello Moores  Register   On: 10/13/2019 07:54   DG Foot Complete Right  Result Date: 10/14/2019 CLINICAL DATA:  Cellulitis. EXAM: RIGHT FOOT COMPLETE - 3+ VIEW COMPARISON:  04/27/2019 FINDINGS: Advanced vascular calcifications are noted. There is a fracture at the base of the middle phalanx of the second digit. This is new since prior x-ray. There is nonspecific soft tissue swelling about the foot. Multiple plantar ulcers are noted involving the forefoot. There is no definite radiographic evidence for osteomyelitis. IMPRESSION: New fracture at the base of the middle phalanx of the second digit. Multiple plantar ulcers at the level of the forefoot. No definite radiographic evidence for osteomyelitis. There is nonspecific soft tissue swelling about the foot.  Advanced vascular calcifications are noted. Electronically Signed   By: Constance Holster M.D.   On: 10/14/2019 21:39        Scheduled Meds:  amLODipine  10 mg Oral Daily   Chlorhexidine Gluconate Cloth  6 each Topical Daily   docusate sodium  100 mg Oral BID   folic acid  1 mg Intravenous Daily   furosemide  80 mg Intravenous TID   heparin  5,000 Units Subcutaneous Q8H   hydrocerin   Topical BID   insulin aspart  0-15 Units Subcutaneous TID WC   insulin aspart  0-5 Units Subcutaneous QHS   insulin glargine  15 Units Subcutaneous Daily   OLANZapine  5 mg Oral QHS   sodium bicarbonate  1,300 mg Oral TID   thiamine injection  100 mg Intravenous Daily   traZODone  200 mg Oral QHS   Continuous Infusions:  acetaminophen 1,000 mg (10/15/19 0634)     LOS: 2 days    Time spent: 35 minutes.     Elmarie Shiley, MD [facility]   If 7PM-7AM, please contact night-coverage [email or web address] Password [email or web address] 10/15/2019, 7:03 AM
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
## 2019-10-16 ENCOUNTER — Other Ambulatory Visit: Payer: Self-pay

## 2019-10-16 ENCOUNTER — Inpatient Hospital Stay (HOSPITAL_COMMUNITY): Payer: Medicare Other

## 2019-10-16 LAB — RENAL FUNCTION PANEL
Albumin: 1.5 g/dL — ABNORMAL LOW (ref 3.5–5.0)
Anion gap: 12 (ref 5–15)
BUN: 56 mg/dL — ABNORMAL HIGH (ref 6–20)
CO2: 18 mmol/L — ABNORMAL LOW (ref 22–32)
Calcium: 7.1 mg/dL — ABNORMAL LOW (ref 8.9–10.3)
Chloride: 102 mmol/L (ref 98–111)
Creatinine, Ser: 4.93 mg/dL — ABNORMAL HIGH (ref 0.61–1.24)
GFR calc Af Amer: 14 mL/min — ABNORMAL LOW (ref >60)
GFR calc non Af Amer: 12 mL/min — ABNORMAL LOW (ref >60)
Glucose, Bld: 177 mg/dL — ABNORMAL HIGH (ref 70–99)
Phosphorus: 6.3 mg/dL — ABNORMAL HIGH (ref 2.5–4.6)
Potassium: 4.8 mmol/L (ref 3.5–5.1)
Sodium: 132 mmol/L — ABNORMAL LOW (ref 135–145)

## 2019-10-16 LAB — GLUCOSE, CAPILLARY
Glucose-Capillary: 146 mg/dL — ABNORMAL HIGH (ref 70–99)
Glucose-Capillary: 246 mg/dL — ABNORMAL HIGH (ref 70–99)
Glucose-Capillary: 289 mg/dL — ABNORMAL HIGH (ref 70–99)
Glucose-Capillary: 239 mg/dL — ABNORMAL HIGH (ref 70–99)

## 2019-10-16 MED ORDER — PNEUMOCOCCAL VAC POLYVALENT 25 MCG/0.5ML IJ INJ
0.5000 mL | INJECTION | INTRAMUSCULAR | Status: AC
  Administered 2019-10-17: 10:00:00 0.5 mL via INTRAMUSCULAR
  Filled 2019-10-16: qty 0.5

## 2019-10-16 MED ORDER — FOLIC ACID 1 MG PO TABS
1.0000 mg | ORAL_TABLET | Freq: Every day | ORAL | Status: DC
  Administered 2019-10-16 – 2019-10-18 (×3): 1 mg via ORAL
  Filled 2019-10-16 (×3): qty 1

## 2019-10-16 MED ORDER — INFLUENZA VAC SPLIT QUAD 0.5 ML IM SUSY
0.5000 mL | PREFILLED_SYRINGE | INTRAMUSCULAR | Status: AC
  Administered 2019-10-17: 0.5 mL via INTRAMUSCULAR
  Filled 2019-10-16: qty 0.5

## 2019-10-16 MED ORDER — THIAMINE HCL 100 MG PO TABS
100.0000 mg | ORAL_TABLET | Freq: Every day | ORAL | Status: DC
  Administered 2019-10-17 – 2019-10-18 (×2): 100 mg via ORAL
  Filled 2019-10-16 (×2): qty 1

## 2019-10-16 MED ORDER — INSULIN GLARGINE 100 UNIT/ML ~~LOC~~ SOLN
15.0000 [IU] | Freq: Every day | SUBCUTANEOUS | Status: DC
  Administered 2019-10-16 – 2019-10-18 (×3): 15 [IU] via SUBCUTANEOUS
  Filled 2019-10-16 (×3): qty 0.15

## 2019-10-16 MED ORDER — ENSURE ENLIVE PO LIQD
237.0000 mL | Freq: Two times a day (BID) | ORAL | Status: DC
  Administered 2019-10-16 – 2019-10-18 (×5): 237 mL via ORAL

## 2019-10-16 MED ORDER — FUROSEMIDE 10 MG/ML IJ SOLN
80.0000 mg | Freq: Every day | INTRAMUSCULAR | Status: DC
  Administered 2019-10-16: 80 mg via INTRAVENOUS
  Filled 2019-10-16 (×2): qty 8

## 2019-10-16 NOTE — Progress Notes (Signed)
Inpatient Rehabilitation Admissions Coordinator  Inpatient rehab consult received. I will meet with patient at bedside tomorrow for rehab assessment.  Danne Baxter, RN, MSN Rehab Admissions Coordinator [phone] 10/16/2019 7:44 PM

## 2019-10-16 NOTE — Progress Notes (Signed)
Modified Barium Swallow Progress Note  Patient Details  Name: Barry Lewis MRN: [id number] Date of Birth: November 21, 1960  Today's Date: 10/16/2019  Modified Barium Swallow completed.  Full report located under Chart Review in the Imaging Section.  Brief recommendations include the following:  Clinical Impression Patient presents with mild oral dysphagia secondary to reduced lingual control and lingual pumping with premature spilage to the vallecula and the pyriform sinuses. Pharyngeal phase was unremarkable. Pt did report that prior to the study, he felt like some things were getting stuck lower in his throat or stomach, but did not have that feeling during the study. His esophagus appeared to be clear during the esophageal sweep. Pt may utilize a liquid wash as needed. Regular diet with thin liquids, with intermittent supervision recommended at this time.   Swallow Evaluation Recommendations   SLP Diet Recommendations: Regular solids;Thin liquid   Liquid Administration via: Straw;Cup   Medication Administration: Whole meds with liquid   Supervision: Intermittent supervision to cue for compensatory strategies   Compensations: Minimize environmental distractions;Slow rate;Small sips/bites;Follow solids with liquid   Postural Changes: Seated upright at 90 degrees   Oral Care Recommendations: Oral care BID      Aline August, Student SLP Office: [phone]  10/16/2019,9:33 AM

## 2019-10-16 NOTE — TOC Initial Note (Signed)
Transition of Care [facility]) - Initial/Assessment Note    Patient Details  Name: Barry Lewis MRN: [id number] Date of Birth: Sep 26, 1960  [facility] CM/SW Contact:    Carles Collet, RN Phone Number: 10/16/2019, 11:48 AM  Clinical Narrative:            Barry Lewis w patient at bedside. He states that he has been up in his room to the chair and up to the sink to brush his teeth. He states that he wants to go home at discharge.  We discussed HH vs SNF and he confirms his choice is home. He does not have a preference of agency and referral made to [facility].    Spoke to his daughter with his permission, she states that she checks in on him a few times a week. She asked for assistance with ALF placement. SW services through [employer] is liked into several ALFs in his area per liaison so they will able to help with ALF placement after DC. Daughter aware of this and grateful.    Expected Discharge Plan: [place] Barriers to Discharge: Continued Medical Work up   Patient Goals and CMS Choice Patient states their goals for this hospitalization and ongoing recovery are:: to go back to my house CMS Medicare.gov Compare Post Acute Care list provided to:: Patient Choice offered to / list presented to : Patient  Expected Discharge Plan and Services Expected Discharge Plan: [place]   Discharge Planning Services: CM Consult Post Acute Care Choice: [place] arrangements for the past 2 months: Single Family Home                           HH Arranged: RN, PT, OT, Nurse's Aide, Social Work [employer] Agency: [place] Date [place]: 10/16/19 Time [place]: 40 Representative spoke with at [place]: cheryl  Prior Living Arrangements/Services Living arrangements for the past 2 months: [place] with:: Self                   Activities of Daily Living Home Assistive Devices/Equipment: Cane  (specify quad or straight) ADL Screening (condition at time of admission) Patient's cognitive ability adequate to safely complete daily activities?: Yes Is the patient deaf or have difficulty hearing?: No Does the patient have difficulty seeing, even when wearing glasses/contacts?: No Does the patient have difficulty concentrating, remembering, or making decisions?: Yes Patient able to express need for assistance with ADLs?: Yes Does the patient have difficulty dressing or bathing?: Yes Independently performs ADLs?: No Communication: Independent Dressing (OT): Needs assistance Is this a change from baseline?: Change from baseline, expected to last >3 days Grooming: Needs assistance Is this a change from baseline?: Change from baseline, expected to last >3 days Feeding: Needs assistance Is this a change from baseline?: Change from baseline, expected to last >3 days Bathing: Needs assistance Is this a change from baseline?: Change from baseline, expected to last >3 days Toileting: Needs assistance Is this a change from baseline?: Change from baseline, expected to last >3days In/Out Bed: Needs assistance Is this a change from baseline?: Change from baseline, expected to last >3 days Walks in Home: Dependent Is this a change from baseline?: Change from baseline, expected to last >3 days Does the patient have difficulty walking or climbing stairs?: Yes Weakness of Legs: Both Weakness of Arms/Hands: Both  Permission Sought/Granted  Emotional Assessment              Admission diagnosis:  Cervicalgia [M54.2] Rhabdomyolysis [M62.82] Dehydration [E86.0] Anasarca [R60.1] Trauma [T14.90XA] Pleural effusion [J90] Hyperglycemia [R73.9] AKI (acute kidney injury) ([place]) [N17.9] Concussion with loss of consciousness, initial encounter [S06.0X9A] Closed fracture of multiple ribs of both sides, initial encounter [S22.43XA] Closed nondisplaced fracture of seventh  cervical vertebra, unspecified fracture morphology, initial encounter ([place]) [S12.601A] Compression fracture of T3 vertebra, initial encounter [facility]) [id number] Patient Active Problem List   Diagnosis Date Noted  . Anasarca   . Rhabdomyolysis 10/13/2019  . Fall at home, initial encounter 10/13/2019  . Hypertension   . Dyslipidemia   . Diabetes mellitus without complication ([place])   . Dementia ([place])   . COPD (chronic obstructive pulmonary disease) ([place])   . CKD stage 4 secondary to hypertension ([place])   . Chronic diastolic (congestive) heart failure [facility])    PCP:  Patient, No Pcp Per Pharmacy:   [place], [place] [address] [place] [address] Phone: [phone] Fax: [phone]     Social Determinants of Health (SDOH) Interventions    Readmission Risk Interventions No flowsheet data found.
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
## 2019-10-16 NOTE — Progress Notes (Signed)
Chaplain stopped in to discuss Advanced Directive in response to consult with Barry Lewis but he was sleeping.    Chaplain will follow-up.

## 2019-10-16 NOTE — Plan of Care (Signed)

## 2019-10-16 NOTE — Progress Notes (Signed)
Physical Therapy Treatment Patient Details Name: Barry Lewis MRN: [id number] DOB: [birth year] Today's Date: 10/16/2019    History of Present Illness Pt is a 59 y/o male admitted secondary to sustaining a fall and being found down for several hours. He was found to have multiple injuries, including acute rib fractures, scapular fracture, T3 compression fracture and transverse process fractures at T1-T2.  He has been evaluated by neurosurgery, and orthopedic surgery and it appears that conservative treatment has been recommended for these fractures. PMH including but not limited to CVA; schizoaffective/bipolar disorder; polysubstance abuse; HLD; DM; dementia; COPD; chronic diastolic CHF.    PT Comments    Pt not agitated today, oriented and is aware of deficits and situation. Pt co-operative with PT/OT. Pt with severe bilat hand and feet neuropathy limiting fine motor planning and control. Pt requiring modA for transfers and assist and RW for ambulation. Pt was indep and amb with cane in community PTA. Pt unsafe to return home alone as pt at high falls risk. Pt already with fractured ribs, T1-2 fracture and R 2nd toe fracture. Anticipate pt can achieve safe mod I level of function during CIR stay has pt much improved from Saturday. If pt is denied CIR pt will then need ST-SNF upon d/c to allow for time to achieve safe mod I level of function. Acute PT to cont to follow.    Follow Up Recommendations  CIR     Equipment Recommendations  Rolling walker with 5" wheels    Recommendations for Other Services       Precautions / Restrictions Precautions Precautions: Fall;Back Precaution Comments: educated on back precautions for comfort due to fracture Restrictions Weight Bearing Restrictions: Yes LUE Weight Bearing: Weight bearing as tolerated RLE Weight Bearing: Weight bearing as tolerated    Mobility  Bed Mobility Overal bed mobility: Needs Assistance Bed Mobility: Rolling;Sidelying to  Sit Rolling: +2 for physical assistance;Min assist Sidelying to sit: Mod assist;+2 for physical assistance       General bed mobility comments: max directional verbal cues, increased time due to pain, modAx2 for trunk elevation due to back pain  Transfers Overall transfer level: Needs assistance Equipment used: Rolling walker (2 wheeled) Transfers: Sit to/from Stand Sit to Stand: Min assist;+2 physical assistance         General transfer comment: max verbal cues to push up from bed not pull up on walker, increased time, minAX2 to steady during transition of hands, pt with increased trunk flexion, increased time required  Ambulation/Gait Ambulation/Gait assistance: Min assist;+2 physical assistance Gait Distance (Feet): 20 Feet(in room, to sink then to chair) Assistive device: Rolling walker (2 wheeled) Gait Pattern/deviations: Step-through pattern;Decreased stride length Gait velocity: slow Gait velocity interpretation: <1.8 ft/sec, indicate of risk for recurrent falls General Gait Details: pt slow, unsteady with ant/posterior sway requiring minA to steady, miA For walker management especially during turning and around obstacles   Stairs             Wheelchair Mobility    Modified Rankin (Stroke Patients Only)       Balance Overall balance assessment: Needs assistance Sitting-balance support: Feet supported;No upper extremity supported Sitting balance-Leahy Scale: Fair Sitting balance - Comments: attempted to don socks with OT however unable and with significant posterior lean   Standing balance support: Single extremity supported Standing balance-Leahy Scale: Fair Standing balance comment: pt brushed teeth at sink but required leaning up against sink to maintain balance  Cognition Arousal/Alertness: Awake/alert Behavior During Therapy: WFL for tasks assessed/performed Overall Cognitive Status: Within Functional Limits for  tasks assessed                                 General Comments: pt much better cognitively today, pt not agitated, oriented, and aware of his deficits. Pt able to recall incident. Pt aware he is unsafe to return home alone in his condidtion      Exercises      General Comments General comments (skin integrity, edema, etc.): Pt with dressing around R foot , multiple wounds on knees/LEs due to fall, covered with dressings      Pertinent Vitals/Pain Pain Assessment: Faces Faces Pain Scale: Hurts little more Pain Location: back Pain Descriptors / Indicators: Discomfort Pain Intervention(s): Monitored during session    Home Living                      Prior Function            PT Goals (current goals can now be found in the care plan section) Acute Rehab PT Goals Patient Stated Goal: get better Progress towards PT goals: Progressing toward goals    Frequency    Min 3X/week      PT Plan Frequency needs to be updated    Co-evaluation PT/OT/SLP Co-Evaluation/Treatment: Yes Reason for Co-Treatment: Complexity of the patient's impairments (multi-system involvement);Necessary to address cognition/behavior during functional activity PT goals addressed during session: Mobility/safety with mobility        AM-PAC PT "6 Clicks" Mobility   Outcome Measure  Help needed turning from your back to your side while in a flat bed without using bedrails?: A Lot Help needed moving from lying on your back to sitting on the side of a flat bed without using bedrails?: A Lot Help needed moving to and from a bed to a chair (including a wheelchair)?: A Little Help needed standing up from a chair using your arms (e.g., wheelchair or bedside chair)?: A Little Help needed to walk in hospital room?: A Little Help needed climbing 3-5 steps with a railing? : A Lot 6 Click Score: 15    End of Session Equipment Utilized During Treatment: Gait belt Activity Tolerance:  Patient tolerated treatment well Patient left: in chair;with call bell/phone within reach;with chair alarm set Nurse Communication: Mobility status PT Visit Diagnosis: Other abnormalities of gait and mobility (R26.89)     Time: [id number] PT Time Calculation (min) (ACUTE ONLY): 30 min  Charges:  $Gait Training: 8-22 mins                     Kittie Plater, PT, DPT Acute Rehabilitation Services Pager #: [phone] Office #: [phone]    Berline Lopes 10/16/2019, 11:48 AM
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
## 2019-10-16 NOTE — Plan of Care (Signed)
  Problem: Safety: Goal: Ability to remain free from injury will improve Outcome: Progressing   Problem: Skin Integrity: Goal: Risk for impaired skin integrity will decrease Outcome: Progressing   Problem: Pain Managment: Goal: General experience of comfort will improve Outcome: Progressing   Problem: Activity: Goal: Risk for activity intolerance will decrease Outcome: Progressing

## 2019-10-16 NOTE — Progress Notes (Signed)
Patient ID: Barry Lewis, male   DOB: Oct 20, 1960, 59 y.o.   MRN: [id number]  BP (!) 172/87 (BP Location: Left Arm)   Pulse 79   Temp 98.8 F (37.1 C) (Oral)   Resp 18   Ht 5\' 6"  (1.676 m)   Wt 102.4 kg   SpO2 94%   BMI 36.44 kg/m  Alert and oriented x 4, speech is clear and fluent Moving all extremities well Doing better No new recommendAtions
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
## 2019-10-16 NOTE — Progress Notes (Signed)
Patient states he would like to talk to a Education officer, museum for medication assistance and transportation assistance, RN saw that consult was already placed on yesterday.

## 2019-10-17 DIAGNOSIS — E1142 Type 2 diabetes mellitus with diabetic polyneuropathy: Secondary | ICD-10-CM

## 2019-10-17 DIAGNOSIS — E08621 Diabetes mellitus due to underlying condition with foot ulcer: Secondary | ICD-10-CM

## 2019-10-17 DIAGNOSIS — L97411 Non-pressure chronic ulcer of right heel and midfoot limited to breakdown of skin: Secondary | ICD-10-CM

## 2019-10-17 DIAGNOSIS — E43 Unspecified severe protein-calorie malnutrition: Secondary | ICD-10-CM

## 2019-10-17 LAB — CBC
HCT: 35 % — ABNORMAL LOW (ref 39.0–52.0)
Hemoglobin: 11.4 g/dL — ABNORMAL LOW (ref 13.0–17.0)
MCH: 32.2 pg (ref 26.0–34.0)
MCHC: 32.6 g/dL (ref 30.0–36.0)
MCV: 98.9 fL (ref 80.0–100.0)
Platelets: 235 10*3/uL (ref 150–400)
RBC: 3.54 MIL/uL — ABNORMAL LOW (ref 4.22–5.81)
RDW: 14 % (ref 11.5–15.5)
WBC: 5.6 10*3/uL (ref 4.0–10.5)
nRBC: 0 % (ref 0.0–0.2)

## 2019-10-17 LAB — PROTEIN / CREATININE RATIO, URINE
Creatinine, Urine: 26.84 mg/dL
Protein Creatinine Ratio: 9.09 mg/mg{Cre} — ABNORMAL HIGH (ref 0.00–0.15)
Total Protein, Urine: 244 mg/dL

## 2019-10-17 LAB — RENAL FUNCTION PANEL
Albumin: 1.4 g/dL — ABNORMAL LOW (ref 3.5–5.0)
Anion gap: 13 (ref 5–15)
BUN: 61 mg/dL — ABNORMAL HIGH (ref 6–20)
CO2: 21 mmol/L — ABNORMAL LOW (ref 22–32)
Calcium: 7.2 mg/dL — ABNORMAL LOW (ref 8.9–10.3)
Chloride: 102 mmol/L (ref 98–111)
Creatinine, Ser: 4.92 mg/dL — ABNORMAL HIGH (ref 0.61–1.24)
GFR calc Af Amer: 14 mL/min — ABNORMAL LOW (ref >60)
GFR calc non Af Amer: 12 mL/min — ABNORMAL LOW (ref >60)
Glucose, Bld: 144 mg/dL — ABNORMAL HIGH (ref 70–99)
Phosphorus: 6.2 mg/dL — ABNORMAL HIGH (ref 2.5–4.6)
Potassium: 4.7 mmol/L (ref 3.5–5.1)
Sodium: 136 mmol/L (ref 135–145)

## 2019-10-17 LAB — GLUCOSE, CAPILLARY
Glucose-Capillary: 137 mg/dL — ABNORMAL HIGH (ref 70–99)
Glucose-Capillary: 187 mg/dL — ABNORMAL HIGH (ref 70–99)
Glucose-Capillary: 289 mg/dL — ABNORMAL HIGH (ref 70–99)
Glucose-Capillary: 230 mg/dL — ABNORMAL HIGH (ref 70–99)

## 2019-10-17 MED ORDER — FLUTICASONE PROPIONATE 50 MCG/ACT NA SUSP
1.0000 | Freq: Every day | NASAL | Status: DC
  Administered 2019-10-17 – 2019-10-18 (×2): 1 via NASAL
  Filled 2019-10-17 (×2): qty 16

## 2019-10-17 MED ORDER — FUROSEMIDE 80 MG PO TABS
80.0000 mg | ORAL_TABLET | Freq: Two times a day (BID) | ORAL | Status: DC
  Administered 2019-10-17 – 2019-10-18 (×3): 80 mg via ORAL
  Filled 2019-10-17 (×3): qty 1

## 2019-10-17 NOTE — Progress Notes (Signed)
Spoke to patient's sister in law Debbie and updated her on patient's current progress.

## 2019-10-17 NOTE — TOC Progression Note (Signed)
Transition of Care [facility]) - Progression Note    Patient Details  Name: Barry Lewis MRN: [id number] Date of Birth: [birth year]  Transition of Care [facility]) CM/SW Contact  Zenon Mayo, RN Phone Number: 10/17/2019, 3:52 PM  Clinical Narrative:    NCM spoke with patient , he states he has a PCP, Dr. Birdena Crandall.  He states he has no transportation to get his medications.  NCM gave his information to the [facility] to have his meds delivered to him at home.  NCM also made referral to [facility] with [place]. She states Dr. Birdena Crandall is not in their network so she would not be able to assist him.  Looks like CIR will try to take patient tomorrow per Barbara's note.    Expected Discharge Plan: [place] Barriers to Discharge: Continued Medical Work up  Expected Discharge Plan and Services Expected Discharge Plan: [place]   Discharge Planning Services: CM Consult Post Acute Care Choice: [place] arrangements for the past 2 months: Single Family Home                           HH Arranged: RN, PT, OT, Nurse's Aide, Social Work [employer] Agency: Box Date [facility] Agency Contacted: 10/16/19 Time [place]: [id number] Representative spoke with at [place]: cheryl   Social Determinants of Health ([place]) Interventions    Readmission Risk Interventions No flowsheet data found.
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
## 2019-10-17 NOTE — Progress Notes (Signed)
  Speech Language Pathology Treatment: Dysphagia  Patient Details Name: Barry Lewis MRN: [id number] DOB: 09-04-1961 Today's Date: 10/17/2019 Time: 1140-1200 SLP Time Calculation (min) (ACUTE ONLY): 20 min  Assessment / Plan / Recommendation Clinical Impression  Patient received in room, sitting upright in chair. Patient alert and cooperative. ST provided education and review of results from patient participation in [place] yesterday. Patient verbalized understanding and demonstrated ability to recall and state general understanding of results and recommendations in teach-back. Patient seen with thin liquids (water), good oral acceptance, no overt s/sx aspiration during or after the swallow. Patient seen w/ regular solids (graham cracker). Pt with adequate oral acceptance, mildly prolonged mastication and minimal oral residue on central sulcus of tongue following the swallow. Pt aware of residue and able to clear w/ additional swallow. ST provided edu to patient re: aspiration precautions/strategies: sitting upright for all PO intake, taking small bites/sips, reducing environmental distractions. He verbalized understanding.  Recommend: regular solids/thin liquids with intermittent supervision to ensure patient adherence to aspiration precautions/swallow strategies.  Patient is appropriate for discharge for dysphagia services at this time. ST will continue to follow acutely for cognitive-linguistic goals.    HPI HPI: 13M s/p unwitnessed fall with unknown loss of consciousness, possibly down for up to 24h. The patient is repetitive and is only able to minimally contribute to the history. From chart review, he has a h/o CVA, schizoaffective/bipolar disorder, polysubstance abuse, HLD, DM, dementia, COPD, and chronic diastolic CHF.  Pt with significant event on 1/30 that resulted in diet change to NPO.        SLP Plan  Other (Comment)(Discharge for swallowing. Remain on caseload for SLE)        Recommendations  Diet recommendations: Regular;Thin liquid Medication Administration: Whole meds with liquid Supervision: Intermittent supervision to cue for compensatory strategies Compensations: Minimize environmental distractions;Slow rate;Small sips/bites;Follow solids with liquid Postural Changes and/or Swallow Maneuvers: Seated upright 90 degrees;Upright 30-60 min after meal                Oral Care Recommendations: Oral care BID Follow up Recommendations: Skilled Nursing facility SLP Visit Diagnosis: Dysphagia, oral phase (R13.11) Plan: Other (Comment)(Discharge for swallowing. Remain on caseload for SLE)       [place], M.Ed., [place] [facility] Acute Rehabilitation [phone]: Acute Rehab office [phone] - pager    Marina Goodell 10/17/2019, 12:41 PM

## 2019-10-17 NOTE — Plan of Care (Signed)
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
## 2019-10-17 NOTE — Progress Notes (Signed)
Physical Therapy Treatment Patient Details Name: Barry Lewis MRN: [id number] DOB: May 21, 1961 Today's Date: 10/17/2019    History of Present Illness Pt is a 59 y/o male admitted secondary to sustaining a fall and being found down for several hours. He was found to have multiple injuries, including acute rib fractures, scapular fracture, T3 compression fracture and transverse process fractures at T1-T2.  He has been evaluated by neurosurgery, and orthopedic surgery and it appears that conservative treatment has been recommended for these fractures. PMH including but not limited to CVA; schizoaffective/bipolar disorder; polysubstance abuse; HLD; DM; dementia; COPD; chronic diastolic CHF.    PT Comments    Patient progressing well towards PT goals. Cognition seems to be improving as well as pt oriented, cooperative and eager today. Continue to question safety awareness as pt stating different wants with regards to discharge. Improved ambulation distance with Min A for balance/safety with chair follow due to dizziness, sway and bil knee instability. Sp02 dropped to 85% on RA during activity, recovered quickly. Question some vestibular dysfunction as pt reports ever since his fall (bumped head twice in fall), he has had the dizziness which is worse with changes in position. Recommend vestibular evaluation. Will continue to follow.    Follow Up Recommendations  CIR     Equipment Recommendations  Rolling walker with 5" wheels    Recommendations for Other Services       Precautions / Restrictions Precautions Precautions: Fall;Back Precaution Booklet Issued: No Precaution Comments: Reviewed log roll for comfort due to fracture Restrictions Weight Bearing Restrictions: No    Mobility  Bed Mobility Overal bed mobility: Needs Assistance Bed Mobility: Rolling;Sidelying to Sit Rolling: Min guard Sidelying to sit: Min assist;HOB elevated       General bed mobility comments: Cues for log roll  technique, increased time and light Min A to elevate trunk. + dizziness sitting EOB.  Transfers Overall transfer level: Needs assistance Equipment used: Rolling walker (2 wheeled) Transfers: Sit to/from Stand Sit to Stand: Min assist         General transfer comment: Assist to power to standing with cues for hand placement; stood from EOB x1, from chair x1, transferred to chair post ambulation. Cues for upright. Mild truncal ataxia noted. + dizziness.  Ambulation/Gait Ambulation/Gait assistance: Min assist;+2 safety/equipment Gait Distance (Feet): 100 Feet(x2 bouts) Assistive device: Rolling walker (2 wheeled) Gait Pattern/deviations: Step-through pattern;Decreased stride length;Trunk flexed Gait velocity: varying speeds   General Gait Details: Unsteady gait with ant/posterior sway requiring Min A to steady; bil knee instability noted but no buckling. Sp02 dropped to 85% on RA, recovered quickly with seated rest break.   Stairs             Wheelchair Mobility    Modified Rankin (Stroke Patients Only)       Balance Overall balance assessment: Needs assistance Sitting-balance support: Feet supported;No upper extremity supported Sitting balance-Leahy Scale: Fair     Standing balance support: During functional activity Standing balance-Leahy Scale: Poor Standing balance comment: Requires UE support in standing.                            Cognition Arousal/Alertness: Awake/alert Behavior During Therapy: WFL for tasks assessed/performed Overall Cognitive Status: Within Functional Limits for tasks assessed                                 General Comments:  Cognition appears better today; cooperative, willing, oriented. Recalling events of fall and symptoms etc. Question safety awareness as pt states he will go to rehab to 1 therapist but then to CM, states he is going home.      Exercises      General Comments General comments (skin  integrity, edema, etc.): Dressing around right foot and prevalon boots donned upon arrival.      Pertinent Vitals/Pain Pain Assessment: 0-10 Pain Score: 4  Pain Location: back, ribs Pain Descriptors / Indicators: Discomfort Pain Intervention(s): Premedicated before session;Repositioned;Monitored during session    Home Living                      Prior Function            PT Goals (current goals can now be found in the care plan section) Progress towards PT goals: Progressing toward goals    Frequency    Min 3X/week      PT Plan Current plan remains appropriate    Co-evaluation              AM-PAC PT "6 Clicks" Mobility   Outcome Measure  Help needed turning from your back to your side while in a flat bed without using bedrails?: A Little Help needed moving from lying on your back to sitting on the side of a flat bed without using bedrails?: A Little Help needed moving to and from a bed to a chair (including a wheelchair)?: A Little Help needed standing up from a chair using your arms (e.g., wheelchair or bedside chair)?: A Little Help needed to walk in hospital room?: A Little Help needed climbing 3-5 steps with a railing? : A Lot 6 Click Score: 17    End of Session Equipment Utilized During Treatment: Gait belt Activity Tolerance: Patient tolerated treatment well Patient left: in chair;with call bell/phone within reach;with chair alarm set Nurse Communication: Mobility status PT Visit Diagnosis: Other abnormalities of gait and mobility (R26.89)     Time: [id number] PT Time Calculation (min) (ACUTE ONLY): 24 min  Charges:  $Gait Training: 8-22 mins $Therapeutic Activity: 8-22 mins                     [name], PT, DPT Acute Rehabilitation Services Pager [phone] Office [phone]       Marguarite Arbour A [place] 10/17/2019, 12:12 PM
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
## 2019-10-18 ENCOUNTER — Encounter (HOSPITAL_COMMUNITY): Payer: Self-pay | Admitting: Physical Medicine & Rehabilitation

## 2019-10-18 ENCOUNTER — Inpatient Hospital Stay (HOSPITAL_COMMUNITY)
Admission: RE | Admit: 2019-10-18 | Discharge: 2019-11-03 | DRG: 945 | Disposition: A | Discharge status: Home Health | Payer: Medicare Other | Source: Intra-hospital | Attending: Physical Medicine & Rehabilitation | Admitting: Physical Medicine & Rehabilitation

## 2019-10-18 DIAGNOSIS — T07XXXA Unspecified multiple injuries, initial encounter: Secondary | ICD-10-CM | POA: Diagnosis present

## 2019-10-18 DIAGNOSIS — M549 Dorsalgia, unspecified: Secondary | ICD-10-CM | POA: Diagnosis present

## 2019-10-18 DIAGNOSIS — M25362 Other instability, left knee: Secondary | ICD-10-CM | POA: Diagnosis present

## 2019-10-18 DIAGNOSIS — E785 Hyperlipidemia, unspecified: Secondary | ICD-10-CM

## 2019-10-18 DIAGNOSIS — I10 Essential (primary) hypertension: Secondary | ICD-10-CM

## 2019-10-18 DIAGNOSIS — I5042 Chronic combined systolic (congestive) and diastolic (congestive) heart failure: Secondary | ICD-10-CM

## 2019-10-18 DIAGNOSIS — I493 Ventricular premature depolarization: Secondary | ICD-10-CM | POA: Diagnosis not present

## 2019-10-18 DIAGNOSIS — E871 Hypo-osmolality and hyponatremia: Secondary | ICD-10-CM | POA: Diagnosis not present

## 2019-10-18 DIAGNOSIS — F250 Schizoaffective disorder, bipolar type: Secondary | ICD-10-CM | POA: Diagnosis present

## 2019-10-18 DIAGNOSIS — Z888 Allergy status to other drugs, medicaments and biological substances status: Secondary | ICD-10-CM

## 2019-10-18 DIAGNOSIS — Y92009 Unspecified place in unspecified non-institutional (private) residence as the place of occurrence of the external cause: Secondary | ICD-10-CM

## 2019-10-18 DIAGNOSIS — N184 Chronic kidney disease, stage 4 (severe): Secondary | ICD-10-CM | POA: Diagnosis present

## 2019-10-18 DIAGNOSIS — E8809 Other disorders of plasma-protein metabolism, not elsewhere classified: Secondary | ICD-10-CM | POA: Diagnosis present

## 2019-10-18 DIAGNOSIS — S22011D Stable burst fracture of first thoracic vertebra, subsequent encounter for fracture with routine healing: Secondary | ICD-10-CM | POA: Diagnosis not present

## 2019-10-18 DIAGNOSIS — S2231XD Fracture of one rib, right side, subsequent encounter for fracture with routine healing: Secondary | ICD-10-CM

## 2019-10-18 DIAGNOSIS — Z9119 Patient's noncompliance with other medical treatment and regimen: Secondary | ICD-10-CM | POA: Diagnosis not present

## 2019-10-18 DIAGNOSIS — Z6830 Body mass index (BMI) 30.0-30.9, adult: Secondary | ICD-10-CM

## 2019-10-18 DIAGNOSIS — N2581 Secondary hyperparathyroidism of renal origin: Secondary | ICD-10-CM | POA: Diagnosis present

## 2019-10-18 DIAGNOSIS — S22021D Stable burst fracture of second thoracic vertebra, subsequent encounter for fracture with routine healing: Secondary | ICD-10-CM

## 2019-10-18 DIAGNOSIS — L97519 Non-pressure chronic ulcer of other part of right foot with unspecified severity: Secondary | ICD-10-CM | POA: Diagnosis present

## 2019-10-18 DIAGNOSIS — Z7141 Alcohol abuse counseling and surveillance of alcoholic: Secondary | ICD-10-CM

## 2019-10-18 DIAGNOSIS — E1165 Type 2 diabetes mellitus with hyperglycemia: Secondary | ICD-10-CM | POA: Diagnosis not present

## 2019-10-18 DIAGNOSIS — Z8673 Personal history of transient ischemic attack (TIA), and cerebral infarction without residual deficits: Secondary | ICD-10-CM

## 2019-10-18 DIAGNOSIS — S12601A Unspecified nondisplaced fracture of seventh cervical vertebra, initial encounter for closed fracture: Secondary | ICD-10-CM

## 2019-10-18 DIAGNOSIS — R4781 Slurred speech: Secondary | ICD-10-CM | POA: Diagnosis present

## 2019-10-18 DIAGNOSIS — Z79899 Other long term (current) drug therapy: Secondary | ICD-10-CM

## 2019-10-18 DIAGNOSIS — I129 Hypertensive chronic kidney disease with stage 1 through stage 4 chronic kidney disease, or unspecified chronic kidney disease: Secondary | ICD-10-CM

## 2019-10-18 DIAGNOSIS — Z794 Long term (current) use of insulin: Secondary | ICD-10-CM

## 2019-10-18 DIAGNOSIS — G9349 Other encephalopathy: Secondary | ICD-10-CM | POA: Diagnosis present

## 2019-10-18 DIAGNOSIS — N049 Nephrotic syndrome with unspecified morphologic changes: Secondary | ICD-10-CM | POA: Diagnosis present

## 2019-10-18 DIAGNOSIS — S2243XA Multiple fractures of ribs, bilateral, initial encounter for closed fracture: Secondary | ICD-10-CM

## 2019-10-18 DIAGNOSIS — F17210 Nicotine dependence, cigarettes, uncomplicated: Secondary | ICD-10-CM | POA: Diagnosis present

## 2019-10-18 DIAGNOSIS — M6282 Rhabdomyolysis: Secondary | ICD-10-CM

## 2019-10-18 DIAGNOSIS — E1142 Type 2 diabetes mellitus with diabetic polyneuropathy: Secondary | ICD-10-CM

## 2019-10-18 DIAGNOSIS — S2232XD Fracture of one rib, left side, subsequent encounter for fracture with routine healing: Secondary | ICD-10-CM | POA: Diagnosis not present

## 2019-10-18 DIAGNOSIS — E875 Hyperkalemia: Secondary | ICD-10-CM | POA: Diagnosis not present

## 2019-10-18 DIAGNOSIS — F0390 Unspecified dementia without behavioral disturbance: Secondary | ICD-10-CM | POA: Diagnosis present

## 2019-10-18 DIAGNOSIS — E1122 Type 2 diabetes mellitus with diabetic chronic kidney disease: Secondary | ICD-10-CM | POA: Diagnosis present

## 2019-10-18 DIAGNOSIS — R072 Precordial pain: Secondary | ICD-10-CM | POA: Diagnosis not present

## 2019-10-18 DIAGNOSIS — G8929 Other chronic pain: Secondary | ICD-10-CM | POA: Diagnosis present

## 2019-10-18 DIAGNOSIS — S90812D Abrasion, left foot, subsequent encounter: Secondary | ICD-10-CM

## 2019-10-18 DIAGNOSIS — S22030A Wedge compression fracture of third thoracic vertebra, initial encounter for closed fracture: Secondary | ICD-10-CM

## 2019-10-18 DIAGNOSIS — T1490XA Injury, unspecified, initial encounter: Secondary | ICD-10-CM

## 2019-10-18 DIAGNOSIS — T796XXS Traumatic ischemia of muscle, sequela: Secondary | ICD-10-CM | POA: Diagnosis not present

## 2019-10-18 DIAGNOSIS — B1920 Unspecified viral hepatitis C without hepatic coma: Secondary | ICD-10-CM | POA: Diagnosis present

## 2019-10-18 DIAGNOSIS — N179 Acute kidney failure, unspecified: Principal | ICD-10-CM

## 2019-10-18 DIAGNOSIS — I130 Hypertensive heart and chronic kidney disease with heart failure and stage 1 through stage 4 chronic kidney disease, or unspecified chronic kidney disease: Secondary | ICD-10-CM | POA: Diagnosis present

## 2019-10-18 DIAGNOSIS — W19XXXD Unspecified fall, subsequent encounter: Secondary | ICD-10-CM | POA: Diagnosis present

## 2019-10-18 DIAGNOSIS — I440 Atrioventricular block, first degree: Secondary | ICD-10-CM | POA: Diagnosis not present

## 2019-10-18 DIAGNOSIS — S42101D Fracture of unspecified part of scapula, right shoulder, subsequent encounter for fracture with routine healing: Secondary | ICD-10-CM

## 2019-10-18 DIAGNOSIS — R5381 Other malaise: Principal | ICD-10-CM | POA: Diagnosis present

## 2019-10-18 DIAGNOSIS — D631 Anemia in chronic kidney disease: Secondary | ICD-10-CM | POA: Diagnosis present

## 2019-10-18 DIAGNOSIS — R739 Hyperglycemia, unspecified: Secondary | ICD-10-CM

## 2019-10-18 DIAGNOSIS — E559 Vitamin D deficiency, unspecified: Secondary | ICD-10-CM | POA: Diagnosis present

## 2019-10-18 DIAGNOSIS — S42102A Fracture of unspecified part of scapula, left shoulder, initial encounter for closed fracture: Secondary | ICD-10-CM

## 2019-10-18 DIAGNOSIS — W19XXXA Unspecified fall, initial encounter: Secondary | ICD-10-CM

## 2019-10-18 DIAGNOSIS — S90811D Abrasion, right foot, subsequent encounter: Secondary | ICD-10-CM

## 2019-10-18 DIAGNOSIS — E119 Type 2 diabetes mellitus without complications: Secondary | ICD-10-CM

## 2019-10-18 DIAGNOSIS — J449 Chronic obstructive pulmonary disease, unspecified: Secondary | ICD-10-CM | POA: Diagnosis present

## 2019-10-18 DIAGNOSIS — N186 End stage renal disease: Secondary | ICD-10-CM | POA: Diagnosis not present

## 2019-10-18 DIAGNOSIS — E11621 Type 2 diabetes mellitus with foot ulcer: Secondary | ICD-10-CM | POA: Diagnosis present

## 2019-10-18 DIAGNOSIS — M25361 Other instability, right knee: Secondary | ICD-10-CM | POA: Diagnosis present

## 2019-10-18 DIAGNOSIS — Z9181 History of falling: Secondary | ICD-10-CM

## 2019-10-18 DIAGNOSIS — E11319 Type 2 diabetes mellitus with unspecified diabetic retinopathy without macular edema: Secondary | ICD-10-CM | POA: Diagnosis present

## 2019-10-18 DIAGNOSIS — S2220XD Unspecified fracture of sternum, subsequent encounter for fracture with routine healing: Secondary | ICD-10-CM

## 2019-10-18 DIAGNOSIS — Z7151 Drug abuse counseling and surveillance of drug abuser: Secondary | ICD-10-CM

## 2019-10-18 DIAGNOSIS — E669 Obesity, unspecified: Secondary | ICD-10-CM | POA: Diagnosis present

## 2019-10-18 DIAGNOSIS — J90 Pleural effusion, not elsewhere classified: Secondary | ICD-10-CM

## 2019-10-18 DIAGNOSIS — I5032 Chronic diastolic (congestive) heart failure: Secondary | ICD-10-CM

## 2019-10-18 DIAGNOSIS — I252 Old myocardial infarction: Secondary | ICD-10-CM

## 2019-10-18 DIAGNOSIS — F419 Anxiety disorder, unspecified: Secondary | ICD-10-CM | POA: Diagnosis present

## 2019-10-18 DIAGNOSIS — L84 Corns and callosities: Secondary | ICD-10-CM | POA: Diagnosis present

## 2019-10-18 DIAGNOSIS — Z87441 Personal history of nephrotic syndrome: Secondary | ICD-10-CM

## 2019-10-18 DIAGNOSIS — Z9081 Acquired absence of spleen: Secondary | ICD-10-CM

## 2019-10-18 LAB — CBC
HCT: 31 % — ABNORMAL LOW (ref 39.0–52.0)
Hemoglobin: 10.1 g/dL — ABNORMAL LOW (ref 13.0–17.0)
MCH: 32.3 pg (ref 26.0–34.0)
MCHC: 32.6 g/dL (ref 30.0–36.0)
MCV: 99 fL (ref 80.0–100.0)
Platelets: 232 10*3/uL (ref 150–400)
RBC: 3.13 MIL/uL — ABNORMAL LOW (ref 4.22–5.81)
RDW: 13.7 % (ref 11.5–15.5)
WBC: 5.2 10*3/uL (ref 4.0–10.5)
nRBC: 0 % (ref 0.0–0.2)

## 2019-10-18 LAB — RENAL FUNCTION PANEL
Albumin: 1.5 g/dL — ABNORMAL LOW (ref 3.5–5.0)
Anion gap: 15 (ref 5–15)
BUN: 68 mg/dL — ABNORMAL HIGH (ref 6–20)
CO2: 21 mmol/L — ABNORMAL LOW (ref 22–32)
Calcium: 7.4 mg/dL — ABNORMAL LOW (ref 8.9–10.3)
Chloride: 98 mmol/L (ref 98–111)
Creatinine, Ser: 4.99 mg/dL — ABNORMAL HIGH (ref 0.61–1.24)
GFR calc Af Amer: 14 mL/min — ABNORMAL LOW (ref >60)
GFR calc non Af Amer: 12 mL/min — ABNORMAL LOW (ref >60)
Glucose, Bld: 214 mg/dL — ABNORMAL HIGH (ref 70–99)
Phosphorus: 5.6 mg/dL — ABNORMAL HIGH (ref 2.5–4.6)
Potassium: 4.9 mmol/L (ref 3.5–5.1)
Sodium: 134 mmol/L — ABNORMAL LOW (ref 135–145)

## 2019-10-18 LAB — GLUCOSE, CAPILLARY
Glucose-Capillary: 219 mg/dL — ABNORMAL HIGH (ref 70–99)
Glucose-Capillary: 263 mg/dL — ABNORMAL HIGH (ref 70–99)
Glucose-Capillary: 165 mg/dL — ABNORMAL HIGH (ref 70–99)
Glucose-Capillary: 217 mg/dL — ABNORMAL HIGH (ref 70–99)

## 2019-10-18 MED ORDER — INSULIN ASPART 100 UNIT/ML ~~LOC~~ SOLN
0.0000 [IU] | Freq: Three times a day (TID) | SUBCUTANEOUS | Status: DC
  Administered 2019-10-18 – 2019-10-19 (×2): 3 [IU] via SUBCUTANEOUS
  Administered 2019-10-19 (×2): 5 [IU] via SUBCUTANEOUS
  Administered 2019-10-20: 3 [IU] via SUBCUTANEOUS
  Administered 2019-10-20 – 2019-10-21 (×3): 5 [IU] via SUBCUTANEOUS
  Administered 2019-10-21: 3 [IU] via SUBCUTANEOUS
  Administered 2019-10-21: 2 [IU] via SUBCUTANEOUS
  Administered 2019-10-22: 3 [IU] via SUBCUTANEOUS
  Administered 2019-10-22: 8 [IU] via SUBCUTANEOUS
  Administered 2019-10-22: 18:00:00 5 [IU] via SUBCUTANEOUS
  Administered 2019-10-23 (×2): 8 [IU] via SUBCUTANEOUS
  Administered 2019-10-24: 12:00:00 3 [IU] via SUBCUTANEOUS
  Administered 2019-10-24 (×2): 2 [IU] via SUBCUTANEOUS
  Administered 2019-10-25: 12:00:00 5 [IU] via SUBCUTANEOUS
  Administered 2019-10-25: 18:00:00 3 [IU] via SUBCUTANEOUS
  Administered 2019-10-26: 12:00:00 15 [IU] via SUBCUTANEOUS
  Administered 2019-10-26: 2 [IU] via SUBCUTANEOUS
  Administered 2019-10-27: 3 [IU] via SUBCUTANEOUS
  Administered 2019-10-27: 12:00:00 8 [IU] via SUBCUTANEOUS
  Administered 2019-10-27: 18:00:00 5 [IU] via SUBCUTANEOUS
  Administered 2019-10-28: 2 [IU] via SUBCUTANEOUS
  Administered 2019-10-28: 12:00:00 5 [IU] via SUBCUTANEOUS
  Administered 2019-10-28 – 2019-10-29 (×3): 3 [IU] via SUBCUTANEOUS
  Administered 2019-10-30: 12:00:00 8 [IU] via SUBCUTANEOUS
  Administered 2019-10-31 (×2): 2 [IU] via SUBCUTANEOUS
  Administered 2019-10-31: 18:00:00 5 [IU] via SUBCUTANEOUS
  Administered 2019-11-01 (×2): 3 [IU] via SUBCUTANEOUS
  Administered 2019-11-02: 18:00:00 8 [IU] via SUBCUTANEOUS
  Administered 2019-11-02: 5 [IU] via SUBCUTANEOUS

## 2019-10-18 MED ORDER — HYDRALAZINE HCL 25 MG PO TABS: 25.0000 mg | ORAL_TABLET | Freq: Three times a day (TID) | ORAL | Status: DC

## 2019-10-18 MED ORDER — ONDANSETRON HCL 4 MG PO TABS
4.0000 mg | ORAL_TABLET | Freq: Four times a day (QID) | ORAL | Status: DC | PRN
  Filled 2019-10-18: qty 1

## 2019-10-18 MED ORDER — FOLIC ACID 1 MG PO TABS
1.0000 mg | ORAL_TABLET | Freq: Every day | ORAL | Status: DC
  Administered 2019-10-19 – 2019-11-03 (×16): 1 mg via ORAL
  Filled 2019-10-18 (×16): qty 1

## 2019-10-18 MED ORDER — HYDRALAZINE HCL 25 MG PO TABS
25.0000 mg | ORAL_TABLET | Freq: Three times a day (TID) | ORAL | Status: DC
  Administered 2019-10-18 – 2019-10-23 (×15): 25 mg via ORAL
  Filled 2019-10-18 (×15): qty 1

## 2019-10-18 MED ORDER — ENSURE ENLIVE PO LIQD: 237.0000 mL | Freq: Two times a day (BID) | ORAL | Status: DC

## 2019-10-18 MED ORDER — THIAMINE HCL 100 MG PO TABS: 100.0000 mg | ORAL_TABLET | Freq: Every day | ORAL | Status: DC

## 2019-10-18 MED ORDER — RESOURCE THICKENUP CLEAR PO POWD: ORAL | Status: DC

## 2019-10-18 MED ORDER — AMLODIPINE BESYLATE 10 MG PO TABS: 10.0000 mg | ORAL_TABLET | Freq: Every day | ORAL | Status: DC

## 2019-10-18 MED ORDER — THIAMINE HCL 100 MG PO TABS
100.0000 mg | ORAL_TABLET | Freq: Every day | ORAL | Status: DC
  Administered 2019-10-19 – 2019-11-03 (×16): 100 mg via ORAL
  Filled 2019-10-18 (×16): qty 1

## 2019-10-18 MED ORDER — HEPARIN SODIUM (PORCINE) 5000 UNIT/ML IJ SOLN: 5000.0000 [IU] | Freq: Three times a day (TID) | INTRAMUSCULAR | Status: DC

## 2019-10-18 MED ORDER — INSULIN GLARGINE 100 UNIT/ML ~~LOC~~ SOLN: 15.0000 [IU] | Freq: Every day | SUBCUTANEOUS | Status: DC

## 2019-10-18 MED ORDER — FLUTICASONE PROPIONATE 50 MCG/ACT NA SUSP: 1.0000 | Freq: Every day | NASAL | Status: DC

## 2019-10-18 MED ORDER — TRAZODONE HCL 50 MG PO TABS
200.0000 mg | ORAL_TABLET | Freq: Every day | ORAL | Status: DC
  Administered 2019-10-18 – 2019-11-02 (×15): 200 mg via ORAL
  Filled 2019-10-18 (×16): qty 4

## 2019-10-18 MED ORDER — OXYCODONE HCL 5 MG PO TABS
5.0000 mg | ORAL_TABLET | ORAL | Status: DC | PRN
  Administered 2019-10-18 – 2019-10-29 (×24): 10 mg via ORAL
  Filled 2019-10-18: qty 2
  Filled 2019-10-18: qty 1
  Filled 2019-10-18 (×25): qty 2

## 2019-10-18 MED ORDER — HYDROCERIN EX CREA
TOPICAL_CREAM | Freq: Two times a day (BID) | CUTANEOUS | Status: AC
  Filled 2019-10-18: qty 113

## 2019-10-18 MED ORDER — FUROSEMIDE 80 MG PO TABS: 80.0000 mg | ORAL_TABLET | Freq: Two times a day (BID) | ORAL | Status: DC

## 2019-10-18 MED ORDER — HEPARIN SODIUM (PORCINE) 5000 UNIT/ML IJ SOLN
5000.0000 [IU] | Freq: Three times a day (TID) | INTRAMUSCULAR | Status: DC
  Administered 2019-10-18 – 2019-11-03 (×48): 5000 [IU] via SUBCUTANEOUS
  Filled 2019-10-18 (×48): qty 1

## 2019-10-18 MED ORDER — FOLIC ACID 1 MG PO TABS: 1.0000 mg | ORAL_TABLET | Freq: Every day | ORAL | Status: DC

## 2019-10-18 MED ORDER — GABAPENTIN 600 MG PO TABS
300.0000 mg | ORAL_TABLET | Freq: Every day | ORAL | Status: DC
  Administered 2019-10-18 – 2019-11-02 (×16): 300 mg via ORAL
  Filled 2019-10-18 (×16): qty 1

## 2019-10-18 MED ORDER — SODIUM BICARBONATE 650 MG PO TABS: 650.0000 mg | ORAL_TABLET | Freq: Two times a day (BID) | ORAL | Status: DC

## 2019-10-18 MED ORDER — ONDANSETRON HCL 4 MG/2ML IJ SOLN: 4.0000 mg | Freq: Four times a day (QID) | INTRAMUSCULAR | Status: DC | PRN

## 2019-10-18 MED ORDER — OLANZAPINE 5 MG PO TABS
5.0000 mg | ORAL_TABLET | Freq: Every day | ORAL | Status: DC
  Administered 2019-10-18 – 2019-11-02 (×16): 5 mg via ORAL
  Filled 2019-10-18 (×16): qty 1

## 2019-10-18 MED ORDER — VENLAFAXINE HCL ER 75 MG PO CP24
225.0000 mg | ORAL_CAPSULE | Freq: Every day | ORAL | Status: DC
  Administered 2019-10-19 – 2019-11-03 (×16): 225 mg via ORAL
  Filled 2019-10-18 (×17): qty 1

## 2019-10-18 MED ORDER — ENSURE ENLIVE PO LIQD
237.0000 mL | Freq: Two times a day (BID) | ORAL | Status: DC
  Administered 2019-10-18 – 2019-10-27 (×18): 237 mL via ORAL

## 2019-10-18 MED ORDER — INSULIN ASPART 100 UNIT/ML ~~LOC~~ SOLN: 0.0000 [IU] | Freq: Three times a day (TID) | SUBCUTANEOUS | Status: DC

## 2019-10-18 MED ORDER — SORBITOL 70 % SOLN: 30.0000 mL | Freq: Every day | Status: DC | PRN

## 2019-10-18 MED ORDER — FUROSEMIDE 40 MG PO TABS
80.0000 mg | ORAL_TABLET | Freq: Two times a day (BID) | ORAL | Status: DC
  Administered 2019-10-18: 18:00:00 80 mg via ORAL
  Filled 2019-10-18 (×2): qty 2

## 2019-10-18 MED ORDER — OXYCODONE HCL 5 MG PO TABS: 5.0000 mg | ORAL_TABLET | ORAL | Status: DC | PRN

## 2019-10-18 MED ORDER — FLUTICASONE PROPIONATE 50 MCG/ACT NA SUSP
1.0000 | Freq: Every day | NASAL | Status: DC
  Administered 2019-10-19 – 2019-11-03 (×13): 1 via NASAL
  Filled 2019-10-18: qty 16

## 2019-10-18 MED ORDER — SODIUM BICARBONATE 650 MG PO TABS
650.0000 mg | ORAL_TABLET | Freq: Two times a day (BID) | ORAL | Status: DC
  Administered 2019-10-18 – 2019-10-21 (×6): 650 mg via ORAL
  Filled 2019-10-18 (×7): qty 1

## 2019-10-18 MED ORDER — AMLODIPINE BESYLATE 10 MG PO TABS
10.0000 mg | ORAL_TABLET | Freq: Every day | ORAL | Status: DC
  Administered 2019-10-19 – 2019-11-03 (×16): 10 mg via ORAL
  Filled 2019-10-18 (×16): qty 1

## 2019-10-18 MED ORDER — DOCUSATE SODIUM 100 MG PO CAPS
100.0000 mg | ORAL_CAPSULE | Freq: Two times a day (BID) | ORAL | Status: DC
  Administered 2019-10-18 – 2019-11-03 (×32): 100 mg via ORAL
  Filled 2019-10-18 (×32): qty 1

## 2019-10-18 MED ORDER — INSULIN GLARGINE 100 UNIT/ML ~~LOC~~ SOLN
15.0000 [IU] | Freq: Every day | SUBCUTANEOUS | Status: DC
  Administered 2019-10-19 – 2019-10-23 (×5): 15 [IU] via SUBCUTANEOUS
  Filled 2019-10-18 (×6): qty 0.15

## 2019-10-18 NOTE — Care Management Important Message (Signed)
Important Message  Patient Details  Name: [name] MRN: [id number] Date of Birth: Dec 06, 1960   Medicare Important Message Given:  Yes     [name] 10/18/2019, 11:45 AM

## 2019-10-18 NOTE — Discharge Instructions (Signed)

## 2019-10-18 NOTE — Progress Notes (Signed)
Patient arrived on unit at 1400. Patient is stable and in no pain. All needs have been met at this time.
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
## 2019-10-18 NOTE — Progress Notes (Signed)
Inpatient Rehabilitation Admissions Coordinator  I have CIR bed available today and pt is in agreement to admit. I spoke with Dr. Ree Kida and will make the arrangements to admit today. RN CM, Ree Edman, SW , made aware.  Danne Baxter, RN, MSN Rehab Admissions Coordinator [phone] 10/18/2019 11:00 AM

## 2019-10-18 NOTE — Progress Notes (Signed)
Physical Therapy Treatment Patient Details Name: Barry Lewis MRN: [id number] DOB: [birth year] Today's Date: 10/18/2019    History of Present Illness Pt is a 59 y/o male admitted secondary to sustaining a fall and being found down for several hours. He was found to have multiple injuries, including acute rib fractures, scapular fracture, T3 compression fracture and transverse process fractures at T1-T2.  He has been evaluated by neurosurgery, and orthopedic surgery and it appears that conservative treatment has been recommended for these fractures. PMH including but not limited to CVA; schizoaffective/bipolar disorder; polysubstance abuse; HLD; DM; dementia; COPD; chronic diastolic CHF.    PT Comments    Pt continuing to progress towards goals. Complete vestibular evaluation for BPPV due to report of dizziness with change of positions however unable to re-produce the dizziness and no nystagmus present. Suspect due to pain pt may be holding breath causing pt to become light headed. Pt remains to have impaired balance, requiring assist for ADLs and mobility. Cont to recommend CIR upon d/c to achieve safe mod I level of function.    Follow Up Recommendations  CIR     Equipment Recommendations  Rolling walker with 5" wheels    Recommendations for Other Services       Precautions / Restrictions Precautions Precautions: Fall;Back Precaution Booklet Issued: No Precaution Comments: Reviewed log roll for comfort due to fracture Restrictions Weight Bearing Restrictions: No LUE Weight Bearing: Weight bearing as tolerated RLE Weight Bearing: Weight bearing as tolerated    Mobility  Bed Mobility Overal bed mobility: Needs Assistance Bed Mobility: Rolling;Sidelying to Sit;Sit to Sidelying Rolling: Mod assist Sidelying to sit: Mod assist     Sit to sidelying: Mod assist General bed mobility comments: pt with increased pain today requiring more assist due to onset of pain  Transfers Overall  transfer level: Needs assistance Equipment used: Rolling walker (2 wheeled) Transfers: Sit to/from Stand Sit to Stand: Mod assist         General transfer comment: assist to power up and steady during transition of hand from bed to RW  Ambulation/Gait Ambulation/Gait assistance: Min assist;+2 safety/equipment Gait Distance (Feet): 150 Feet Assistive device: Rolling walker (2 wheeled) Gait Pattern/deviations: Step-through pattern;Decreased stride length;Trunk flexed Gait velocity: pt with loose grip on RW due to neuropathy, pt with decreased step height and more difficutly with L than right, pt with decreased cadance Gait velocity interpretation: <1.8 ft/sec, indicate of risk for recurrent falls General Gait Details: Unsteady gait with ant/posterior sway requiring Min A to steady; bil knee instability noted but no buckling. Sp02 dropped to 85% on RA, recovered quickly with seated rest break.   Stairs             Wheelchair Mobility    Modified Rankin (Stroke Patients Only)       Balance Overall balance assessment: Needs assistance Sitting-balance support: Feet supported;No upper extremity supported Sitting balance-Leahy Scale: Fair Sitting balance - Comments: attempted to don socks with OT however unable and with significant posterior lean   Standing balance support: During functional activity Standing balance-Leahy Scale: Poor Standing balance comment: Requires UE support in standing.                            Cognition Arousal/Alertness: Awake/alert Behavior During Therapy: WFL for tasks assessed/performed Overall Cognitive Status: Within Functional Limits for tasks assessed  Exercises      General Comments General comments (skin integrity, edema, etc.): pt with c/o dizzines with transition from supine --> sit --> stand. Completed vestibular evaluation for horizontal and posterior canal BPPV,  unable to re-produce dizziness or symptoms with dix hall pike and horizontal rolling, suspect due to pain pt is holding breath when getting up which is making him light headed      Pertinent Vitals/Pain Pain Assessment: Faces Faces Pain Scale: Hurts even more Pain Location: back, chest, ribs with rolling Pain Descriptors / Indicators: Discomfort Pain Intervention(s): Monitored during session    Home Living   Living Arrangements: (duplex rental ) Available Help at Discharge: (no assist at d/c) Type of Home: (duplex apartment/house)              Prior Function            PT Goals (current goals can now be found in the care plan section) Progress towards PT goals: Progressing toward goals    Frequency    Min 3X/week      PT Plan Current plan remains appropriate    Co-evaluation              AM-PAC PT "6 Clicks" Mobility   Outcome Measure  Help needed turning from your back to your side while in a flat bed without using bedrails?: A Little Help needed moving from lying on your back to sitting on the side of a flat bed without using bedrails?: A Little Help needed moving to and from a bed to a chair (including a wheelchair)?: A Little Help needed standing up from a chair using your arms (e.g., wheelchair or bedside chair)?: A Little Help needed to walk in hospital room?: A Little Help needed climbing 3-5 steps with a railing? : A Lot 6 Click Score: 17    End of Session Equipment Utilized During Treatment: Gait belt Activity Tolerance: Patient tolerated treatment well Patient left: in bed;with call bell/phone within reach;with bed alarm set Nurse Communication: Mobility status PT Visit Diagnosis: Other abnormalities of gait and mobility (R26.89)     Time: [id number] PT Time Calculation (min) (ACUTE ONLY): 31 min  Charges:  $Gait Training: 8-22 mins $Canalith Rep Proc: 8-22 mins                     Kittie Plater, PT, DPT Acute Rehabilitation  Services Pager #: [phone] Office #: [phone]    Berline Lopes 10/18/2019, 1:28 PM

## 2019-10-18 NOTE — TOC Transition Note (Addendum)
Transition of Care [facility]) - CM/SW Discharge Note   Patient Details  Name: Barry Lewis MRN: [id number] Date of Birth: 1961/05/05  Transition of Care [facility]) CM/SW Contact:  Zenon Mayo, RN Phone Number: 10/18/2019, 11:24 AM   Clinical Narrative:    Patient for dc to CIR today.  Patient stated he has no transportation to get his meds,  NCM contacted [facility] on 2/2 and they do deliver.  NCM gave pharmacy patient infromation.  Please have meds to be followed up at [phone], fax is [phone]. There is also another pharmacy that [place] called [facility] [phone].   Final next level of care: IP Rehab Facility Barriers to Discharge: No Barriers Identified   Patient Goals and CMS Choice Patient states their goals for this hospitalization and ongoing recovery are:: to go back to my house CMS Medicare.gov Compare Post Acute Care list provided to:: Patient Choice offered to / list presented to : Patient  Discharge Placement                       Discharge Plan and Services   Discharge Planning Services: CM Consult Post Acute Care Choice: Home Health          DME Arranged: (NA)         HH Arranged: NA HH Agency: [place] Date [place]: 10/16/19 Time HH Agency Contacted: 35 Representative spoke with at [place]: cheryl  Social Determinants of Health ([place]) Interventions     Readmission Risk Interventions No flowsheet data found.
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
## 2019-10-18 NOTE — Progress Notes (Signed)
[facility] Progress Note    Assessment/ Plan:   1. AoCKD IV - in pt w/ fall at home and traumatic rib/ scapula/ t-spine fractures. Known CKD VI , baseline creat 2.2- 2.7, was f/b nephrologist in [place] before moving to [place] in spring last year.  AKI poss cardiorenal+ ACEi. ACEi off now.  Robust UOP, Cr stable at 4.9, transitioned to PO Lasix. I'll send him for vein mapping in preparation for AVF.  Will need close nephrology followup- lives in [place] 2. Hypoalbuminemia - alb 1.5, part of this may be from neph syndrome, don't have any details but is on his list of medical problems and UA shows > 300, have ordered UP/C 3. AMS - doing well today 4. Psych - bipolar/ schizoaffective/ depression, on mult psych meds 5. 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 HTN - avoid acei, on norvasc, will add hydralazine 25 tid 7. Dispo: going to CIR   Subjective:    Cr is stabilized at 4.9 on PO diuretics.  Going to CIR today.     Objective:   BP (!) 173/91   Pulse 84   Temp 98.7 F (37.1 C) (Oral)   Resp 20   Ht 5\' 6"  (1.676 m)   Wt 96 kg   SpO2 94%   BMI 34.16 kg/m   Intake/Output Summary (Last 24 hours) at 10/18/2019 1054 Last data filed at 10/18/2019 1025 Gross per 24 hour  Intake 1800 ml  Output 2450 ml  Net -650 ml   Weight change: 0 kg  Physical Exam: Gen: NAD, lying in bed flat CVS: RRR no m/r/g Resp: clear, no c/w/r Abd: soft, nontender Ext: no LE edema  Imaging: No results found.  Labs: BMET Recent Labs  Lab 10/13/19 1613 10/14/19 0554 10/14/19 1542 10/15/19 0649 10/16/19 0215 10/17/19 0355 10/18/19 0520  NA 132* 129* 135 136 132* 136 134*  K 4.9 4.5 4.3 4.4 4.8 4.7 4.9  CL 105 104 107 108 102 102 98  CO2 16* 15* 17* 18* 18* 21* 21*  GLUCOSE 229* 349* 100* 85 177* 144* 214*  BUN 59* 53* 52* 50* 56* 61* 68*  CREATININE 4.77* 4.46* 4.45* 4.64* 4.93* 4.92* 4.99*  CALCIUM 7.1* 6.7* 7.1* 7.3* 7.1* 7.2* 7.4*  PHOS  --   --   --   --  6.3* 6.2* 5.6*   CBC Recent Labs  Lab  10/13/19 0237 10/14/19 0554 10/17/19 0759 10/18/19 0520  WBC 8.7 8.0 5.6 5.2  HGB 12.2* 10.3* 11.4* 10.1*  HCT 36.1* 31.4* 35.0* 31.0*  MCV 96.8 99.1 98.9 99.0  PLT 340 296 235 232    Medications:    . amLODipine  10 mg Oral Daily  . Chlorhexidine Gluconate Cloth  6 each Topical Daily  . docusate sodium  100 mg Oral BID  . feeding supplement (ENSURE ENLIVE)  237 mL Oral BID BM  . fluticasone  1 spray Each Nare Daily  . folic acid  1 mg Oral Daily  . furosemide  80 mg Oral BID  . gabapentin  300 mg Oral QHS  . heparin  5,000 Units Subcutaneous Q8H  . hydrALAZINE  25 mg Oral Q8H  . hydrocerin   Topical BID  . insulin aspart  0-15 Units Subcutaneous TID WC  . insulin aspart  0-5 Units Subcutaneous QHS  . insulin glargine  15 Units Subcutaneous Daily  . OLANZapine  5 mg Oral QHS  . sodium bicarbonate  650 mg Oral BID  . thiamine  100 mg  Oral Daily  . traZODone  200 mg Oral QHS  . venlafaxine XR  225 mg Oral Q breakfast      Madelon Lips, MD 10/18/2019, 10:54 AM

## 2019-10-18 NOTE — Discharge Summary (Signed)
Physician Discharge Summary  Barry Lewis LZJ:[id number] DOB: August 12, 1961 DOA: 10/13/2019  PCP: Patient, No Pcp Per  Admit date: 10/13/2019 Discharge date: 10/18/2019  Time spent: 45 minutes  Recommendations for Outpatient Follow-up:  Patient will be discharged to inpatient rehabilitation. Continue physical and occupational therapy.  Patient will need to follow up with primary care provider within one week of discharge. Follow up with Dr. Sharol Given, orthopedic surgery, in one week.  Will need to follow up with nephrology. Patient should continue medications as prescribed.  Patient should follow a heart healthy/carb modified diet.   Discharge Diagnoses:  Acute kidney injury on chronic kidney disease, stage IV Mild rhabdomyolysis Metabolic acidosis Acute metabolic encephalopathy Trauma with multiple orthopedic injuries Right second digit of foot fracture; multiple foot ulcers/right plantar foot aspect callus/necrosis Left pleural effusion Destructive process involving sternum Combined systolic and diastolic heart failure Hypertension Dementia, bipolar Diabetes mellitus, type II Obesity  Discharge Condition: Stable  Diet recommendation: heart healthy/carb modified  Filed Weights   10/16/19 0418 10/17/19 0346 10/18/19 0010  Weight: 102.4 kg 96 kg 96 kg    History of present illness:  On 10/13/2019 by Dr. Karmen Bongo [name] is a 59 y.o. male with medical history significant of CVA; schizoaffective/bipolar disorder; polysubstance abuse; HLD; DM; dementia; COPD; chronic diastolic CHF; and chronic back pain presenting with a fall.  He reports that he fell over 24 hours before being found.  He was in the bathroom and slipped.  He was unable to get up.   He mostly has back and neck pain at this time.  He feels very thirsty.  Hospital Course:  Acute kidney injury on chronic kidney disease, stage IV -Baseline creatinine approximately 2.2-2.7; was 5.06 on admission -Patient presented with  urinary retention, after Foley catheter was placed, 700 cc of urine eliminated -Patient is initially placed on IV fluids which were discontinued -Nephrology consulted and appreciated, placed patient on IV Lasix however now on oral -Creatinine currently 4.9  Mild rhabdomyolysis -Secondary to fall -CK level on admission 649- now down to 177 -As above, patient initially received IV fluids  Metabolic acidosis -In the setting of renal failure -Was placed on sodium bicarb drip however this is discontinued and currently on sodium bicarb tablets  Acute metabolic encephalopathy -Resolved, likely secondary to the above -Patient was noted to be lethargic the morning of 10/14/2019 -Patient is alert and oriented x3  Trauma with multiple orthopedic injuries -Including bilateral rib fractures, right scapular fracture, age-indeterminate fractures involving T3, T1 and T2 right transverse processes -Orthopedics consulted and recommended weightbearing as tolerated on his left upper extremity for scapular fracture -Neurosurgery was consulted and recommended removing c-collar.  No new recommendations per Dr. Christella Noa -PT recommended CIR -inpatient rehab consulted   Right second digit of foot fracture; multiple foot ulcers/right plantar foot aspect callus/necrosis -X-ray showed multiple ulcers, no osteomyelitis -Orthopedic surgery consulted and appreciated, Dr. Ancil Boozer to follow-up in the office in 1 week for debridement.  No surgery needed at this time.  Left pleural effusion -Patient currently on room air  Destructive process involving sternum -Possibly old fracture versus infection versus mass -ESR is 95 -Blood culture showed no growth to date -Unable to obtain contrasted study due to renal failure -Previous hospitalist discussed with radiology, appears to be chronic smoldering process, unlikely any other imaging would provide more information.  Recommend follow-up with orthopedics or  CVTS -CVTS recommended no intervention at this time, may reimage as an outpatient  Combined systolic and diastolic heart failure -  Last echocardiogram August 2020 at [facility] showed an EF of 45 to 58%, diastolic dysfunction -Patient has had good urine output, -6L -Continue Lasix  Hypertension -Lisinopril held due to renal failure -Continue amlodipine, hydralazine  Dementia, bipolar -Continue Zyprexa, trazodone, Effexor and gabapentin  Diabetes mellitus, type II -Hemoglobin A1c 8.5 on 10/13/2019 -Continue Lantus, insulin sliding scale -Tyler Aas held  Obesity -BMI 34 -Follow-up with PCP on discharge discussed lifestyle modifications  Procedures: None  Consultations: Nephrology Orthopedic surgery Orthopedic surgery, Dr. Sharol Given Inpatient rehab Neurosurgery CVTS  Discharge Exam:  10/18/19 0724  BP: 133/71  Pulse: 80  Resp: 20  Temp: 98.7 F (37.1 C)  SpO2: 94%     General: Well developed, well nourished, NAD, appears stated age  HEENT: NCAT, mucous membranes moist.  Cardiovascular: S1 S2 auscultated, RRR  Respiratory: Clear to auscultation bilaterally   Abdomen: Soft, nontender, nondistended, + bowel sounds  Extremities: warm dry without cyanosis clubbing or edema  Neuro: AAOx3, nonfocal  Psych: Normal affect and demeanor  Discharge Instructions Discharge Instructions    Ambulatory referral to Physical Medicine Rehab   Complete by: As directed    Post concussive management. Will need to be seen within 7-10 days of discharge.   Increase activity slowly   Complete by: As directed      Allergies as of 10/18/2019      Reactions   Lithium Anaphylaxis   Ace Inhibitors Other (See Comments)   Persistent hyperkalemia; per nephrology patient should not be prescribed.   Angiotensin Receptor Blockers Other (See Comments)   Persistent hyperkalemia; per nephrology patient should not be prescribed.   Ibuprofen Other (See Comments)   Due to kidney issues   Invega  [paliperidone] Other (See Comments)   Dysarthria and drooling   Prozac [fluoxetine Hcl]    "makes me want to kill people" per ER report      Medication List    STOP taking these medications   carbamazepine 300 MG 12 hr capsule Commonly known as: CARBATROL   lisinopril 10 MG tablet Commonly known as: ZESTRIL   Tresiba FlexTouch 200 UNIT/ML Sopn Generic drug: Insulin Degludec     TAKE these medications   amLODipine 10 MG tablet Commonly known as: NORVASC Take 1 tablet (10 mg total) by mouth daily.   feeding supplement (ENSURE ENLIVE) Liqd Take 237 mLs by mouth 2 (two) times daily between meals.   fluticasone 50 MCG/ACT nasal spray Commonly known as: FLONASE Place 1 spray into both nostrils daily. Start taking on: October 18, 8500   folic acid 1 MG tablet Commonly known as: FOLVITE Take 1 tablet (1 mg total) by mouth daily. Start taking on: October 19, 2019   furosemide 80 MG tablet Commonly known as: LASIX Take 1 tablet (80 mg total) by mouth 2 (two) times daily.   gabapentin 600 MG tablet Commonly known as: NEURONTIN Take 600 mg by mouth at bedtime. What changed: Another medication with the same name was removed. Continue taking this medication, and follow the directions you see here.   hydrALAZINE 25 MG tablet Commonly known as: APRESOLINE Take 1 tablet (25 mg total) by mouth every 8 (eight) hours.   insulin aspart 100 UNIT/ML injection Commonly known as: novoLOG Inject 0-15 Units into the skin 3 (three) times daily with meals.   insulin glargine 100 UNIT/ML injection Commonly known as: LANTUS Inject 0.15 mLs (15 Units total) into the skin daily. Start taking on: October 19, 2019   OLANZapine 5 MG tablet Commonly known as: ZYPREXA  Take 5 mg by mouth at bedtime.   oxyCODONE 5 MG immediate release tablet Commonly known as: Oxy IR/ROXICODONE Take 1-2 tablets (5-10 mg total) by mouth every 4 (four) hours as needed for moderate pain or severe pain (37m for  moderate pain, 132mfor severe pain).   Resource ThickenUp Clear Powd Use as needed   sodium bicarbonate 650 MG tablet Take 1 tablet (650 mg total) by mouth 2 (two) times daily.   thiamine 100 MG tablet Take 1 tablet (100 mg total) by mouth daily. Start taking on: October 19, 2019   traZODone 100 MG tablet Commonly known as: DESYREL Take 200 mg by mouth daily.   venlafaxine XR 75 MG 24 hr capsule Commonly known as: EFFEXOR-XR Take 225 mg by mouth daily.      Allergies  Allergen Reactions  . Lithium Anaphylaxis  . Ace Inhibitors Other (See Comments)    Persistent hyperkalemia; per nephrology patient should not be prescribed.  . Angiotensin Receptor Blockers Other (See Comments)    Persistent hyperkalemia; per nephrology patient should not be prescribed.  . Ibuprofen Other (See Comments)    Due to kidney issues   . Invega [Paliperidone] Other (See Comments)    Dysarthria and drooling  . Prozac [Fluoxetine Hcl]     "makes me want to kill people" per ER report   Follow-up Information    Haddix, KeThomasene LotMD. Schedule an appointment as soon as possible for a visit.   Specialty: Orthopedic Surgery Why: 2 weeks Contact information: [phone]        CaAshok PallMD. Schedule an appointment as soon as possible for a visit.   Specialty: Neurosurgery Contact information: [phone]      CCEckhart MinesSHindsboroSchedule an appointment as soon as possible for a visit.   Why: as needed Contact information: [id number] [address]   Why: For home health services. they will call you in a few days to set up your first appointment Contact information: [phone]      DuNewt MinionMD Follow up in 1 week(s).     Specialty: Orthopedic Surgery Contact information: [address] [phone]          The results of significant diagnostics from this hospitalization (including imaging, microbiology, ancillary and laboratory) are listed below for reference.    Significant Diagnostic Studies: CT ABDOMEN PELVIS WO CONTRAST  Result Date: 10/13/2019 CLINICAL DATA:  Initial evaluation for acute trauma, fall. EXAM: CT CHEST, ABDOMEN AND PELVIS WITHOUT CONTRAST TECHNIQUE: Multidetector CT imaging of the chest, abdomen and pelvis was performed following the standard protocol without IV contrast. COMPARISON:  Comparison made with prior CT from 04/26/2019. FINDINGS: CT CHEST FINDINGS Cardiovascular: Intrathoracic aorta normal in caliber without aneurysm. Scattered atheromatous change noted about the arch and origin of the great vessels as well as within the descending intrathoracic aorta. Cardiomegaly. Scattered 3 vessel coronary artery calcifications. No pericardial effusion. Mediastinum/Nodes: Coarse calcification noted within the left lobe of thyroid, of doubtful significance. Visualized thyroid otherwise unremarkable. No pathologically enlarged mediastinal or hilar lymph nodes identified on this noncontrast examination. No axillary adenopathy. Esophagus within normal limits. No mediastinal hematoma. Lungs/Pleura: Tracheobronchial tree intact and patent. Lungs hypoinflated  with elevation of the right hemidiaphragm. Moderate to large right greater than left pleural effusions. These measure simple fluid density with no evidence for hemothorax. Superimposed ground-glass opacity in interlobular septal thickening elsewhere within the aerated portions of the lungs, suggesting a degree of pulmonary interstitial edema. Superimposed scattered linear and parenchymal opacity within the dependent aspects of both lungs, likely atelectasis. Infection difficult to exclude. No pneumothorax. Musculoskeletal:  Mild diffuse anasarca noted within the external soft tissues. There is an acute fracture of the left scapular wing (series 7, image 49). There is an acute fracture of the left anterolateral third rib, lateral left fifth and sixth ribs. Additional acute appearing fracture of the left posterolateral eleventh rib (series 7, image 164). On the right, there are acute nondisplaced fractures of the right lateral 6 and likely seventh ribs, as well as the posterior ninth and tenth ribs. Multiple underlying chronic bilateral rib fractures noted. There is new destructive process involving the superior aspect of the sternum (series 6, image 70). Finding suspected to reflect a previous/remote fracture with osseous remodeling. No significant surrounding soft tissue stranding identified. Age indeterminate fractures involving T3 and the right transverse processes of T1 and T2 noted, better evaluated on concomitant CT of the thoracic spine. CT ABDOMEN PELVIS FINDINGS Hepatobiliary: Limited noncontrast evaluation of the liver is grossly unremarkable. Multiple calcified stones noted within the gallbladder lumen. No evidence for acute cholecystitis. No biliary dilatation. Pancreas: Pancreas demonstrates no acute abnormality. Few scattered calcific densities at the pancreatic head and body favored to be vascular in nature. Spleen: Spleen grossly intact and without acute abnormality on this noncontrast examination. Dystrophic calcifications in contour deformity along the lateral margin again noted, stable. Adrenals/Urinary Tract: Adrenal glands within normal limits. Kidneys equal in size. No nephrolithiasis. No hydronephrosis. Mild scattered perinephric stranding noted about the kidneys bilaterally, nonspecific, but similar to previous. No hydroureter. Bladder moderately distended without acute abnormality. Stomach/Bowel: Stomach within normal limits. No evidence for bowel obstruction. Negative appendix. Large volume retained stool seen  within the colon and rectal vault, suggesting constipation. No acute inflammatory changes seen about the bowels. Vascular/Lymphatic: Vascular patency not evaluated given lack of IV contrast. Intra-abdominal aorta of normal caliber. Mild atherosclerotic change. No adenopathy. Reproductive: Prostate and seminal vesicles within normal limits. Other: No free air or fluid. No mesenteric or retroperitoneal hematoma. Musculoskeletal: No acute fracture within the pelvis. No discrete lytic or blastic osseous lesions. Diffuse anasarca seen within the external soft tissues. Asymmetric soft tissue stranding within the subcutaneous fat of the lateral left flank could reflect mild contusion (series 7, image 188). IMPRESSION: CT CHEST 1. Acute bilateral rib fractures as above. No evidence for hemothorax or pneumothorax. 2. Acute fracture of the left scapular wing. 3. Age indeterminate fractures involving the T3 as well as the T1 and T2 right transverse processes, better evaluated on concomitant CT of the thoracic spine. 4. Moderate to large right greater than left pleural effusions with de pendant opacity, likely atelectasis. Superimposed infection difficult to exclude, and could be considered in the correct clinical setting. Superimposed ground-glass opacity and interlobular septal thickening suggest a degree of pulmonary interstitial edema. 5. Destructive process involving the sternum, new from previous. Finding favored to reflect a remote fracture with associated osseous remodeling. Possible infection or even a destructive lytic mass not entirely excluded. Correlation with physical exam recommended. Additionally, further assessment with dedicated MRI could be performed as warranted. CT ABDOMEN AND PELVIS 1. Asymmetric soft tissue stranding within the subcutaneous fat of the lateral  left flank, which may reflect mild contusion. 2. No other acute traumatic injury within the abdomen and pelvis. 3. Large volume retained stool  within the colon, suggesting constipation. 4. Cholelithiasis. 5. Diffuse anasarca, likely related overall volume status. Electronically Signed   By: Jeannine Boga M.D.   On: 10/13/2019 05:20   DG Chest 2 View  Result Date: 10/16/2019 CLINICAL DATA:  Pleural effusion EXAM: CHEST - 2 VIEW COMPARISON:  10/13/2019 FINDINGS: Bilateral pleural effusions layering dependently in the posteroinferior pleural space. Mild compressive atelectasis at the right lung base associated with that. Chronic pleural and parenchymal scarring on the left related old chest trauma. Associated atelectasis related to the effusion. Effusions appear larger than were seen on 10/13/2019. IMPRESSION: Bilateral effusions layering dependently in the posteroinferior pleural spaces. Associated atelectasis of the lower lungs. Chronic pleural and parenchymal scarring on the left related old chest trauma. Effusions may be slightly larger than were seen 3 days ago. Electronically Signed   By: Nelson Chimes M.D.   On: 10/16/2019 09:13   CT HEAD WO CONTRAST  Result Date: 10/13/2019 CLINICAL DATA:  Initial evaluation for acute trauma, fall. EXAM: CT HEAD WITHOUT CONTRAST CT CERVICAL SPINE WITHOUT CONTRAST TECHNIQUE: Multidetector CT imaging of the head and cervical spine was performed following the standard protocol without intravenous contrast. Multiplanar CT image reconstructions of the cervical spine were also generated. COMPARISON:  None available. FINDINGS: CT HEAD FINDINGS Brain: Generalized age-related cerebral atrophy with chronic microvascular ischemic disease. No acute intracranial hemorrhage. No acute large vessel territory infarct. No mass lesion, midline shift or mass effect. No hydrocephalus. No extra-axial fluid collection. Vascular: No hyperdense vessel. Scattered vascular calcifications noted within the carotid siphons. Skull: Scalp soft tissues demonstrate no acute finding. Calvarium intact. Sinuses/Orbits: Globes and orbital  soft tissues within normal limits. Mild mucosal thickening noted within the ethmoidal air cells and maxillary sinuses. Few scattered dental caries noted. No mastoid effusion. Other: None. CT CERVICAL SPINE FINDINGS Alignment: Vertebral bodies normally aligned with preservation of the normal cervical lordosis. No listhesis. Skull base and vertebrae: Skull base intact. Normal C1-2 articulations are preserved in the dens is intact. There is minimal height loss at the superior endplate of C7, age indeterminate (series 6, image 52). Vertebral body height otherwise maintained. Additional age-indeterminate fractures of the right transverse processes of T1 and T2. Remote fracture of the T1 spinous process noted. No other acute fracture identified. Soft tissues and spinal canal: Paraspinous soft tissues demonstrate no acute finding. No significant prevertebral edema. Vascular calcifications about the carotid bifurcations. Disc levels: Mild multilevel degenerative disc bulging, most notable at C3-4. Left-sided facet arthrosis noted at C2-3. Upper chest: Bilateral pleural effusions noted within the visualized lung apices, better evaluated on concomitant CT of the chest. Other: None. IMPRESSION: CT BRAIN: 1. No acute intracranial abnormality. 2. Generalized age-related cerebral atrophy with mild chronic small vessel ischemic disease. CT CERVICAL SPINE: 1. Mild height loss at the superior endplate of C7, age indeterminate. Correlation with physical exam for possible pain at this location recommended. Additionally, finding could be further assessed with dedicated MRI as clinically warranted. 2. Nondisplaced fractures of the right transverse processes of T1 and T2, also somewhat age indeterminate. 3. Remote fracture of the T1 spinous process, stable from previous. Electronically Signed   By: Jeannine Boga M.D.   On: 10/13/2019 04:33   CT Chest Wo Contrast  Result Date: 10/13/2019 CLINICAL DATA:  Initial evaluation for  acute trauma, fall. EXAM: CT CHEST, ABDOMEN AND PELVIS WITHOUT  CONTRAST TECHNIQUE: Multidetector CT imaging of the chest, abdomen and pelvis was performed following the standard protocol without IV contrast. COMPARISON:  Comparison made with prior CT from 04/26/2019. FINDINGS: CT CHEST FINDINGS Cardiovascular: Intrathoracic aorta normal in caliber without aneurysm. Scattered atheromatous change noted about the arch and origin of the great vessels as well as within the descending intrathoracic aorta. Cardiomegaly. Scattered 3 vessel coronary artery calcifications. No pericardial effusion. Mediastinum/Nodes: Coarse calcification noted within the left lobe of thyroid, of doubtful significance. Visualized thyroid otherwise unremarkable. No pathologically enlarged mediastinal or hilar lymph nodes identified on this noncontrast examination. No axillary adenopathy. Esophagus within normal limits. No mediastinal hematoma. Lungs/Pleura: Tracheobronchial tree intact and patent. Lungs hypoinflated with elevation of the right hemidiaphragm. Moderate to large right greater than left pleural effusions. These measure simple fluid density with no evidence for hemothorax. Superimposed ground-glass opacity in interlobular septal thickening elsewhere within the aerated portions of the lungs, suggesting a degree of pulmonary interstitial edema. Superimposed scattered linear and parenchymal opacity within the dependent aspects of both lungs, likely atelectasis. Infection difficult to exclude. No pneumothorax. Musculoskeletal: Mild diffuse anasarca noted within the external soft tissues. There is an acute fracture of the left scapular wing (series 7, image 49). There is an acute fracture of the left anterolateral third rib, lateral left fifth and sixth ribs. Additional acute appearing fracture of the left posterolateral eleventh rib (series 7, image 164). On the right, there are acute nondisplaced fractures of the right lateral 6 and  likely seventh ribs, as well as the posterior ninth and tenth ribs. Multiple underlying chronic bilateral rib fractures noted. There is new destructive process involving the superior aspect of the sternum (series 6, image 70). Finding suspected to reflect a previous/remote fracture with osseous remodeling. No significant surrounding soft tissue stranding identified. Age indeterminate fractures involving T3 and the right transverse processes of T1 and T2 noted, better evaluated on concomitant CT of the thoracic spine. CT ABDOMEN PELVIS FINDINGS Hepatobiliary: Limited noncontrast evaluation of the liver is grossly unremarkable. Multiple calcified stones noted within the gallbladder lumen. No evidence for acute cholecystitis. No biliary dilatation. Pancreas: Pancreas demonstrates no acute abnormality. Few scattered calcific densities at the pancreatic head and body favored to be vascular in nature. Spleen: Spleen grossly intact and without acute abnormality on this noncontrast examination. Dystrophic calcifications in contour deformity along the lateral margin again noted, stable. Adrenals/Urinary Tract: Adrenal glands within normal limits. Kidneys equal in size. No nephrolithiasis. No hydronephrosis. Mild scattered perinephric stranding noted about the kidneys bilaterally, nonspecific, but similar to previous. No hydroureter. Bladder moderately distended without acute abnormality. Stomach/Bowel: Stomach within normal limits. No evidence for bowel obstruction. Negative appendix. Large volume retained stool seen within the colon and rectal vault, suggesting constipation. No acute inflammatory changes seen about the bowels. Vascular/Lymphatic: Vascular patency not evaluated given lack of IV contrast. Intra-abdominal aorta of normal caliber. Mild atherosclerotic change. No adenopathy. Reproductive: Prostate and seminal vesicles within normal limits. Other: No free air or fluid. No mesenteric or retroperitoneal hematoma.  Musculoskeletal: No acute fracture within the pelvis. No discrete lytic or blastic osseous lesions. Diffuse anasarca seen within the external soft tissues. Asymmetric soft tissue stranding within the subcutaneous fat of the lateral left flank could reflect mild contusion (series 7, image 188). IMPRESSION: CT CHEST 1. Acute bilateral rib fractures as above. No evidence for hemothorax or pneumothorax. 2. Acute fracture of the left scapular wing. 3. Age indeterminate fractures involving the T3 as well as the T1 and  T2 right transverse processes, better evaluated on concomitant CT of the thoracic spine. 4. Moderate to large right greater than left pleural effusions with de pendant opacity, likely atelectasis. Superimposed infection difficult to exclude, and could be considered in the correct clinical setting. Superimposed ground-glass opacity and interlobular septal thickening suggest a degree of pulmonary interstitial edema. 5. Destructive process involving the sternum, new from previous. Finding favored to reflect a remote fracture with associated osseous remodeling. Possible infection or even a

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
## 2019-10-19 ENCOUNTER — Inpatient Hospital Stay (HOSPITAL_COMMUNITY): Payer: Medicare Other

## 2019-10-19 ENCOUNTER — Inpatient Hospital Stay (HOSPITAL_COMMUNITY): Payer: Medicare Other | Admitting: Speech Pathology

## 2019-10-19 DIAGNOSIS — T796XXS Traumatic ischemia of muscle, sequela: Secondary | ICD-10-CM

## 2019-10-19 DIAGNOSIS — N184 Chronic kidney disease, stage 4 (severe): Secondary | ICD-10-CM

## 2019-10-19 DIAGNOSIS — T07XXXA Unspecified multiple injuries, initial encounter: Secondary | ICD-10-CM

## 2019-10-19 DIAGNOSIS — E1165 Type 2 diabetes mellitus with hyperglycemia: Secondary | ICD-10-CM

## 2019-10-19 LAB — CBC WITH DIFFERENTIAL/PLATELET
Abs Immature Granulocytes: 0.02 10*3/uL (ref 0.00–0.07)
Basophils Absolute: 0 10*3/uL (ref 0.0–0.1)
Basophils Relative: 0 %
Eosinophils Absolute: 0.3 10*3/uL (ref 0.0–0.5)
Eosinophils Relative: 5 %
HCT: 31.3 % — ABNORMAL LOW (ref 39.0–52.0)
Hemoglobin: 10.2 g/dL — ABNORMAL LOW (ref 13.0–17.0)
Immature Granulocytes: 0 %
Lymphocytes Relative: 15 %
Lymphs Abs: 0.8 10*3/uL (ref 0.7–4.0)
MCH: 32.1 pg (ref 26.0–34.0)
MCHC: 32.6 g/dL (ref 30.0–36.0)
MCV: 98.4 fL (ref 80.0–100.0)
Monocytes Absolute: 0.6 10*3/uL (ref 0.1–1.0)
Monocytes Relative: 11 %
Neutro Abs: 3.7 10*3/uL (ref 1.7–7.7)
Neutrophils Relative %: 69 %
Platelets: 260 10*3/uL (ref 150–400)
RBC: 3.18 MIL/uL — ABNORMAL LOW (ref 4.22–5.81)
RDW: 13.4 % (ref 11.5–15.5)
WBC: 5.4 10*3/uL (ref 4.0–10.5)
nRBC: 0 % (ref 0.0–0.2)

## 2019-10-19 LAB — COMPREHENSIVE METABOLIC PANEL
ALT: 38 U/L (ref 0–44)
AST: 41 U/L (ref 15–41)
Albumin: 1.6 g/dL — ABNORMAL LOW (ref 3.5–5.0)
Alkaline Phosphatase: 110 U/L (ref 38–126)
Anion gap: 11 (ref 5–15)
BUN: 75 mg/dL — ABNORMAL HIGH (ref 6–20)
CO2: 22 mmol/L (ref 22–32)
Calcium: 7.7 mg/dL — ABNORMAL LOW (ref 8.9–10.3)
Chloride: 99 mmol/L (ref 98–111)
Creatinine, Ser: 5.28 mg/dL — ABNORMAL HIGH (ref 0.61–1.24)
GFR calc Af Amer: 13 mL/min — ABNORMAL LOW (ref >60)
GFR calc non Af Amer: 11 mL/min — ABNORMAL LOW (ref >60)
Glucose, Bld: 161 mg/dL — ABNORMAL HIGH (ref 70–99)
Potassium: 5.2 mmol/L — ABNORMAL HIGH (ref 3.5–5.1)
Sodium: 132 mmol/L — ABNORMAL LOW (ref 135–145)
Total Bilirubin: 0.5 mg/dL (ref 0.3–1.2)
Total Protein: 5 g/dL — ABNORMAL LOW (ref 6.5–8.1)

## 2019-10-19 LAB — GLUCOSE, CAPILLARY
Glucose-Capillary: 161 mg/dL — ABNORMAL HIGH (ref 70–99)
Glucose-Capillary: 261 mg/dL — ABNORMAL HIGH (ref 70–99)
Glucose-Capillary: 205 mg/dL — ABNORMAL HIGH (ref 70–99)
Glucose-Capillary: 182 mg/dL — ABNORMAL HIGH (ref 70–99)

## 2019-10-19 MED ORDER — FUROSEMIDE 40 MG PO TABS
80.0000 mg | ORAL_TABLET | Freq: Every day | ORAL | Status: DC
  Administered 2019-10-19 – 2019-10-26 (×8): 80 mg via ORAL
  Filled 2019-10-19 (×7): qty 2

## 2019-10-19 MED ORDER — SODIUM ZIRCONIUM CYCLOSILICATE 10 G PO PACK
10.0000 g | PACK | Freq: Every day | ORAL | Status: DC
  Administered 2019-10-19 – 2019-10-21 (×3): 10 g via ORAL
  Filled 2019-10-19 (×3): qty 1

## 2019-10-19 NOTE — Progress Notes (Signed)
Brief note: Labs noted Decrease Lasix to daily Give Lokelma Will follow  Madelon Lips MD [facility] p[facility] [phone]
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
## 2019-10-19 NOTE — Progress Notes (Signed)
Inpatient Rehabilitation  Patient information reviewed and entered into eRehab system by Ruthell Feigenbaum M. Adelard Sanon, M.A., CCC/SLP, PPS Coordinator.  Information including medical coding, functional ability and quality indicators will be reviewed and updated through discharge.    
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
## 2019-10-20 ENCOUNTER — Inpatient Hospital Stay (HOSPITAL_COMMUNITY): Payer: Medicare Other

## 2019-10-20 ENCOUNTER — Inpatient Hospital Stay (HOSPITAL_COMMUNITY): Payer: Medicare Other | Admitting: Speech Pathology

## 2019-10-20 ENCOUNTER — Inpatient Hospital Stay (HOSPITAL_COMMUNITY): Payer: Medicare Other | Admitting: Physical Therapy

## 2019-10-20 LAB — BASIC METABOLIC PANEL
Anion gap: 12 (ref 5–15)
BUN: 90 mg/dL — ABNORMAL HIGH (ref 6–20)
CO2: 22 mmol/L (ref 22–32)
Calcium: 8 mg/dL — ABNORMAL LOW (ref 8.9–10.3)
Chloride: 96 mmol/L — ABNORMAL LOW (ref 98–111)
Creatinine, Ser: 5.53 mg/dL — ABNORMAL HIGH (ref 0.61–1.24)
GFR calc Af Amer: 12 mL/min — ABNORMAL LOW (ref >60)
GFR calc non Af Amer: 11 mL/min — ABNORMAL LOW (ref >60)
Glucose, Bld: 128 mg/dL — ABNORMAL HIGH (ref 70–99)
Potassium: 5.2 mmol/L — ABNORMAL HIGH (ref 3.5–5.1)
Sodium: 130 mmol/L — ABNORMAL LOW (ref 135–145)

## 2019-10-20 LAB — RENAL FUNCTION PANEL
Albumin: 1.8 g/dL — ABNORMAL LOW (ref 3.5–5.0)
Anion gap: 14 (ref 5–15)
BUN: 82 mg/dL — ABNORMAL HIGH (ref 6–20)
CO2: 21 mmol/L — ABNORMAL LOW (ref 22–32)
Calcium: 8 mg/dL — ABNORMAL LOW (ref 8.9–10.3)
Chloride: 94 mmol/L — ABNORMAL LOW (ref 98–111)
Creatinine, Ser: 5.23 mg/dL — ABNORMAL HIGH (ref 0.61–1.24)
GFR calc Af Amer: 13 mL/min — ABNORMAL LOW (ref >60)
GFR calc non Af Amer: 11 mL/min — ABNORMAL LOW (ref >60)
Glucose, Bld: 240 mg/dL — ABNORMAL HIGH (ref 70–99)
Phosphorus: 6.6 mg/dL — ABNORMAL HIGH (ref 2.5–4.6)
Potassium: 5.1 mmol/L (ref 3.5–5.1)
Sodium: 129 mmol/L — ABNORMAL LOW (ref 135–145)

## 2019-10-20 LAB — CULTURE, BLOOD (ROUTINE X 2)
Culture: NO GROWTH
Culture: NO GROWTH

## 2019-10-20 LAB — GLUCOSE, CAPILLARY
Glucose-Capillary: 174 mg/dL — ABNORMAL HIGH (ref 70–99)
Glucose-Capillary: 246 mg/dL — ABNORMAL HIGH (ref 70–99)
Glucose-Capillary: 204 mg/dL — ABNORMAL HIGH (ref 70–99)
Glucose-Capillary: 130 mg/dL — ABNORMAL HIGH (ref 70–99)

## 2019-10-20 LAB — OSMOLALITY, URINE: Osmolality, Ur: 253 mOsm/kg — ABNORMAL LOW (ref 300–900)

## 2019-10-20 LAB — SODIUM, URINE, RANDOM: Sodium, Ur: 63 mmol/L

## 2019-10-20 LAB — OSMOLALITY: Osmolality: 312 mOsm/kg — ABNORMAL HIGH (ref 275–295)

## 2019-10-20 MED ORDER — ACETAMINOPHEN 325 MG PO TABS
650.0000 mg | ORAL_TABLET | Freq: Four times a day (QID) | ORAL | Status: DC | PRN
  Administered 2019-10-20 – 2019-11-03 (×15): 650 mg via ORAL
  Filled 2019-10-20 (×15): qty 2

## 2019-10-20 MED ORDER — COLLAGENASE 250 UNIT/GM EX OINT
TOPICAL_OINTMENT | Freq: Every day | CUTANEOUS | Status: DC
  Filled 2019-10-20 (×2): qty 30

## 2019-10-20 NOTE — Progress Notes (Signed)
Pt continued to attempt to get out of the bed multiple times. Pt sitting on the side of the bed, falling asleep refusing to lay in the bed.  Per charge RN, pt met criteria for telesitter. Tele sitter order placed for safety. Pt requested to sit in the recliner. Pt was assisted to the recliner, refusing the walker with the transfer. Chair lap belt alarm placed on the pt. Call light, urinal and table all in reach for the pt.
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
## 2019-10-20 NOTE — Progress Notes (Signed)
Orthopedic Tech Progress Note Patient Details:  Dvonta Caltrider December 22, 1960 [id number] [facility] called back and said that they did not have any DARCO shoes in the shop today and that they would have to order some and that I could service the patient with the [facility] shoes that we have here at the hospital. Ortho Devices Type of Ortho Device: Darco shoe Ortho Device/Splint Location: RLE Ortho Device/Splint Interventions: Ordered, Application   Post Interventions Patient Tolerated: Well Instructions Provided: Care of device, Adjustment of device   Janit Pagan 10/20/2019, 12:42 PM

## 2019-10-20 NOTE — Progress Notes (Signed)
Orthopedic Tech Progress Note Patient Details:  Barry Lewis 20-May-1961 [id number] Called in order to [facility] for a [facility] SHOE Patient ID: Barry Lewis, male   DOB: March 19, 1961, 59 y.o.   MRN: [id number]   Janit Pagan 10/20/2019, 11:17 AM

## 2019-10-20 NOTE — Progress Notes (Signed)
Speech Language Pathology Daily Session Note  Patient Details  Name: Barry Lewis MRN: [id number] Date of Birth: July 24, 1961  Today's Date: 10/20/2019 SLP Individual Time: 1005-1045 SLP Individual Time Calculation (min): 40 min  Short Term Goals: Week 1: SLP Short Term Goal 1 (Week 1): Patient will recall new, daily information with Max A multimodal cues for use of external aids. SLP Short Term Goal 2 (Week 1): Patient will demonstrate functional problem solving for basic and familiar tasks with Min A verbal cues. SLP Short Term Goal 3 (Week 1): Patient will self-monitor and correct errors during functional tasks with Mod A verbal cues. SLP Short Term Goal 4 (Week 1): Patient will demonstrate sustained attention to tasks for 30 minutes with Min A verbal cues for redirection.  Skilled Therapeutic Interventions: Skilled treatment session focused on cognitive goals. SLP facilitated session by providing Mod A verbal cues for problem solving and recall during a basic money management task. Patient's problem solving improved with visual aids to maximize recall. Patient lethargic throughout session but was able to sustain attention to task for ~30 minutes with Min verbal cues for redirection. Patient left upright in bed with alarm on and all needs within reach. Continue with current plan of care.       Pain Pain Assessment Pain Scale: 0-10 Pain Score: 6  Pain Type: Acute pain Pain Location: Rib cage Pain Orientation: Right;Left Pain Descriptors / Indicators: Aching Pain Frequency: Intermittent Pain Onset: On-going Patients Stated Pain Goal: 6 Pain Intervention(s): Medication (See eMAR)  Therapy/Group: Individual Therapy  Natally Ribera 10/20/2019, 12:45 PM

## 2019-10-20 NOTE — Progress Notes (Signed)
Occupational Therapy Session Note  Patient Details  Name: Darrelle Bobb MRN: [id number] Date of Birth: [birth year]  Today's Date: 10/20/2019 OT Individual Time: 1300-1411 OT Individual Time Calculation (min): 71 min    Short Term Goals: Week 1:  OT Short Term Goal 1 (Week 1): Pt will complete toilet transfer/toietling wiht S OT Short Term Goal 2 (Week 1): Pt will don B socks wiht AE PRN OT Short Term Goal 3 (Week 1): pt will thread BLE into pants with S OT Short Term Goal 4 (Week 1): Pt will groom in standing with S  Skilled Therapeutic Interventions/Progress Updates:    1:1. Pt received with NT in room with 4/10 back pain premedicated. Pt declines bathing this date but agreeable to change paper scrubs. Pt completes UB with S and LB with CGA. Pt requires mod VC for back precautions throughout session with no recall from previous sessions. Pt completes grooming seated in w/c with set up. Pt completes seated ball toss to rebounder for BUE coordination and cardio conditioning 3x20 passes with increased time for rest break. Pt completes standing card sorting activity with CGA with seated rest break sorting 18 cards. Pt completes seated connect 4 game with continued cues to maintian back precautions. Pt ambulates back to room with CGA and RW with VC for RW management when turning to transfer onto toilet. Exited session with RN in room, seated on toilet and call light in reach  Therapy Documentation Precautions:  Precautions Precautions: Fall, Back Precaution Booklet Issued: No Precaution Comments: Reviewed log roll for comfort due to fracture Required Braces or Orthoses: (md note recommends Prevalon boot) Restrictions Weight Bearing Restrictions: No LUE Weight Bearing: Weight bearing as tolerated RLE Weight Bearing: Weight bearing as tolerated Other Position/Activity Restrictions: Actor for wbing General: General PT Missed Treatment Reason: Nursing care Vital Signs:   Pain: Pain  Assessment Pain Scale: 0-10 Pain Score: 6  Pain Type: Acute pain Pain Location: Rib cage Pain Orientation: Right;Left Pain Descriptors / Indicators: Aching Pain Frequency: Intermittent Pain Onset: On-going Patients Stated Pain Goal: 6 Pain Intervention(s): Medication (See eMAR) ADL:   Vision   Perception    Praxis   Exercises:   Other Treatments:     Therapy/Group: Individual Therapy  Tonny Branch 10/20/2019, 12:59 PM

## 2019-10-20 NOTE — Progress Notes (Signed)
Pts bed alarm continues to go off. Pt found attempting to get out of bed. Pt has been educated on importance of calling staff before trying to get up. Pt verbalized understanding.

## 2019-10-20 NOTE — IPOC Note (Signed)
Overall Plan of Care [facility]) Patient Details Name: Mikal Degrande MRN: [id number] DOB: [birth year]  Admitting Diagnosis: Multiple trauma  Hospital Problems: Principal Problem:   Multiple trauma Active Problems:   Rhabdomyolysis     Functional Problem List: Nursing    PT Balance, Safety, Sensory, Endurance, Skin Integrity, Motor, Pain  OT    SLP Cognition  TR         Basic ADL's: OT Grooming, Bathing, Dressing, Toileting     Advanced  ADL's: OT Simple Meal Preparation, Laundry, Light Housekeeping     Transfers: PT Bed Mobility, Bed to Chair, Car, Manufacturing systems engineer, Metallurgist: PT Ambulation, Stairs     Additional Impairments: OT    SLP Social Cognition   Social Interaction, Problem Solving, Memory, Attention, Awareness  TR      Anticipated Outcomes Item Anticipated Outcome  Self Feeding MOD I  Swallowing      Basic self-care  MOD I  Toileting  MOD I toileting; S shower   Bathroom Transfers S shower MOD I toilet  Bowel/Bladder     Transfers  mod I w/LRAD  Locomotion  Mod I LRAD on level surfaces  Communication     Cognition  Min-Mod A  Pain     Safety/Judgment      Therapy Plan: PT Intensity: Minimum of 1-2 x/day ,45 to 90 minutes PT Frequency: 5 out of 7 days PT Duration Estimated Length of Stay: 11-14 days OT Intensity: Minimum of 1-2 x/day, 45 to 90 minutes OT Frequency: 5 out of 7 days OT Duration/Estimated Length of Stay: 10-12 SLP Intensity: Minumum of 1-2 x/day, 30 to 90 minutes SLP Frequency: 3 to 5 out of 7 days SLP Duration/Estimated Length of Stay: 10-12 days   Due to the current state of emergency, patients may not be receiving their 3-hours of Medicare-mandated therapy.   Team Interventions: Nursing Interventions    PT interventions Ambulation/gait training, DME/adaptive equipment instruction, Neuromuscular re-education, Stair training, UE/LE Strength taining/ROM, Wheelchair propulsion/positioning,  Training and development officer, Discharge planning, Therapeutic Activities, UE/LE Coordination activities, Cognitive remediation/compensation, Functional mobility training, Patient/family education, Therapeutic Exercise  OT Interventions Balance/vestibular training, DME/adaptive equipment instruction, Patient/family education, Therapeutic Activities, Wheelchair propulsion/positioning, Cognitive remediation/compensation, Psychosocial support, Therapeutic Exercise, Community reintegration, Functional mobility training, Self Care/advanced ADL retraining, UE/LE Strength taining/ROM, Discharge planning, Neuromuscular re-education, Skin care/wound managment, UE/LE Coordination activities, Disease mangement/prevention, Pain management, Visual/perceptual remediation/compensation  SLP Interventions Cognitive remediation/compensation, Cueing hierarchy, Environmental controls, Functional tasks, Patient/family education, Internal/external aids  TR Interventions    SW/CM Interventions Discharge Planning, Psychosocial Support, Patient/Family Education   Barriers to Discharge MD  Medical stability  Nursing      PT Decreased caregiver support, Lack of/limited family support, Behavior, Other (comments) cognition, balance impairments, hx substance abuse, 3ste home  OT Decreased caregiver support, Lack of/limited family support does not have 24/7 S may need at d/c  SLP      SW Lack of/limited family support daughter asks if pt "could go to some kind of assisted living place?"  - discussed cost (pt has only Medicare which will not cover that level) - will discuss further   Team Discharge Planning: Destination: PT-Home ,OT-   , SLP-Home Projected Follow-up: PT-Home health PT, 24 hour supervision/assistance, OT-  Home health OT, SLP-Home Health SLP, 24 hour supervision/assistance Projected Equipment Needs: PT-To be determined, OT- Tub/shower bench, SLP-None recommended by SLP Equipment Details: PT-likely need RW but  will further assess, OT-  Patient/family involved in discharge planning: PT-  Patient,  OT-Patient, SLP-Patient  MD ELOS: 10-12 days Medical Rehab Prognosis:  Excellent Assessment: The patient has been admitted for CIR therapies with the diagnosis of polytrauma, encephalopathy. The team will be addressing functional mobility, strength, stamina, balance, safety, adaptive techniques and equipment, self-care, bowel and bladder mgt, patient and caregiver education, NMR, cognition, communication, pain mgt. Goals have been set at mod I for basic mobility and self-care and min to perhaps mod assist with cognition.   Due to the current state of emergency, patients may not be receiving their 3 hours per day of Medicare-mandated therapy.    Meredith Staggers, MD, FAAPMR      See Team Conference Notes for weekly updates to the plan of care

## 2019-10-20 NOTE — Progress Notes (Signed)
[facility] Progress Note    Assessment/ Plan:   1. Acute on CKD IV: Currently on PO 80mg  daily lasix. 2.1L UOP overnight. K 5.1, Na 129. Daily weight pending. Continues to have volume overload. Patient amendable to dialysis. Plan for vein mapping in prep for AVF. - 80mg  PO Lasix QD - Bicarb 650mg  BID - Lokelma 10g QD - daily wts, In/Outs  2. Acute moderate Hyponatremia: Na 132>129 over last 24h.  - obtain urine studies  - cont Lasix - monitor Na  - goal increase by 4-61meq/L/day  2. Hypoalbuminemia: P/C ratio 9.09 consistent with nephrotic syndrome.  - continue Ensure - continue to monitor  3. AMS: slurred speech, pinpoint pupils but A&Ox3  4. Psych: Bipolar  Schizoaffective  Depression: - continue Olanzapine 5mg  qHS, Trazodone 200mg  qHS, Effexor 225mg  QD  5. IDDM:  - continue mSSI, Lantus 15U QD  6. HTN:  - avoid nephrotoxic durgs - hold ACEI - continue Norvasc 10mg  and Hydral 25 TID  7. H/o Multiple falls with fractures/injuries: currently in CIR, working with PT/OT  Subjective:   Patient doing well this morning. Slurring his words somewhat and pinpoint pupils. Required sitter overnight due to continuously gettinggout of bed   Objective:   BP 127/74   Pulse 80   Temp 98 F (36.7 C)   Resp 19   Ht 5\' 6"  (1.676 m)   Wt 85.2 kg   SpO2 94%   BMI 30.32 kg/m   Intake/Output Summary (Last 24 hours) at 10/20/2019 0931 Last data filed at 10/20/2019 0157 Gross per 24 hour  Intake 460 ml  Output 2100 ml  Net -1640 ml   Weight change:   Physical Exam: General: pleasant older caucasion male, in no acute distress with non-toxic appearance HEENT: pinpoint pupils CV: 2/6 murmur on auscultation, regular rate and rhythm, +1 pitting edema in lower extremities Lungs: clear to auscultation bilaterally with normal work of breathing on room air Skin: warm, dry, appears mildly jaundiced Extremities: warm and well perfused Neuro: Alert and orientedx3, speech  slurred, no asterixis   Imaging: No results found.  Labs: BMET Recent Labs  Lab 10/14/19 0554 10/14/19 1542 10/15/19 0649 10/16/19 0215 10/17/19 0355 10/18/19 0520 10/19/19 0521  NA 129* 135 136 132* 136 134* 132*  K 4.5 4.3 4.4 4.8 4.7 4.9 5.2*  CL 104 107 108 102 102 98 99  CO2 15* 17* 18* 18* 21* 21* 22  GLUCOSE 349* 100* 85 177* 144* 214* 161*  BUN 53* 52* 50* 56* 61* 68* 75*  CREATININE 4.46* 4.45* 4.64* 4.93* 4.92* 4.99* 5.28*  CALCIUM 6.7* 7.1* 7.3* 7.1* 7.2* 7.4* 7.7*  PHOS  --   --   --  6.3* 6.2* 5.6*  --    CBC Recent Labs  Lab 10/14/19 0554 10/17/19 0759 10/18/19 0520 10/19/19 0521  WBC 8.0 5.6 5.2 5.4  NEUTROABS  --   --   --  3.7  HGB 10.3* 11.4* 10.1* 10.2*  HCT 31.4* 35.0* 31.0* 31.3*  MCV 99.1 98.9 99.0 98.4  PLT 296 235 232 260    Medications:    . amLODipine  10 mg Oral Daily  . docusate sodium  100 mg Oral BID  . feeding supplement (ENSURE ENLIVE)  237 mL Oral BID BM  . fluticasone  1 spray Each Nare Daily  . folic acid  1 mg Oral Daily  . furosemide  80 mg Oral Daily  . gabapentin  300 mg Oral QHS  . heparin  5,000 Units Subcutaneous Q8H  . hydrALAZINE  25 mg Oral Q8H  . hydrocerin   Topical BID  . insulin aspart  0-15 Units Subcutaneous TID WC  . insulin glargine  15 Units Subcutaneous Daily  . OLANZapine  5 mg Oral QHS  . sodium bicarbonate  650 mg Oral BID  . sodium zirconium cyclosilicate  10 g Oral Daily  . thiamine  100 mg Oral Daily  . traZODone  200 mg Oral QHS  . venlafaxine XR  225 mg Oral Q breakfast      Mina Marble, DO [facility] Family Medicine Resident, PGY2 10/20/2019, 9:31 AM

## 2019-10-20 NOTE — Care Management (Signed)
Inpatient Rehabilitation Center Individual Statement of Services  Patient Name:  Barry Lewis  Date:  10/20/2019  Welcome to the [facility].  Our goal is to provide you with an individualized program based on your diagnosis and situation, designed to meet your specific needs.  With this comprehensive rehabilitation program, you will be expected to participate in at least 3 hours of rehabilitation therapies Monday-Friday, with modified therapy programming on the weekends.  Your rehabilitation program will include the following services:  Physical Therapy (PT), Occupational Therapy (OT), Speech Therapy (ST), 24 hour per day rehabilitation nursing, Therapeutic Recreaction (TR), Neuropsychology, Case Management (Social Worker), Rehabilitation Medicine, Nutrition Services and Pharmacy Services  Weekly team conferences will be held on Tuesdays to discuss your progress.  Your Social Worker will talk with you frequently to get your input and to update you on team discussions.  Team conferences with you and your family in attendance may also be held.  Expected length of stay: 10-14 days   Overall anticipated outcome: supervision/ mod ind  Depending on your progress and recovery, your program may change. Your Social Worker will coordinate services and will keep you informed of any changes. Your Social Worker's name and contact numbers are listed  below.  The following services may also be recommended but are not provided by the [facility] will be made to provide these services after discharge if needed.  Arrangements include referral to agencies that provide these services.  Your insurance has been verified to be:  Medicare Your primary doctor is:  Loletha Grayer, FNP [facility])  Pertinent information will be shared with your doctor and your  insurance company.  Social Worker:  [place], [place] or ([phone]   Information discussed with and copy given to patient by: Lennart Pall, 10/20/2019, 2:23 PM

## 2019-10-20 NOTE — Progress Notes (Signed)
Physical Therapy Session Note  Patient Details  Name: Barry Lewis MRN: [id number] Date of Birth: 03-Mar-1961  Today's Date: 10/20/2019 PT Individual Time: 0815-0912 PT Individual Time Calculation (min): 57 min   Short Term Goals: Week 1:  PT Short Term Goal 1 (Week 1): Pt will demonstrate wc to/from bed/mat w/supervision and safe use of LRAD PT Short Term Goal 2 (Week 1): Gait 263ft w/LRAD and cga PT Short Term Goal 3 (Week 1): ascend/descent 3 stairs w/single rail and cga assist  Skilled Therapeutic Interventions/Progress Updates:   Missed 18 min of skilled PT 2/2 nursing care. Pt in recliner and agreeable to therapy, denies pain at beginning of session. Pt ambulated to therapy gym w/ RW and CGA. Worked on standing balance w/o UE support while performing simple pipe tree task x2 reps w/ close supervision. Verbal reminders to attempt w/o holding onto table and to utilize BUEs. Performed Berg Balance Scale as detailed below, scored 31/56 and explained significance of result to pt. Pt reports his balance feels better now vs prior to coming into hospital, he reports he fell multiple times per day PTA. Encouraged him to continue participating in therapies to work on balance. Worked on gait w/ SPC as this is what he used prior, ambulated 150' x2 w/ min assist. Allowed pt to self-select w/ UE to hold cane and gait pattern, pt w/ established 3-point gait pattern that is effective. NuStep 5 min x2 @ level 4 to work on functional endurance, needed frequent cues to attend to task 2/2 fatigue. Ambulated back to room w/ SPC, min assist. Ended session in supine, all needs in reach. Encouraged pt to rest 2/2 lack of sleep, closed blinds and provided w/ hot packs for pain relief at rib cage. Made RN aware of pt requesting tylenol 2/2 rib pain.   Therapy Documentation Precautions:  Precautions Precautions: Fall, Back Precaution Booklet Issued: No Precaution Comments: Reviewed log roll for comfort due to  fracture Required Braces or Orthoses: (md note recommends Prevalon boot) Restrictions Weight Bearing Restrictions: No LUE Weight Bearing: Weight bearing as tolerated RLE Weight Bearing: Weight bearing as tolerated Other Position/Activity Restrictions: Prevalon Boot for wbing Vital Signs: Therapy Vitals Pulse Rate: 80 BP: 127/74 Oxygen Therapy SpO2: 94 % Balance: Standardized Balance Assessment Standardized Balance Assessment: Berg Balance Test Berg Balance Test Sit to Stand: Able to stand without using hands and stabilize independently Standing Unsupported: Able to stand 2 minutes with supervision Sitting with Back Unsupported but Feet Supported on Floor or Stool: Able to sit safely and securely 2 minutes Stand to Sit: Sits safely with minimal use of hands Transfers: Able to transfer with verbal cueing and /or supervision Standing Unsupported with Eyes Closed: Able to stand 10 seconds with supervision Standing Ubsupported with Feet Together: Needs help to attain position and unable to hold for 15 seconds From Standing, Reach Forward with Outstretched Arm: Reaches forward but needs supervision From Standing Position, Pick up Object from Floor: Able to pick up shoe, needs supervision From Standing Position, Turn to Look Behind Over each Shoulder: Turn sideways only but maintains balance Turn 360 Degrees: Needs close supervision or verbal cueing Standing Unsupported, Alternately Place Feet on Step/Stool: Able to complete >2 steps/needs minimal assist Standing Unsupported, One Foot in Front: Able to take small step independently and hold 30 seconds Standing on One Leg: Tries to lift leg/unable to hold 3 seconds but remains standing independently Total Score: 31  Therapy/Group: Individual Therapy  Hailey Miles Clent Demark 10/20/2019, 9:16 AM

## 2019-10-20 NOTE — Progress Notes (Signed)
Pt c/o of 10/10 pain. Pt has notified staff about the increased pain multiple times. This nurse assessed the pt, no signs of obvious  New injuries noted. When asked where the pain is located, pt states all over. Pt has been educated on the Oxy Q4h/PRN order for pain medication, pt continues to request more. Pt also c/o of not being able to sleep and needs something stronger to help.

## 2019-10-20 NOTE — Progress Notes (Signed)
[facility] PHYSICAL MEDICINE & REHABILITATION PROGRESS NOTE   Subjective/Complaints: No new issues this morning. Still feels tired and doesn't have much stamina. Asked when he can expect that to improve. Pain tolerable.   ROS: Patient denies fever, rash, sore throat, blurred vision, nausea, vomiting, diarrhea, cough, shortness of breath or chest pain,  headache, or mood change. .   Objective:   No results found. Recent Labs    10/18/19 0520 10/19/19 0521  WBC 5.2 5.4  HGB 10.1* 10.2*  HCT 31.0* 31.3*  PLT 232 260   Recent Labs    10/19/19 0521 10/20/19 0955  NA 132* 129*  K 5.2* 5.1  CL 99 94*  CO2 22 21*  GLUCOSE 161* 240*  BUN 75* 82*  CREATININE 5.28* 5.23*  CALCIUM 7.7* 8.0*    Intake/Output Summary (Last 24 hours) at 10/20/2019 1107 Last data filed at 10/20/2019 0157 Gross per 24 hour  Intake 460 ml  Output 2100 ml  Net -1640 ml     Physical Exam: Vital Signs Blood pressure 127/74, pulse 80, temperature 98 F (36.7 C), resp. rate 19, height 5\' 6"  (1.676 m), weight 85.2 kg, SpO2 94 %. Constitutional: No distress . Vital signs reviewed. HEENT: EOMI, oral membranes moist Neck: supple Cardiovascular: RRR without murmur. No JVD    Respiratory: CTA Bilaterally without wheezes or rales. Normal effort    GI: BS +, non-tender, non-distended  Musculoskeletal:     Comments: No edema or tenderness in extremities  Neurological: He is alert.  Oriented to place person. Fair insight. STM deficits present.  Motor: Bilateral upper extremities: 4+/5 proximal distal right lower extremity: Hip flexion, knee extension 4/5, ankle dorsiflexion 4+/5 Left lower extremity: Hip flexion, knee extension 4 -/5, ankle dorsiflexion 4+/5--motor exam stable. Sensation diminished to light touch bilateral feet  Skin:  Eschar, dried blood on right 1st MT head. Difficult to assess depth d/t appearance. Scattered abrasions on other limbs, muliple tats Psychiatric: very  pleasant    Assessment/Plan: 1. Functional deficits secondary to encephalopathy, polytrauma which require 3+ hours per day of interdisciplinary therapy in a comprehensive inpatient rehab setting.  Physiatrist is providing close team supervision and 24 hour management of active medical problems listed below.  Physiatrist and rehab team continue to assess barriers to discharge/monitor patient progress toward functional and medical goals  Care Tool:  Bathing    Body parts bathed by patient: Right arm, Left arm, Chest, Abdomen, Front perineal area, Right upper leg, Left upper leg, Right lower leg, Left lower leg, Face   Body parts bathed by helper: Buttocks     Bathing assist Assist Level: Minimal Assistance - Patient > 75%     Upper Body Dressing/Undressing Upper body dressing   What is the patient wearing?: Pull over shirt    Upper body assist Assist Level: Minimal Assistance - Patient > 75%    Lower Body Dressing/Undressing Lower body dressing      What is the patient wearing?: Pants     Lower body assist Assist for lower body dressing: Moderate Assistance - Patient 50 - 74%     Toileting Toileting    Toileting assist Assist for toileting: Contact Guard/Touching assist Assistive Device Comment: urinal   Transfers Chair/bed transfer  Transfers assist     Chair/bed transfer assist level: Minimal Assistance - Patient > 75%     Locomotion Ambulation   Ambulation assist      Assist level: Minimal Assistance - Patient > 75% Assistive device: Cane-straight Max distance:  150'   Walk 10 feet activity   Assist     Assist level: Minimal Assistance - Patient > 75% Assistive device: Cane-straight   Walk 50 feet activity   Assist    Assist level: Minimal Assistance - Patient > 75% Assistive device: Cane-straight    Walk 150 feet activity   Assist    Assist level: Minimal Assistance - Patient > 75% Assistive device: Cane-straight    Walk 10  feet on uneven surface  activity   Assist     Assist level: Moderate Assistance - Patient - 50 - 74% Assistive device: Aeronautical engineer Will patient use wheelchair at discharge?: No             Wheelchair 50 feet with 2 turns activity    Assist            Wheelchair 150 feet activity     Assist          Blood pressure 127/74, pulse 80, temperature 98 F (36.7 C), resp. rate 19, height 5\' 6"  (1.676 m), weight 85.2 kg, SpO2 94 %.  Medical Problem List and Plan: 1.  Debility/encephalopathy secondary to polytrauma.             -patient may not shower             -ELOS/Goals: 12-16 days/supervision.             -continue CIR therapies including PT and OT and SLP  -will order darco shoe for right foot to off load 1st MT head  -discussed expected course of recovery, fatigue, etc with pt 2.  Antithrombotics: -DVT/anticoagulation:  SQ Heparin             -antiplatelet therapy: N/A 3. Pain Management/Chronic back pain: Neurontin 300 mg nightly, trazodone 200 mg nightly oxycodone as needed 4. Mood:/Dementia With history of bipolar disorder.  Effexor 225 mg daily             -antipsychotic agents: Zyprexa 5 mg nightly  -mood seems controlled and is sleeping at present 5. Neuropsych: This patient is capable of making decisions on his own behalf. 6. Skin/Wound Care:  2/5 -will change to santyl dressing for right 1st MT areas of eschar/callus   -darco shoe ordered to off load forefoot 7. Fluids/Electrolytes/Nutrition: Routine in and outs.  CMP ordered. 8.  Acute on chronic renal failure stage IV.  Baseline creatinine 2.2-2.7.  Follow-up renal services.  Continue Lasix 80 mg twice daily.  No current plan for hemodialysis              2/5: Cr holding at 5.23 today--has been steadily rising   -nephrology following, decreased lasix, lokelma started   -plan was for vein mapping in prep of AVF   -U/O 1350cc yesterday 9.  Rhabdomyolysis.  CK  trending down. 10.  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  Hemoglobin A1c 8.5.  Lantus insulin 15 units daily.  Check blood sugars before meals and at bedtime.  Diabetic teaching             2/5 sugars remain elevate.   -increase lantus to 18u daily.  -cover sith SSI 11. Hypertension.Norvasc 10 mg daily,Hydralazine 25 mg every 8 hours             2/5 controlled. 12.  Combined systolic and diastolic congestive heart failure.  Monitor for any signs of fluid overload.  Continue diuresis  2/5 Daily weights needed. None today   Filed Weights   10/18/19 1425 10/19/19 0341  Weight: 89.2 kg 85.2 kg    13.  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  Continue Prevalon boot.  Follow-up outpatient Dr. Sharol Given to plan for debridement.  No surgical intervention necessary at this time  -see above #6 14.  Left pleural effusion with history of COPD.  Continue diuresis.  May need thoracentesis.  Follow-up chest x-ray as needed  IS, OOB 15.  Destructive process involving sternum suspect old fracture versus infection.  Latest blood cultures no growth to date.  Radiologist reviewed likely chronic smoldering process.  Follow-up outpatient CVTS.  -rx pain as needed, appears controlled 16.  T3 as well as T1 and T2 right transverse process fractures.  Conservative care per Dr. Ashok Pall 17.  Right scapular fracture.  Weightbearing as tolerated 18.  Bilateral rib fractures.  Conservative care. 19.  History of polysubstance abuse tobacco and alcohol abuse.  Urine drug screen negative.  Provide counseling 20.  Medical noncompliance.  Provide counseling    LOS: 2 days A FACE TO FACE EVALUATION WAS PERFORMED  Meredith Staggers 10/20/2019, 11:07 AM

## 2019-10-21 ENCOUNTER — Inpatient Hospital Stay (HOSPITAL_COMMUNITY): Payer: Medicare Other | Admitting: Speech Pathology

## 2019-10-21 ENCOUNTER — Inpatient Hospital Stay (HOSPITAL_COMMUNITY): Payer: Medicare Other | Admitting: Physical Therapy

## 2019-10-21 ENCOUNTER — Inpatient Hospital Stay (HOSPITAL_COMMUNITY): Payer: Medicare Other

## 2019-10-21 LAB — RENAL FUNCTION PANEL
Albumin: 2 g/dL — ABNORMAL LOW (ref 3.5–5.0)
Anion gap: 15 (ref 5–15)
BUN: 91 mg/dL — ABNORMAL HIGH (ref 6–20)
CO2: 20 mmol/L — ABNORMAL LOW (ref 22–32)
Calcium: 8.3 mg/dL — ABNORMAL LOW (ref 8.9–10.3)
Chloride: 95 mmol/L — ABNORMAL LOW (ref 98–111)
Creatinine, Ser: 5.26 mg/dL — ABNORMAL HIGH (ref 0.61–1.24)
GFR calc Af Amer: 13 mL/min — ABNORMAL LOW (ref >60)
GFR calc non Af Amer: 11 mL/min — ABNORMAL LOW (ref >60)
Glucose, Bld: 233 mg/dL — ABNORMAL HIGH (ref 70–99)
Phosphorus: 6.7 mg/dL — ABNORMAL HIGH (ref 2.5–4.6)
Potassium: 5.1 mmol/L (ref 3.5–5.1)
Sodium: 130 mmol/L — ABNORMAL LOW (ref 135–145)

## 2019-10-21 LAB — GLUCOSE, CAPILLARY
Glucose-Capillary: 123 mg/dL — ABNORMAL HIGH (ref 70–99)
Glucose-Capillary: 216 mg/dL — ABNORMAL HIGH (ref 70–99)
Glucose-Capillary: 188 mg/dL — ABNORMAL HIGH (ref 70–99)
Glucose-Capillary: 198 mg/dL — ABNORMAL HIGH (ref 70–99)

## 2019-10-21 LAB — IRON AND TIBC
Iron: 65 ug/dL (ref 45–182)
Saturation Ratios: 26 % (ref 17.9–39.5)
TIBC: 252 ug/dL (ref 250–450)
UIBC: 187 ug/dL

## 2019-10-21 LAB — FERRITIN: Ferritin: 345 ng/mL — ABNORMAL HIGH (ref 24–336)

## 2019-10-21 LAB — HIV ANTIBODY (ROUTINE TESTING W REFLEX): HIV Screen 4th Generation wRfx: NONREACTIVE

## 2019-10-21 LAB — HEPATITIS C ANTIBODY: HCV Ab: REACTIVE — AB

## 2019-10-21 LAB — HEPATITIS B SURFACE ANTIGEN: Hepatitis B Surface Ag: NONREACTIVE

## 2019-10-21 MED ORDER — SODIUM BICARBONATE 650 MG PO TABS
1300.0000 mg | ORAL_TABLET | Freq: Two times a day (BID) | ORAL | Status: DC
  Administered 2019-10-21 – 2019-11-03 (×26): 1300 mg via ORAL
  Filled 2019-10-21 (×27): qty 2

## 2019-10-21 NOTE — Progress Notes (Signed)
Occupational Therapy Session Note  Patient Details  Name: Barry Lewis MRN: [id number] Date of Birth: 1961/07/12  Today's Date: 10/21/2019 OT Individual Time: 1300-1400 OT Individual Time Calculation (min): 60 min    Short Term Goals: Week 1:  OT Short Term Goal 1 (Week 1): Pt will complete toilet transfer/toietling wiht S OT Short Term Goal 2 (Week 1): Pt will don B socks wiht AE PRN OT Short Term Goal 3 (Week 1): pt will thread BLE into pants with S OT Short Term Goal 4 (Week 1): Pt will groom in standing with S  Skilled Therapeutic Interventions/Progress Updates:    Pt received sitting up in the recliner with no c/o pain. Reviewed chart and discussed with pt need for darco boot on RLE for diabetic ulcers- per review of orthopedic consult, no mention of need for darco boot nor any orders indicating need. Pt completed functional mobility into bathroom with CGA using RW. Pt required min cueing for use of TTB in walk in shower. Discussed home set up and use of TTB for shower safety at home. Pt's R foot was covered for shower to ensure ulcers remained dry. Pt alert and oriented throughout session. Pt completed all bathing in shower with (S), sit <> stand from bench. Pt required intermittent cueing for seated LE reaching 2/2 back precautions. Pt donned shirt (S), pants CGA. Pt able to complete oral care in standing at the sink with (S), cueing/edu provided re strategies to reduce need for bending. Pt was taken to the ADL apt where he practiced a tub transfer using a TTB. He returned demonstration with CGA, min cueing for reducing twisting through back. Pt was left sitting up in the recliner with all needs met, chair alarm set.  Therapy Documentation Precautions:  Precautions Precautions: Fall, Back Precaution Booklet Issued: No Precaution Comments: Reviewed log roll for comfort due to fracture Required Braces or Orthoses: (md note recommends Prevalon boot) Restrictions Weight Bearing  Restrictions: No LUE Weight Bearing: Weight bearing as tolerated RLE Weight Bearing: Weight bearing as tolerated Other Position/Activity Restrictions: Prevalon Boot for wbing   Therapy/Group: Individual Therapy  Curtis Sites 10/21/2019, 7:27 AM

## 2019-10-21 NOTE — Progress Notes (Signed)
Speech Language Pathology Daily Session Note  Patient Details  Name: Barry Lewis MRN: [id number] Date of Birth: 11-26-1960  Today's Date: 10/21/2019 SLP Individual Time: [id number] SLP Individual Time Calculation (min): 60 min  Short Term Goals: Week 1: SLP Short Term Goal 1 (Week 1): Patient will recall new, daily information with Max A multimodal cues for use of external aids. SLP Short Term Goal 2 (Week 1): Patient will demonstrate functional problem solving for basic and familiar tasks with Min A verbal cues. SLP Short Term Goal 3 (Week 1): Patient will self-monitor and correct errors during functional tasks with Mod A verbal cues. SLP Short Term Goal 4 (Week 1): Patient will demonstrate sustained attention to tasks for 30 minutes with Min A verbal cues for redirection.  Skilled Therapeutic Interventions:  Pt was seen for skilled ST targeting cognitive goals.  Pt was seated in recliner upon therapist's arrival, pleasantly interactive and agreeable to participating in therapy.  Pt could recall general goal of this morning's PT session, stating "We worked on my balance," with min question cues. SLP facilitated the session with a novel grocery planning task to address goals for recall, problem solving and attention to task.  Pt sustained his attention to task for >10 minute intervals with no cues needed for redirection and even demonstrated appropriate ability to return to where he left off in task after taking a break to use the urinal (which he initiated request for independently).  Pt utilized "chunking" and "rounding up" as organizational strategies to improve accuracy for completing functional math calculations related to grocery task with min assist instructional cues.  Pt verbalized feeling "stressed" about being able to pay his bills while here in the hospital, specifically his rent; however, he reported that he has already contacted his landlord to inform him of his current hospitalization.   Pt was left in recliner with chair alarm set and call bell within reach.  Continue per current plan of care.    Pain Pain Assessment Pain Scale: 0-10 Pain Score: 0-No pain  Therapy/Group: Individual Therapy  Nakeita Styles, Selinda Orion 10/21/2019, 10:44 AM

## 2019-10-21 NOTE — Progress Notes (Addendum)
[facility] Progress Note    Assessment/ Plan:   1. AKI on CKD IV: Baseline 2.2-2.7.  Consistent w/ nephrotic syndrome, serologies pending but suspect diabetic nephropathy.  Good UOP w/ lasix but slowly uptrending Cr/BUN (4.99 > 5.28 >5.53), labs pending this am (2/6), will follow up in afternoon.  Will continue Lasix for now and obtain vein mapping.   2. Chronic hyponatremia: Stable-Na 130 today, likely hypervolemic hyponatremia d/t renal insufficiency.  Will continue with Lasix and fluid restriction (added 1.5L restriction), follow up labs this afternoon.   3. Hypertension: Elevated SBP average 150's  Continue Norvasc and hydralazine, holding ACEi. May need to inc hydralazine in the future.    4. Hyperkalemia: K 5.2 on 2/5, BMP pending. Will treat as accordingly.  5. 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  6. IDDM: On insulin, per primary.   Subjective:   Seen while working with PT.  Feeling good this morning.  Breathing and swelling improving.   Objective:   BP (!) 157/81 (BP Location: Right Arm)   Pulse 78   Temp (!) 97.4 F (36.3 C) (Oral)   Resp 16   Ht 5\' 6"  (1.676 m)   Wt 85.1 kg   SpO2 98%   BMI 30.28 kg/m   Intake/Output Summary (Last 24 hours) at 10/21/2019 0740 Last data filed at 10/21/2019 0400 Gross per 24 hour  Intake 720 ml  Output 1600 ml  Net -880 ml   Weight change:   Physical Exam: General: Alert, NAD, walking with PT currently  Cardiac: RRR  Lungs: Clear bilaterally, no increased WOB after walking down hallway, on RA   Abdomen: soft, non-tender Msk: Moves all extremities spontaneously, walking w/ PT/walker assistance   Ext: Warm, dry, 1+ BLE edema to level of knee.    Imaging: No results found.  Labs: BMET Recent Labs  Lab 10/15/19 0649 10/16/19 0215 10/17/19 0355 10/18/19 0520 10/19/19 0521 10/20/19 0955 10/20/19 1934  NA 136 132* 136 134* 132* 129* 130*  K 4.4 4.8 4.7 4.9 5.2* 5.1 5.2*   CL 108 102 102 98 99 94* 96*  CO2 18* 18* 21* 21* 22 21* 22  GLUCOSE 85 177* 144* 214* 161* 240* 128*  BUN 50* 56* 61* 68* 75* 82* 90*  CREATININE 4.64* 4.93* 4.92* 4.99* 5.28* 5.23* 5.53*  CALCIUM 7.3* 7.1* 7.2* 7.4* 7.7* 8.0* 8.0*  PHOS  --  6.3* 6.2* 5.6*  --  6.6*  --    CBC Recent Labs  Lab 10/17/19 0759 10/18/19 0520 10/19/19 0521  WBC 5.6 5.2 5.4  NEUTROABS  --   --  3.7  HGB 11.4* 10.1* 10.2*  HCT 35.0* 31.0* 31.3*  MCV 98.9 99.0 98.4  PLT 235 232 260    Medications:    . amLODipine  10 mg Oral Daily  . collagenase   Topical Daily  . docusate sodium  100 mg Oral BID  . feeding supplement (ENSURE ENLIVE)  237 mL Oral BID BM  . fluticasone  1 spray Each Nare Daily  . folic acid  1 mg Oral Daily  . furosemide  80 mg Oral Daily  . gabapentin  300 mg Oral QHS  . heparin  5,000 Units Subcutaneous Q8H  . hydrALAZINE  25 mg Oral Q8H  . hydrocerin   Topical BID  . insulin aspart  0-15 Units Subcutaneous TID WC  . insulin glargine  15 Units Subcutaneous Daily  . OLANZapine  5 mg Oral QHS  . sodium  bicarbonate  650 mg Oral BID  . sodium zirconium cyclosilicate  10 g Oral Daily  . thiamine  100 mg Oral Daily  . traZODone  200 mg Oral QHS  . venlafaxine XR  225 mg Oral Q breakfast      Darrelyn Hillock, DO  Family Medicine PGY-2  10/21/2019, 7:40 AM

## 2019-10-21 NOTE — Progress Notes (Signed)
[facility] PHYSICAL MEDICINE & REHABILITATION PROGRESS NOTE   Subjective/Complaints:   Pt reports no issues- said eating and drinking and sleeping well.   K+ 5.1 and Na 130  ROS: Patient denies fever, rash, sore throat, blurred vision, nausea, vomiting, diarrhea, cough, shortness of breath or chest pain,  headache, or mood change. .   Objective:   No results found. Recent Labs    10/19/19 0521  WBC 5.4  HGB 10.2*  HCT 31.3*  PLT 260   Recent Labs    10/20/19 1934 10/21/19 1211  NA 130* 130*  K 5.2* 5.1  CL 96* 95*  CO2 22 20*  GLUCOSE 128* 233*  BUN 90* 91*  CREATININE 5.53* 5.26*  CALCIUM 8.0* 8.3*    Intake/Output Summary (Last 24 hours) at 10/21/2019 1436 Last data filed at 10/21/2019 1300 Gross per 24 hour  Intake 720 ml  Output 2075 ml  Net -1355 ml     Physical Exam: Vital Signs Blood pressure 138/75, pulse 74, temperature 98.2 F (36.8 C), temperature source Oral, resp. rate 18, height 5\' 6"  (1.676 m), weight 85.1 kg, SpO2 99 %. Constitutional: No distress . Vital signs and labs reviewed. HEENT: EOMI, oral membranes moist Neck: supple Cardiovascular: RRR without murmur. No JVD    Respiratory: CTA Bilaterally without wheezes or rales. Normal effort    GI: BS +, non-tender, non-distended  Musculoskeletal:     Comments: No edema or tenderness in extremities  Neurological: He is alert.  Oriented to place person. Fair insight. STM deficits present.  Motor: Bilateral upper extremities: 4+/5 proximal distal right lower extremity: Hip flexion, knee extension 4/5, ankle dorsiflexion 4+/5 Left lower extremity: Hip flexion, knee extension 4 -/5, ankle dorsiflexion 4+/5--motor exam stable. Sensation diminished to light touch bilateral feet  Skin:  Eschar, dried blood on right 1st MT head. Difficult to assess depth d/t appearance. Scattered abrasions on other limbs, muliple tats Psychiatric: very pleasant    Assessment/Plan: 1. Functional deficits secondary  to encephalopathy, polytrauma which require 3+ hours per day of interdisciplinary therapy in a comprehensive inpatient rehab setting.  Physiatrist is providing close team supervision and 24 hour management of active medical problems listed below.  Physiatrist and rehab team continue to assess barriers to discharge/monitor patient progress toward functional and medical goals  Care Tool:  Bathing    Body parts bathed by patient: Right arm, Left arm, Chest, Abdomen, Front perineal area, Right upper leg, Left upper leg, Right lower leg, Left lower leg, Face, Buttocks   Body parts bathed by helper: Buttocks     Bathing assist Assist Level: Supervision/Verbal cueing     Upper Body Dressing/Undressing Upper body dressing   What is the patient wearing?: Pull over shirt    Upper body assist Assist Level: Supervision/Verbal cueing    Lower Body Dressing/Undressing Lower body dressing      What is the patient wearing?: Pants     Lower body assist Assist for lower body dressing: Contact Guard/Touching assist     Toileting Toileting    Toileting assist Assist for toileting: Supervision/Verbal cueing Assistive Device Comment: urinal   Transfers Chair/bed transfer  Transfers assist     Chair/bed transfer assist level: Contact Guard/Touching assist     Locomotion Ambulation   Ambulation assist      Assist level: Minimal Assistance - Patient > 75% Assistive device: Walker-rolling Max distance: 150'   Walk 10 feet activity   Assist     Assist level: Minimal Assistance - Patient >  75% Assistive device: Walker-rolling   Walk 50 feet activity   Assist    Assist level: Minimal Assistance - Patient > 75% Assistive device: Walker-rolling    Walk 150 feet activity   Assist    Assist level: Minimal Assistance - Patient > 75% Assistive device: Walker-rolling    Walk 10 feet on uneven surface  activity   Assist     Assist level: Moderate Assistance -  Patient - 50 - 74% Assistive device: Aeronautical engineer Will patient use wheelchair at discharge?: No             Wheelchair 50 feet with 2 turns activity    Assist            Wheelchair 150 feet activity     Assist          Blood pressure 138/75, pulse 74, temperature 98.2 F (36.8 C), temperature source Oral, resp. rate 18, height 5\' 6"  (1.676 m), weight 85.1 kg, SpO2 99 %.  Medical Problem List and Plan: 1.  Debility/encephalopathy secondary to polytrauma.             -patient may not shower             -ELOS/Goals: 12-16 days/supervision.             -continue CIR therapies including PT and OT and SLP  -will order darco shoe for right foot to off load 1st MT head  -discussed expected course of recovery, fatigue, etc with pt 2.  Antithrombotics: -DVT/anticoagulation:  SQ Heparin             -antiplatelet therapy: N/A 3. Pain Management/Chronic back pain: Neurontin 300 mg nightly, trazodone 200 mg nightly oxycodone as needed 4. Mood:/Dementia With history of bipolar disorder.  Effexor 225 mg daily             -antipsychotic agents: Zyprexa 5 mg nightly  -mood seems controlled and is sleeping at present 5. Neuropsych: This patient is capable of making decisions on his own behalf. 6. Skin/Wound Care:  2/5 -will change to santyl dressing for right 1st MT areas of eschar/callus   -darco shoe ordered to off load forefoot 7. Fluids/Electrolytes/Nutrition: Routine in and outs.  CMP ordered. 8.  Acute on chronic renal failure stage IV.  Baseline creatinine 2.2-2.7.  Follow-up renal services.  Continue Lasix 80 mg twice daily.  No current plan for hemodialysis              2/5: Cr holding at 5.23 today--has been steadily rising   -nephrology following, decreased lasix, lokelma started   -plan was for vein mapping in prep of AVF   -U/O 1350cc yesterday  2/6- Cr 5.26; BUN 91- holding steady 9.  Rhabdomyolysis.  CK trending down. 10.   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  Hemoglobin A1c 8.5.  Lantus insulin 15 units daily.  Check blood sugars before meals and at bedtime.  Diabetic teaching             2/5 sugars remain elevate.   -increase lantus to 18u daily.  -cover sith SSI 11. Hypertension.Norvasc 10 mg daily,Hydralazine 25 mg every 8 hours             2/6 controlled; con't regimen 12.  Combined systolic and diastolic congestive heart failure.  Monitor for any signs of fluid overload.  Continue diuresis             2/5 Daily  weights needed. None today  2/6- Weight very stable- con't regimen   Filed Weights   10/18/19 1425 10/19/19 0341 10/21/19 0348  Weight: 89.2 kg 85.2 kg 85.1 kg    13.  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  Continue Prevalon boot.  Follow-up outpatient Dr. Sharol Given to plan for debridement.  No surgical intervention necessary at this time  -see above #6 14.  Left pleural effusion with history of COPD.  Continue diuresis.  May need thoracentesis.  Follow-up chest x-ray as needed  IS, OOB 15.  Destructive process involving sternum suspect old fracture versus infection.  Latest blood cultures no growth to date.  Radiologist reviewed likely chronic smoldering process.  Follow-up outpatient CVTS.  -rx pain as needed, appears controlled 16.  T3 as well as T1 and T2 right transverse process fractures.  Conservative care per Dr. Ashok Pall 17.  Right scapular fracture.  Weightbearing as tolerated 18.  Bilateral rib fractures.  Conservative care. 19.  History of polysubstance abuse tobacco and alcohol abuse.  Urine drug screen negative.  Provide counseling 20.  Medical noncompliance.  Provide counseling    LOS: 3 days A FACE TO FACE EVALUATION WAS PERFORMED  Madalen Gavin 10/21/2019, 2:36 PM

## 2019-10-21 NOTE — Progress Notes (Signed)
Physical Therapy Session Note  Patient Details  Name: Barry Lewis MRN: [id number] Date of Birth: 08-24-1961  Today's Date: 10/21/2019 PT Individual Time: 0800-0900 PT Individual Time Calculation (min): 60 min   Short Term Goals: Week 1:  PT Short Term Goal 1 (Week 1): Pt will demonstrate wc to/from bed/mat w/supervision and safe use of LRAD PT Short Term Goal 2 (Week 1): Gait 238ft w/LRAD and cga PT Short Term Goal 3 (Week 1): ascend/descent 3 stairs w/single rail and cga assist  Skilled Therapeutic Interventions/Progress Updates:    Pt received seated in bed, agreeable to PT session. No complaints of pain initially, does have onset of R rib pain with mobility, not rated. Supine to sit with CGA for balance. Pt is dependent to don R darco shoe while seated EOB. Attempt sit to stand with SPC, pt unable to safely stand. Sit to stand with min A to RW from elevated bed. Ambulation 2 x 150 ft, 2 x 50 ft with RW and min A for balance. Pt with decreased standing balance due to darco shoe on RLE creating leg length discrepancy. Standing alt L/R 4" step taps with RW and CGA for balance for standing balance and BLE coordination training. Standing ball toss with no UE support and min to mod A for balance, addition of cognitive task of naming foods then animals. Pt exhibits decreased balance with addition of cognitive task and needs increased time and cues to recall names of things. Pt left seated in recliner in room with needs in reach, quick release belt and chair alarm in place at end of session.  Therapy Documentation Precautions:  Precautions Precautions: Fall, Back Precaution Booklet Issued: No Precaution Comments: Reviewed log roll for comfort due to fracture Required Braces or Orthoses: (md note recommends Prevalon boot) Restrictions Weight Bearing Restrictions: No LUE Weight Bearing: Weight bearing as tolerated RLE Weight Bearing: Weight bearing as tolerated Other Position/Activity  Restrictions: Prevalon Boot for wbing    Therapy/Group: Individual Therapy   Excell Seltzer, PT, DPT  10/21/2019, 11:57 AM

## 2019-10-22 ENCOUNTER — Inpatient Hospital Stay (HOSPITAL_COMMUNITY): Payer: Medicare Other

## 2019-10-22 ENCOUNTER — Inpatient Hospital Stay (HOSPITAL_COMMUNITY): Payer: Medicare Other | Admitting: Physical Therapy

## 2019-10-22 DIAGNOSIS — S42102A Fracture of unspecified part of scapula, left shoulder, initial encounter for closed fracture: Secondary | ICD-10-CM

## 2019-10-22 DIAGNOSIS — N186 End stage renal disease: Secondary | ICD-10-CM

## 2019-10-22 LAB — RENAL FUNCTION PANEL
Albumin: 1.8 g/dL — ABNORMAL LOW (ref 3.5–5.0)
Anion gap: 12 (ref 5–15)
BUN: 95 mg/dL — ABNORMAL HIGH (ref 6–20)
CO2: 20 mmol/L — ABNORMAL LOW (ref 22–32)
Calcium: 7.5 mg/dL — ABNORMAL LOW (ref 8.9–10.3)
Chloride: 96 mmol/L — ABNORMAL LOW (ref 98–111)
Creatinine, Ser: 5.28 mg/dL — ABNORMAL HIGH (ref 0.61–1.24)
GFR calc Af Amer: 13 mL/min — ABNORMAL LOW (ref >60)
GFR calc non Af Amer: 11 mL/min — ABNORMAL LOW (ref >60)
Glucose, Bld: 166 mg/dL — ABNORMAL HIGH (ref 70–99)
Phosphorus: 6.6 mg/dL — ABNORMAL HIGH (ref 2.5–4.6)
Potassium: 5.4 mmol/L — ABNORMAL HIGH (ref 3.5–5.1)
Sodium: 128 mmol/L — ABNORMAL LOW (ref 135–145)

## 2019-10-22 LAB — GLUCOSE, CAPILLARY
Glucose-Capillary: 153 mg/dL — ABNORMAL HIGH (ref 70–99)
Glucose-Capillary: 253 mg/dL — ABNORMAL HIGH (ref 70–99)
Glucose-Capillary: 205 mg/dL — ABNORMAL HIGH (ref 70–99)
Glucose-Capillary: 230 mg/dL — ABNORMAL HIGH (ref 70–99)

## 2019-10-22 LAB — CK TOTAL AND CKMB (NOT AT ARMC)
CK, MB: 8.4 ng/mL — ABNORMAL HIGH (ref 0.5–5.0)
Relative Index: INVALID (ref 0.0–2.5)
Total CK: 60 U/L (ref 49–397)

## 2019-10-22 LAB — TROPONIN I (HIGH SENSITIVITY)
Troponin I (High Sensitivity): 50 ng/L — ABNORMAL HIGH (ref <18)
Troponin I (High Sensitivity): 38 ng/L — ABNORMAL HIGH (ref <18)

## 2019-10-22 LAB — PTH, INTACT AND CALCIUM
Calcium, Total (PTH): 7.8 mg/dL — ABNORMAL LOW (ref 8.7–10.2)
PTH: 121 pg/mL — ABNORMAL HIGH (ref 15–65)

## 2019-10-22 LAB — C4 COMPLEMENT: Complement C4, Body Fluid: 53 mg/dL — ABNORMAL HIGH (ref 12–38)

## 2019-10-22 LAB — C3 COMPLEMENT: C3 Complement: 168 mg/dL — ABNORMAL HIGH (ref 82–167)

## 2019-10-22 MED ORDER — NITROGLYCERIN 0.4 MG SL SUBL
0.4000 mg | SUBLINGUAL_TABLET | SUBLINGUAL | Status: DC | PRN
  Administered 2019-10-22 – 2019-10-23 (×6): 0.4 mg via SUBLINGUAL
  Filled 2019-10-22 (×4): qty 1

## 2019-10-22 MED ORDER — SODIUM ZIRCONIUM CYCLOSILICATE 10 G PO PACK
10.0000 g | PACK | Freq: Every day | ORAL | Status: DC
  Administered 2019-10-22: 10 g via ORAL
  Filled 2019-10-22 (×2): qty 1

## 2019-10-22 MED ORDER — MORPHINE SULFATE (PF) 2 MG/ML IV SOLN
2.0000 mg | Freq: Once | INTRAVENOUS | Status: AC
  Administered 2019-10-22: 2 mg via INTRAVENOUS
  Filled 2019-10-22: qty 1

## 2019-10-22 MED ORDER — MORPHINE SULFATE (PF) 2 MG/ML IV SOLN
2.0000 mg | Freq: Once | INTRAVENOUS | Status: AC
  Administered 2019-10-22: 01:00:00 2 mg via INTRAVENOUS
  Filled 2019-10-22: qty 1

## 2019-10-22 MED ORDER — NITROGLYCERIN 0.4 MG SL SUBL
SUBLINGUAL_TABLET | SUBLINGUAL | Status: AC
  Administered 2019-10-22 (×3): 0.4 mg
  Filled 2019-10-22: qty 1

## 2019-10-22 MED ORDER — ASPIRIN 81 MG PO CHEW
81.0000 mg | CHEWABLE_TABLET | Freq: Once | ORAL | Status: AC
  Administered 2019-10-22: 81 mg via ORAL
  Filled 2019-10-22: qty 1

## 2019-10-22 MED ORDER — FERROUS SULFATE 325 (65 FE) MG PO TABS
325.0000 mg | ORAL_TABLET | Freq: Every day | ORAL | Status: DC
  Administered 2019-10-23 – 2019-11-03 (×12): 325 mg via ORAL
  Filled 2019-10-22 (×12): qty 1

## 2019-10-22 NOTE — Progress Notes (Signed)
[place] PHYSICAL MEDICINE & REHABILITATION PROGRESS NOTE   Subjective/Complaints:   This AM, Pt reports chest pain has resolved- completely. Denied any issues at all this AM.  Got called overnight around 3am with pt having chest pain (location of chest was not made clear) rating 10/10- gave him NTG x2, then Morphine 2 mg x1- CP down to 5/10 and went to sleep, comfortably. Also gave pt 81 mg ASA since not on ASA- chewable.   Also during this time, because was told there were ST changes on EKG (which won't load in computer), and Troponin was 50 (in setting of Cr of 5.2 which likely makes it appear higher than actually is), called Cardiology- thank you for Dr. Antionette Char assistance.  He noted that chest pain was midsternal to R side, and likely wasn't cardiac due to reviewed the full chart and EKG sent to him by nursing. .  F/U Troponin was 38-  Pt had more chest pain at 0530 and was given another round of NTG x3 and Morphine x3 and pt was feeling better- went back to sleep.     ROS: Patient denies fever, rash, sore throat, blurred vision, nausea, vomiting, diarrhea, cough, shortness of breath or chest pain,  headache, or mood change. .   Objective:   No results found. No results for input(s): WBC, HGB, HCT, PLT in the last 72 hours. Recent Labs    10/21/19 1211 10/22/19 0101  NA 130* 128*  K 5.1 5.4*  CL 95* 96*  CO2 20* 20*  GLUCOSE 233* 166*  BUN 91* 95*  CREATININE 5.26* 5.28*  CALCIUM 8.3* 7.5*    Intake/Output Summary (Last 24 hours) at 10/22/2019 0950 Last data filed at 10/22/2019 [id number] Gross per 24 hour  Intake 1032 ml  Output 3200 ml  Net -2168 ml     Physical Exam: Vital Signs Blood pressure (!) 175/86, pulse 80, temperature 97.8 F (36.6 C), temperature source Oral, resp. rate 16, height 5\' 6"  (1.676 m), weight 84.5 kg, SpO2 96 %. Constitutional: No distress . Vital signs and labs reviewed.Laying on side in bed; appears comfortable; watching TV, NAD HEENT:  EOMI, oral membranes moist Neck: supple Cardiovascular: RRR without murmur.     Respiratory: CTA Bilaterally without wheezes or rales. Normal effort    GI: BS +, non-tender, non-distended  Musculoskeletal:     Comments: No edema or tenderness in extremities  Neurological: He is alert.  Oriented to place person. Fair insight. STM deficits present.  Motor: Bilateral upper extremities: 4+/5 proximal distal right lower extremity: Hip flexion, knee extension 4/5, ankle dorsiflexion 4+/5 Left lower extremity: Hip flexion, knee extension 4 -/5, ankle dorsiflexion 4+/5--motor exam stable. Sensation diminished to light touch bilateral feet  Skin:  Eschar, dried blood on right 1st MT head. Difficult to assess depth d/t appearance. Scattered abrasions on other limbs, muliple tats Psychiatric: very pleasant    Assessment/Plan: 1. Functional deficits secondary to encephalopathy, polytrauma which require 3+ hours per day of interdisciplinary therapy in a comprehensive inpatient rehab setting.  Physiatrist is providing close team supervision and 24 hour management of active medical problems listed below.  Physiatrist and rehab team continue to assess barriers to discharge/monitor patient progress toward functional and medical goals  Care Tool:  Bathing    Body parts bathed by patient: Right arm, Left arm, Chest, Abdomen, Front perineal area, Right upper leg, Left upper leg, Right lower leg, Left lower leg, Face, Buttocks   Body parts bathed by helper: Buttocks  Bathing assist Assist Level: Supervision/Verbal cueing     Upper Body Dressing/Undressing Upper body dressing   What is the patient wearing?: Pull over shirt    Upper body assist Assist Level: Supervision/Verbal cueing    Lower Body Dressing/Undressing Lower body dressing      What is the patient wearing?: Pants     Lower body assist Assist for lower body dressing: Minimal Assistance - Patient > 75%      Toileting Toileting    Toileting assist Assist for toileting: Supervision/Verbal cueing Assistive Device Comment: urinal   Transfers Chair/bed transfer  Transfers assist     Chair/bed transfer assist level: Contact Guard/Touching assist     Locomotion Ambulation   Ambulation assist      Assist level: Minimal Assistance - Patient > 75% Assistive device: Walker-rolling Max distance: 150'   Walk 10 feet activity   Assist     Assist level: Minimal Assistance - Patient > 75% Assistive device: Walker-rolling   Walk 50 feet activity   Assist    Assist level: Minimal Assistance - Patient > 75% Assistive device: Walker-rolling    Walk 150 feet activity   Assist    Assist level: Minimal Assistance - Patient > 75% Assistive device: Walker-rolling    Walk 10 feet on uneven surface  activity   Assist     Assist level: Moderate Assistance - Patient - 50 - 74% Assistive device: Aeronautical engineer Will patient use wheelchair at discharge?: No             Wheelchair 50 feet with 2 turns activity    Assist            Wheelchair 150 feet activity     Assist          Blood pressure (!) 175/86, pulse 80, temperature 97.8 F (36.6 C), temperature source Oral, resp. rate 16, height 5\' 6"  (1.676 m), weight 84.5 kg, SpO2 96 %.  Medical Problem List and Plan: 1.  Debility/encephalopathy secondary to polytrauma.             -patient may not shower             -ELOS/Goals: 12-16 days/supervision.             -continue CIR therapies including PT and OT and SLP  -will order darco shoe for right foot to off load 1st MT head  -discussed expected course of recovery, fatigue, etc with pt 2.  Antithrombotics: -DVT/anticoagulation:  SQ Heparin             -antiplatelet therapy: N/A 3. Pain Management/Chronic back pain: Neurontin 300 mg nightly, trazodone 200 mg nightly oxycodone as needed 4. Mood:/Dementia With  history of bipolar disorder.  Effexor 225 mg daily             -antipsychotic agents: Zyprexa 5 mg nightly  -mood seems controlled and is sleeping at present 5. Neuropsych: This patient is capable of making decisions on his own behalf. 6. Skin/Wound Care:  2/5 -will change to santyl dressing for right 1st MT areas of eschar/callus   -darco shoe ordered to off load forefoot 7. Fluids/Electrolytes/Nutrition: Routine in and outs.  CMP ordered. 8.  Acute on chronic renal failure stage IV.  Baseline creatinine 2.2-2.7.  Follow-up renal services.  Continue Lasix 80 mg twice daily.  No current plan for hemodialysis              2/5: Cr holding  at 5.23 today--has been steadily rising   -nephrology following, decreased lasix, lokelma started   -plan was for vein mapping in prep of AVF   -U/O 1350cc yesterday  2/6- Cr 5.26; BUN 91- holding steady  2/7- Cr 5.28 and BUN up slightly to 95; K+ up slightly to 5.4- per nephrology.  9.  Rhabdomyolysis.  CK trending down. 10.  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  Hemoglobin A1c 8.5.  Lantus insulin 15 units daily.  Check blood sugars before meals and at bedtime.  Diabetic teaching             2/5 sugars remain elevate.   -increase lantus to 18u daily.  -cover sith SSI   CBG (last 3)  Recent Labs    10/21/19 1641 10/21/19 2118 10/22/19 0639  GLUCAP 188* 198* 153*   2/7- BGs 152-190s- will con't regimen  11. Hypertension.Norvasc 10 mg daily,Hydralazine 25 mg every 8 hours             2/6 controlled; con't regimen 12.  Combined systolic and diastolic congestive heart failure.  Monitor for any signs of fluid overload.  Continue diuresis             2/5 Daily weights needed. None today  2/7- Weight very stable- con't regimen   Filed Weights   10/19/19 0341 10/21/19 0348 10/22/19 0643  Weight: 85.2 kg 85.1 kg 84.5 kg    13.  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  Continue Prevalon boot.  Follow-up outpatient Dr. Sharol Given to plan for debridement.  No surgical intervention necessary at this time  -see above #6 14.  Left pleural effusion with history of COPD.  Continue diuresis.  May need thoracentesis.  Follow-up chest x-ray as needed  IS, OOB 15.  Destructive process involving sternum suspect old fracture versus infection.  Latest blood cultures no growth to date.  Radiologist reviewed likely chronic smoldering process.  Follow-up outpatient CVTS.  -rx pain as needed, appears controlled  2/7- could be cause of "chest pain"- will have nursing check if reproducible.  16.  T3 as well as T1 and T2 right transverse process fractures.  Conservative care per Dr. Ashok Pall 17.  Right scapular fracture.  Weightbearing as tolerated 18.  Bilateral rib fractures.  Conservative care. 19.  History of polysubstance abuse tobacco and alcohol abuse.  Urine drug screen negative.  Provide counseling 20.  Medical noncompliance.  Provide counseling 21. Midsternal to r sided chest pain  2/7- occurred multiple times overnight- was given NTG x2 ; Morphine 2 mg x1 and ASA 81 mg- labs ordered; troponin 50 then 38- Cardiologist didn't feel was ACS and just to follow troponin and Sx's; did once again at 5:30 am however it was not relayed to team pt has sternal issues fracture vs infection that hadn't been brought up- might be the cause of pain- will reassess as required. Will order f/u CXR on previous pleural effusion to complete work up.    I spent a total of 1 hour on total care of patient on 10/22/2019. Including calling cardiology, nursing calls, and follow up on reviewing labs.   LOS: 4 days A FACE TO FACE EVALUATION WAS PERFORMED  Jereme Loren 10/22/2019, 9:50 AM

## 2019-10-22 NOTE — Progress Notes (Signed)
Physical Therapy Session Note  Patient Details  Name: Barry Lewis MRN: [id number] Date of Birth: 1960-11-26  Today's Date: 10/22/2019 PT Individual Time: 1102-1157 PT Individual Time Calculation (min): 55 min   Short Term Goals: Week 1:  PT Short Term Goal 1 (Week 1): Pt will demonstrate wc to/from bed/mat w/supervision and safe use of LRAD PT Short Term Goal 2 (Week 1): Gait 277ft w/LRAD and cga PT Short Term Goal 3 (Week 1): ascend/descent 3 stairs w/single rail and cga assist  Skilled Therapeutic Interventions/Progress Updates:  Pt cleared to participate with therapy as tolerated. Pt transferred supine to edge of bed with side rail and c/g. Pt able to sit on edge of bed with S. Pt assisted with donning and doffing scrub top and bottom. Pt performed multiple sit to stand and stand pivot transfers with rolling walker and R darco shoe, S to c.g with verbal cues. Pt ambulated 80 feet x 2 and 150 feet with rolling walker and S to c.g. Pt left sitting up in recliner with chair alarm in place and call bell within reach at end of session.   Therapy Documentation Precautions:  Precautions Precautions: Fall, Back Precaution Booklet Issued: No Precaution Comments: Reviewed log roll for comfort due to fracture Required Braces or Orthoses: (md note recommends Prevalon boot) Restrictions Weight Bearing Restrictions: No LUE Weight Bearing: Weight bearing as tolerated RLE Weight Bearing: Weight bearing as tolerated Other Position/Activity Restrictions: Actor for wbing General:   Vital Signs: Therapy Vitals Pulse Rate: 88 BP: (!) 171/94 Oxygen Therapy SpO2: 97 % Pain:   Mobility:   Locomotion :    Trunk/Postural Assessment :    Balance:   Exercises:   Other Treatments:      Therapy/Group: Individual Therapy  Dub Amis 10/22/2019, 12:03 PM

## 2019-10-22 NOTE — Progress Notes (Signed)
Pt complained of 8/10 aching on right ribs. Rapid response notified and received order to retrieve EKG and administer Nitro. Pain at first dose of nitro was 8/10 bp- 158/91. Last dose of nitro pain 7/10 BP- 123/68. MD on call [place] notified, Morphine 2mg  and 81 mg of Asprin ordered. Troponin, CK, CKMB labs ordered. Dr. Dagoberto Ligas notified of results and consulted with Cardiology. Dr. Burt Knack called and ordered a repeat cardiac enzyme for 6 am. Pt is sleeping and vitals are stable at this time.

## 2019-10-22 NOTE — Significant Event (Signed)
At [place], assisted Whitney,LPN with assessment. Patient complained of severe mid sternal pain, radiating to left flank. Patient in obvious discomfort, rating pain a 10 on pain scale. Paged Mindy with rapid response-NTG SL given X 3. Pain 5, after 3rd dose of NTG. EKG results given to Dr. Dagoberto Ligas. Labs ordered. ASA 81mg 's and morphine given at 0123. Resting comfortably after above interventions. Patrici Ranks A

## 2019-10-22 NOTE — Progress Notes (Signed)
STEMI MD Note: Called by Dr Dagoberto Ligas because this patient developed chest pain tonight. He is in CIR for debilitation, encephalopathy after polytrauma. Also noted to have acute on chronic renal failure with creatinine >5. Called and discussed with patient's RN. He had right sided CP, now resolved. Otherwise was in no distress. EKG shows NSR, PVC, first degree AV block, nonspecific EKG changes. HS-trop 50, CK 60, MB 8.4. Total CK trending down (pt has had rhabdo). RN reports he is no resting comfortably with no complaint.   Clinical syndrome unlikely to be ACS, and EKG shows no acute ischemic changes. Recommend repeat troponin at 0600 to assess delta troponin. Otherwise no specific treatment recommended.   Sherren Mocha 10/22/2019 3:40 AM

## 2019-10-22 NOTE — Progress Notes (Signed)
Bilateral upper extremity venous mapping complete.  Please see CV Proc tab for preliminary results. Lita Mains- RDMS, RVT 3:06 PM  10/22/2019

## 2019-10-22 NOTE — Progress Notes (Signed)
[facility] Progress Note    Assessment/ Plan:   1. AoCKD IV: Cr stable at 5.28 today, (baseline 2.2-2.7). BUN 95. Work up thus far notable for nephrotic syndrome suspecting 2/2 diabetic nephropathy. Further workup with Serologies pending. Does continue to have good UOP with lasix, despite kidney function remaining stable (3.7L UOP overnight). Vein mapping scheduled. Na 128, K 5.4.  - continue Lasix 80mg  PO QD - vein mapping today - consider initiation of lokelma 10mg  QD - daily renal function panel - Sodium Bicarb  2. Chronic Hyponatremia: Hypervolemia hyponatremia 2/2 renal insufficiency: Na 130>128 today.   - continue to monitor - continue Lasix - continue fluid restriction (1.5L restriction)  3. Hypertension: Blood pressure all over the place overnight (120-170's / 60-100's) - continue Hydral 25mg  q8h and Norvasc 10mg  QD, consider increasing dose of Hydral  - cont to hold ACEi  4. Hyperkalemia: K 5.4 today.  - consider Lokelma if continues to increase  5. Anemia of CKD: Iron 65, TIBC 252, Sat 26, Ferritin 345. PTH levels pending - start iron supplement  7. IDDM: on insulin, per priamry  8. 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  9. Hepatitis C Ab positive: will obtain Hep C RNA for further work up  Subjective:   Patient resting comfortably in exam bed. Chest pain over night with unremarkable work up. Denies any concerns or complaints at this time.   Objective:   BP (!) 175/86 (BP Location: Left Arm)   Pulse 80   Temp 97.8 F (36.6 C) (Oral)   Resp 16   Ht 5\' 6"  (1.676 m)   Wt 84.5 kg   SpO2 96%   BMI 30.07 kg/m   Intake/Output Summary (Last 24 hours) at 10/22/2019 1044 Last data filed at 10/22/2019 [id number] Gross per 24 hour  Intake 1272 ml  Output 2925 ml  Net -1653 ml   Weight change: -0.6 kg  Physical Exam: General: resting comfortably in exam bed, in no acute distress with non-toxic appearance CV: 1/6 systolic  murmur appreciated on exam, regular rate and rhythm, 2+ pitting edema to shins bilaterally Lungs: clear to auscultation bilaterally with normal work of breathing on RA Abdomen: soft, non-tender Skin: warm, dry Extremities: warm and well perfused  Imaging: No results found.  Labs: BMET Recent Labs  Lab 10/16/19 0215 10/16/19 0215 10/17/19 0355 10/18/19 0520 10/19/19 0521 10/20/19 0955 10/20/19 1934 10/21/19 1211 10/22/19 0101  NA 132*   < > 136 134* 132* 129* 130* 130* 128*  K 4.8   < > 4.7 4.9 5.2* 5.1 5.2* 5.1 5.4*  CL 102   < > 102 98 99 94* 96* 95* 96*  CO2 18*   < > 21* 21* 22 21* 22 20* 20*  GLUCOSE 177*   < > 144* 214* 161* 240* 128* 233* 166*  BUN 56*   < > 61* 68* 75* 82* 90* 91* 95*  CREATININE 4.93*   < > 4.92* 4.99* 5.28* 5.23* 5.53* 5.26* 5.28*  CALCIUM 7.1*   < > 7.2* 7.4* 7.7* 8.0* 8.0* 8.3* 7.5*  PHOS 6.3*  --  6.2* 5.6*  --  6.6*  --  6.7* 6.6*   < > = values in this interval not displayed.   CBC Recent Labs  Lab 10/17/19 0759 10/18/19 0520 10/19/19 0521  WBC 5.6 5.2 5.4  NEUTROABS  --   --  3.7  HGB 11.4* 10.1* 10.2*  HCT 35.0* 31.0* 31.3*  MCV 98.9 99.0 98.4  PLT 235 232 260    Medications:    . amLODipine  10 mg Oral Daily  . collagenase   Topical Daily  . docusate sodium  100 mg Oral BID  . feeding supplement (ENSURE ENLIVE)  237 mL Oral BID BM  . [START ON 10/23/2019] ferrous sulfate  325 mg Oral Q breakfast  . fluticasone  1 spray Each Nare Daily  . folic acid  1 mg Oral Daily  . furosemide  80 mg Oral Daily  . gabapentin  300 mg Oral QHS  . heparin  5,000 Units Subcutaneous Q8H  . hydrALAZINE  25 mg Oral Q8H  . hydrocerin   Topical BID  . insulin aspart  0-15 Units Subcutaneous TID WC  . insulin glargine  15 Units Subcutaneous Daily  . OLANZapine  5 mg Oral QHS  . sodium bicarbonate  1,300 mg Oral BID  . sodium zirconium cyclosilicate  10 g Oral Daily  . thiamine  100 mg Oral Daily  . traZODone  200 mg Oral QHS  . venlafaxine XR   225 mg Oral Q breakfast      Mina Marble, DO [facility], PGY2 10/22/2019, 10:44 AM

## 2019-10-22 NOTE — Significant Event (Signed)
At 0440, patient complained of mid sternal chest pain, radiating to right side of chest. Rating pain 8 on pain scale. Patient's nurse Whitney paged Dr. Burt Knack, with orders to repeat NTG X [id number], vitals under flowsheets and give morphine 2mg 's IV, given at 0449. Pain level 5, after above interventions. Will continue to monitor. Barry Lewis A

## 2019-10-22 NOTE — Significant Event (Signed)
Rapid Response Event Note  Overview: Called d/t pt c/o mid-sternal chest pain radiating to L side and back, 10/10 per pt, BP-158/91 Asked RN to get EKG and given ntg sl. Time Called: 0047 Arrival Time: 0115(RRT in emergency, gave RN orders over the phone, pt seen at [place]) Event Type: Cardiac  Initial Focused Assessment: Pt laying in bed in no obvious distress, alert and oriented, c/o 6/10 epigastric pain. He says this pain feels different than when he had his heart attack. Pt denies SOB. Lungs clear t/o. T-97.7, HR-86, BP-133/76, RR-20, SpO2-97% on RA.   Interventions: EKG-SR with 1st degree heart block NTG SL x 3-given PTA RRT @ 0052, 0057, and 0103 2mg  morphine given @ 0123 81mg  ASA @ 0123 Trop, CK-MB Plan of Care (if not transferred): Pain is "better" per pt but he is still c/o 5/10 epigastric pain. Continue to monitor. Await lab results and notify MD of abnormalities. Call RRT if further assistance needed.  Event Summary: Name of Physician Notified: [place], MD at (done by bedside RN)    at    Outcome: Stayed in room and stabalized  Event End Time: 0128  Dillard Essex

## 2019-10-22 NOTE — Progress Notes (Signed)
Occupational Therapy Session Note  Patient Details  Name: Barry Lewis MRN: [id number] Date of Birth: July 28, 1961  Today's Date: 10/22/2019 OT Individual Time: 5790-3833 OT Individual Time Calculation (min): 10 min  and Today's Date: 10/22/2019 OT Missed Time: 35 Minutes Missed Time Reason: Patient fatigue   Session 2:  OT Individual Time: 1500-1530 OT Individual Time Calculation (min): 30 min   Short Term Goals: Week 1:  OT Short Term Goal 1 (Week 1): Pt will complete toilet transfer/toietling wiht S OT Short Term Goal 2 (Week 1): Pt will don B socks wiht AE PRN OT Short Term Goal 3 (Week 1): pt will thread BLE into pants with S OT Short Term Goal 4 (Week 1): Pt will groom in standing with S  Skilled Therapeutic Interventions/Progress Updates:    pt received supine, initially easily awoken. Pt conversing with therapist and talking re plan for therapy today. Pt very quickly fell back asleep and then struggled to remain alert. Vitals assessed- BP 144/77, HR 75, and Spo2 95%. Discussed prior night's events with [facility] RN. Decided to allow pt to rest, considering elevated troponin. Pt left supine with all needs met, 35 min missed.   Session 2: Pt received supine with no c/o pain, much more alert from earlier. Pt agreeable to session. Pt completed bed mobility to EOB with (S). Pt completed 100 ft of functional mobility with RW, CGA. Pt encouraged to take more breaks this session 2/2 cardiac events of last night, with an increased attention to vitals. All stable throughout session. Pt completed 3x 5 sit <> stand from EOM with a 3lb dowel, to challenge functional activity tolerance and standing balance without RW support. Pt completed 1 set of 10 functional stepping activity without RW support- requiring min-mod fluctuating assist for standing balance support. Pt returned to his room and was left sitting up in the recliner with all needs met. Chair alarm set.   Therapy Documentation Precautions:   Precautions Precautions: Fall, Back Precaution Booklet Issued: No Precaution Comments: Reviewed log roll for comfort due to fracture Required Braces or Orthoses: (md note recommends Prevalon boot) Restrictions Weight Bearing Restrictions: No LUE Weight Bearing: Weight bearing as tolerated RLE Weight Bearing: Weight bearing as tolerated Other Position/Activity Restrictions: Prevalon Boot for wbing   Therapy/Group: Individual Therapy  Curtis Sites 10/22/2019, 7:15 AM

## 2019-10-23 ENCOUNTER — Encounter (HOSPITAL_COMMUNITY): Payer: Medicare Other | Admitting: Psychology

## 2019-10-23 ENCOUNTER — Inpatient Hospital Stay (HOSPITAL_COMMUNITY): Payer: Medicare Other

## 2019-10-23 ENCOUNTER — Inpatient Hospital Stay (HOSPITAL_COMMUNITY): Payer: Medicare Other | Admitting: Physical Therapy

## 2019-10-23 ENCOUNTER — Inpatient Hospital Stay (HOSPITAL_COMMUNITY): Payer: Medicare Other | Admitting: Speech Pathology

## 2019-10-23 LAB — RENAL FUNCTION PANEL
Albumin: 1.9 g/dL — ABNORMAL LOW (ref 3.5–5.0)
Anion gap: 15 (ref 5–15)
BUN: 99 mg/dL — ABNORMAL HIGH (ref 6–20)
CO2: 25 mmol/L (ref 22–32)
Calcium: 8.6 mg/dL — ABNORMAL LOW (ref 8.9–10.3)
Chloride: 95 mmol/L — ABNORMAL LOW (ref 98–111)
Creatinine, Ser: 5.3 mg/dL — ABNORMAL HIGH (ref 0.61–1.24)
GFR calc Af Amer: 13 mL/min — ABNORMAL LOW (ref >60)
GFR calc non Af Amer: 11 mL/min — ABNORMAL LOW (ref >60)
Glucose, Bld: 135 mg/dL — ABNORMAL HIGH (ref 70–99)
Phosphorus: 6.4 mg/dL — ABNORMAL HIGH (ref 2.5–4.6)
Potassium: 4.7 mmol/L (ref 3.5–5.1)
Sodium: 135 mmol/L (ref 135–145)

## 2019-10-23 LAB — GLUCOSE, CAPILLARY
Glucose-Capillary: 119 mg/dL — ABNORMAL HIGH (ref 70–99)
Glucose-Capillary: 278 mg/dL — ABNORMAL HIGH (ref 70–99)
Glucose-Capillary: 285 mg/dL — ABNORMAL HIGH (ref 70–99)
Glucose-Capillary: 179 mg/dL — ABNORMAL HIGH (ref 70–99)

## 2019-10-23 LAB — KAPPA/LAMBDA LIGHT CHAINS
Kappa free light chain: 238.5 mg/L — ABNORMAL HIGH (ref 3.3–19.4)
Kappa, lambda light chain ratio: 1.48 (ref 0.26–1.65)
Lambda free light chains: 161.3 mg/L — ABNORMAL HIGH (ref 5.7–26.3)

## 2019-10-23 MED ORDER — HYDRALAZINE HCL 50 MG PO TABS
50.0000 mg | ORAL_TABLET | Freq: Three times a day (TID) | ORAL | Status: DC
  Administered 2019-10-23 – 2019-10-25 (×6): 50 mg via ORAL
  Filled 2019-10-23 (×6): qty 1

## 2019-10-23 MED ORDER — SENNOSIDES-DOCUSATE SODIUM 8.6-50 MG PO TABS: 1.0000 | ORAL_TABLET | Freq: Two times a day (BID) | ORAL | Status: DC

## 2019-10-23 MED ORDER — SEVELAMER CARBONATE 800 MG PO TABS
1600.0000 mg | ORAL_TABLET | Freq: Three times a day (TID) | ORAL | Status: DC
  Administered 2019-10-23 – 2019-10-27 (×12): 1600 mg via ORAL
  Filled 2019-10-23 (×12): qty 2

## 2019-10-23 MED ORDER — POLYETHYLENE GLYCOL 3350 17 G PO PACK
17.0000 g | PACK | Freq: Every day | ORAL | Status: DC
  Administered 2019-10-23 – 2019-11-02 (×8): 17 g via ORAL
  Filled 2019-10-23 (×12): qty 1

## 2019-10-23 MED ORDER — INSULIN GLARGINE 100 UNIT/ML ~~LOC~~ SOLN
18.0000 [IU] | Freq: Every day | SUBCUTANEOUS | Status: DC
  Administered 2019-10-24 – 2019-11-03 (×11): 18 [IU] via SUBCUTANEOUS
  Filled 2019-10-23 (×11): qty 0.18

## 2019-10-23 NOTE — Progress Notes (Signed)
SLP Cancellation Note  Patient Details Name: Barry Lewis MRN: [id number] DOB: [birth year]   Cancelled treatment:       Patient missed 60 minutes of skilled SLP intervention due to feeling ill. Patient reported feeling nauseous and dizzy, PA aware and addressing. Continue with current plan of care.                                                                                                 Elyssa Pendelton 10/23/2019, 2:28 PM

## 2019-10-23 NOTE — Plan of Care (Signed)
  Problem: Consults Goal: RH GENERAL PATIENT EDUCATION Description: See Patient Education module for education specifics. Outcome: Progressing Goal: Skin Care Protocol Initiated - if Braden Score 18 or less Description: If consults are not indicated, leave blank or document N/A Outcome: Progressing Goal: Diabetes Guidelines if Diabetic/Glucose > 140 Description: If diabetic or lab glucose is > 140 mg/dl - Initiate Diabetes/Hyperglycemia Guidelines & Document Interventions  Outcome: Progressing   Problem: RH SKIN INTEGRITY Goal: RH STG SKIN FREE OF INFECTION/BREAKDOWN Description: Patients skin will remain free from further infection or breakdown with mod I assist. Outcome: Progressing Goal: RH STG MAINTAIN SKIN INTEGRITY WITH ASSISTANCE Description: STG Maintain Skin Integrity With mod I Assistance. Outcome: Progressing Goal: RH STG ABLE TO PERFORM INCISION/WOUND CARE W/ASSISTANCE Description: STG Able To Perform Incision/Wound Care With mod I Assistance. Outcome: Progressing   Problem: RH SAFETY Goal: RH STG ADHERE TO SAFETY PRECAUTIONS W/ASSISTANCE/DEVICE Description: STG Adhere to Safety Precautions With supervision/cues Assistance/Device. Outcome: Progressing

## 2019-10-23 NOTE — Progress Notes (Signed)
[facility] PHYSICAL MEDICINE & REHABILITATION PROGRESS NOTE   Subjective/Complaints:   Pt with chest pain over the weekend. Non-cardiac/musculoskeletal (had had sternal pain leading up to this admit). Feels well this morning.    ROS: Patient denies fever, rash, sore throat, blurred vision, nausea, vomiting, diarrhea, cough, shortness of breath or chest pain, joint or back pain, headache, or mood change. .   Objective:   DG CHEST PORT 1 VIEW  Result Date: 10/22/2019 CLINICAL DATA:  Cough and pleural effusion. EXAM: PORTABLE CHEST 1 VIEW COMPARISON:  10/16/2019 FINDINGS: 1114 hours. Asymmetric elevation left hemidiaphragm, stable. Streaky opacity in the left mid lung and left base is similar to prior with substantial overlying deformity of numerous left ribs compatible with old trauma. Right lung is better aerated than on the previous study. Cardiopericardial silhouette is at upper limits of normal for size. IMPRESSION: Interval improvement in right basilar aeration with persistent streaky parenchymal opacity in the left lung and pleuroparenchymal changes likely related to prior left chest wall trauma. No substantial pleural effusion evident on today's study. Electronically Signed   By: Misty Stanley M.D.   On: 10/22/2019 13:11   VAS [place] UPPER EXT VEIN MAPPING (PRE-OP AVF)  Result Date: 10/23/2019 UPPER EXTREMITY VEIN MAPPING  Indications: Pre-access. Performing Technologist: Antonieta Pert RDMS, RVT  Examination Guidelines: A complete evaluation includes B-mode imaging, spectral Doppler, color Doppler, and power Doppler as needed of all accessible portions of each vessel. Bilateral testing is considered an integral part of a complete examination. Limited examinations for reoccurring indications may be performed as noted. +-----------------+-------------+----------+--------------------+ Right Cephalic   Diameter (cm)Depth (cm)      Findings        +-----------------+-------------+----------+--------------------+ Shoulder             0.23        1.18                        +-----------------+-------------+----------+--------------------+ Prox upper arm       0.19        1.03                        +-----------------+-------------+----------+--------------------+ Mid upper arm        0.14        0.65                        +-----------------+-------------+----------+--------------------+ Dist upper arm                          chronically occluded +-----------------+-------------+----------+--------------------+ Antecubital fossa    0.10        0.54                        +-----------------+-------------+----------+--------------------+ Prox forearm                               not visualized    +-----------------+-------------+----------+--------------------+ Mid forearm          0.11        0.51                        +-----------------+-------------+----------+--------------------+ Dist forearm         0.09        0.41                        +-----------------+-------------+----------+--------------------+  Wrist                                      not visualized    +-----------------+-------------+----------+--------------------+ +-----------------+-------------+----------+--------------+ Right Basilic    Diameter (cm)Depth (cm)   Findings    +-----------------+-------------+----------+--------------+ Prox upper arm       0.30        1.01                  +-----------------+-------------+----------+--------------+ Mid upper arm        0.26        0.78                  +-----------------+-------------+----------+--------------+ Dist upper arm       0.24        0.75                  +-----------------+-------------+----------+--------------+ Antecubital fossa    0.24        0.67     branching    +-----------------+-------------+----------+--------------+ Prox forearm         0.24         0.53                  +-----------------+-------------+----------+--------------+ Mid forearm          0.11        0.29                  +-----------------+-------------+----------+--------------+ Distal forearm                          not visualized +-----------------+-------------+----------+--------------+ +-----------------+-------------+----------+-----------------------------------+ Left Cephalic    Diameter (cm)Depth (cm)             Findings               +-----------------+-------------+----------+-----------------------------------+ Shoulder             0.19        1.54                                       +-----------------+-------------+----------+-----------------------------------+ Prox upper arm       0.19        0.93                                       +-----------------+-------------+----------+-----------------------------------+ Mid upper arm        0.19        0.49                                       +-----------------+-------------+----------+-----------------------------------+ Dist upper arm       0.23        0.37   branch has tiny segment of chronic                                                      thrombosis              +-----------------+-------------+----------+-----------------------------------+  Antecubital fossa    0.38        0.39                branching              +-----------------+-------------+----------+-----------------------------------+ Prox forearm         0.20        0.72                                       +-----------------+-------------+----------+-----------------------------------+ Mid forearm          0.13        0.57                                       +-----------------+-------------+----------+-----------------------------------+ Dist forearm         0.12        0.51                                       +-----------------+-------------+----------+-----------------------------------+  +-----------------+-------------+----------+--------------+ Left Basilic     Diameter (cm)Depth (cm)   Findings    +-----------------+-------------+----------+--------------+ Shoulder             0.42        0.97                  +-----------------+-------------+----------+--------------+ Prox upper arm       0.31        1.07     branching    +-----------------+-------------+----------+--------------+ Mid upper arm        0.27        0.99     branching    +-----------------+-------------+----------+--------------+ Dist upper arm       0.34        0.77                  +-----------------+-------------+----------+--------------+ Antecubital fossa    0.32        0.66                  +-----------------+-------------+----------+--------------+ Prox forearm         0.27        0.57                  +-----------------+-------------+----------+--------------+ Mid forearm          0.16        0.50                  +-----------------+-------------+----------+--------------+ Distal forearm                          not visualized +-----------------+-------------+----------+--------------+ *See table(s) above for measurements and observations.  Diagnosing physician: Monica Martinez MD Electronically signed by Monica Martinez MD on 10/23/2019 at 9:23:10 AM.    Final    No results for input(s): WBC, HGB, HCT, PLT in the last 72 hours. Recent Labs    10/22/19 0101 10/23/19 0450  NA 128* 135  K 5.4* 4.7  CL 96* 95*  CO2 20* 25  GLUCOSE 166* 135*  BUN 95* 99*  CREATININE 5.28* 5.30*  CALCIUM 7.5* 8.6*    Intake/Output Summary (Last 24 hours) at 10/23/2019  [place] filed at 10/23/2019 1000 Gross per 24 hour  Intake 1017 ml  Output 2175 ml  Net -1158 ml     Physical Exam: Vital Signs Blood pressure (!) 152/88, pulse 83, temperature 97.9 F (36.6 C), resp. rate 18, height 5\' 6"  (1.676 m), weight 84.9 kg, SpO2 95 %. Constitutional: No distress . Vital signs  reviewed. HEENT: EOMI, oral membranes moist Neck: supple Cardiovascular: RRR without murmur. No JVD    Respiratory: CTA Bilaterally without wheezes or rales. Normal effort, some tenderness along the chest wall   GI: BS +, non-tender, non-distended  Musculoskeletal:     Comments: No edema or tenderness in extremities  Neurological: He is alert.  Oriented to place person. Fair insight. STM deficits present.  Motor: Bilateral upper extremities: 4+/5 proximal distal right lower extremity: Hip flexion, knee extension 4/5, ankle dorsiflexion 4+/5 Left lower extremity: Hip flexion, knee extension 4 -/5, ankle dorsiflexion 4+/5--motor exam stable. Sensation diminished to light touch bilateral feet  Skin:  Eschar, dried blood on right first MT--really no change. No dressing on wound Psychiatric: generally very pleasant    Assessment/Plan: 1. Functional deficits secondary to encephalopathy, polytrauma which require 3+ hours per day of interdisciplinary therapy in a comprehensive inpatient rehab setting.  Physiatrist is providing close team supervision and 24 hour management of active medical problems listed below.  Physiatrist and rehab team continue to assess barriers to discharge/monitor patient progress toward functional and medical goals  Care Tool:  Bathing    Body parts bathed by patient: Right arm, Left arm, Chest, Abdomen, Front perineal area, Right upper leg, Left upper leg, Right lower leg, Left lower leg, Face, Buttocks   Body parts bathed by helper: Buttocks     Bathing assist Assist Level: Supervision/Verbal cueing     Upper Body Dressing/Undressing Upper body dressing   What is the patient wearing?: Pull over shirt    Upper body assist Assist Level: Supervision/Verbal cueing    Lower Body Dressing/Undressing Lower body dressing      What is the patient wearing?: Pants     Lower body assist Assist for lower body dressing: Contact Guard/Touching assist      Toileting Toileting    Toileting assist Assist for toileting: Supervision/Verbal cueing Assistive Device Comment: urinal   Transfers Chair/bed transfer  Transfers assist     Chair/bed transfer assist level: Contact Guard/Touching assist     Locomotion Ambulation   Ambulation assist   Ambulation activity did not occur: Safety/medical concerns(used cane PTA, unsafe to attempt)  Assist level: Contact Guard/Touching assist Assistive device: Walker-rolling Max distance: 150'   Walk 10 feet activity   Assist  Walk 10 feet activity did not occur: Safety/medical concerns(unsafe to attempt w/cane)  Assist level: Contact Guard/Touching assist Assistive device: Walker-rolling   Walk 50 feet activity   Assist Walk 50 feet with 2 turns activity did not occur: Safety/medical concerns  Assist level: Contact Guard/Touching assist Assistive device: Walker-rolling    Walk 150 feet activity   Assist Walk 150 feet activity did not occur: Safety/medical concerns  Assist level: Contact Guard/Touching assist Assistive device: Walker-rolling    Walk 10 feet on uneven surface  activity   Assist Walk 10 feet on uneven surfaces activity did not occur: Safety/medical concerns         Wheelchair     Assist Will patient use wheelchair at discharge?: No             Wheelchair 50 feet with 2 turns  activity    Assist            Wheelchair 150 feet activity     Assist          Blood pressure (!) 152/88, pulse 83, temperature 97.9 F (36.6 C), resp. rate 18, height 5\' 6"  (1.676 m), weight 84.9 kg, SpO2 95 %.  Medical Problem List and Plan: 1.  Debility/encephalopathy secondary to polytrauma.             -patient may not shower             -ELOS/Goals: 12-16 days/supervision.             -continue CIR therapies including PT and OT and SLP  -  darco shoe for right foot to off load 1st MT head  -?safety concerns at home are being discussed  amongst team 2.  Antithrombotics: -DVT/anticoagulation:  SQ Heparin             -antiplatelet therapy: N/A 3. Pain Management/Chronic back pain: Neurontin 300 mg nightly, trazodone 200 mg nightly oxycodone as needed 4. Mood:/Dementia With history of bipolar disorder.  Effexor 225 mg daily             -antipsychotic agents: Zyprexa 5 mg nightly  -mood seems controlled and is sleeping at present 5. Neuropsych: This patient is capable of making decisions on his own behalf. 6. Skin/Wound Care:  2/5 -will change to santyl dressing for right 1st MT areas of eschar/callus   -darco shoe ordered to off load forefoot  2/8 toe not dressed with santyl 7. Fluids/Electrolytes/Nutrition: Routine in and outs.  CMP ordered. 8.  Acute on chronic renal failure stage IV.  Baseline creatinine 2.2-2.7.  Follow-up renal services.  Continue Lasix 80 mg twice daily.  No current plan for hemodialysis              2/5: Cr holding at 5.23 today--has been steadily rising   -nephrology following, decreased lasix, lokelma started   -plan was for vein mapping in prep of AVF   -U/O 1350cc yesterday  2/6-8. Slow drift up of Cr 5.30,    Nephrology following. Some labs still pending   Appreciate their f/u   outpt f/u needed 9.  Rhabdomyolysis.  CK trending down. 10.  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  Hemoglobin A1c 8.5.  Lantus insulin 15 units daily.  Check blood sugars before meals and at bedtime.  Diabetic teaching             2/5 sugars remain elevated.   -increase lantus to 18u daily.  -cover sith SSI   CBG (last 3)  Recent Labs    10/22/19 1649 10/22/19 2103 10/23/19 0631  GLUCAP 205* 230* 119*   2/8 CBG's over 200  -increase lantus to 22u daily starting 2/9  11. Hypertension.Norvasc 10 mg daily,Hydralazine 25 mg every 8 hours             2/6 controlled; con't regimen 12.  Combined systolic and diastolic congestive heart failure.  Monitor for any signs of fluid overload.   Continue diuresis             2/5 Daily weights needed. None today  2/8- Weight very stable- con't regimen   Filed Weights   10/21/19 0348 10/22/19 0643 10/23/19 0500  Weight: 85.1 kg 84.5 kg 84.9 kg    13.  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  Continue  Prevalon boot.  Follow-up outpatient Dr. Sharol Given to plan for debridement.  No surgical intervention necessary at this time  -see above #6 14.  Left pleural effusion with history of COPD.  Continue diuresis.  May need thoracentesis.  Follow-up chest x-ray as needed  IS, OOB 15.  Destructive process involving sternum suspect old fracture versus infection.  Latest blood cultures no growth to date.  Radiologist reviewed likely chronic smoldering process.  Follow-up outpatient CVTS.  -rx pain as needed, appears controlled  2/8 likely cause of w/e chest pain.      Appreciate cardiology follow up 16.  T3 as well as T1 and T2 right transverse process fractures.  Conservative care per Dr. Ashok Pall 17.  Right scapular fracture.  Weightbearing as tolerated 18.  Bilateral rib fractures.  Conservative care. 19.  History of polysubstance abuse tobacco and alcohol abuse.  Urine drug screen negative.  Provide counseling 20.  Medical noncompliance.  Provide counseling     LOS: 5 days A FACE TO FACE EVALUATION WAS PERFORMED  Meredith Staggers 10/23/2019, 11:13 AM

## 2019-10-23 NOTE — Progress Notes (Signed)
Pt awoken from sleep due to right sided chest pain @0035 . Vitals taken and  were w/in normal parameters. Pt was given Nitro SL @0037 , [id number], [id number]. Pt states chest pain has decreased but pain level still at 7/10, due to fractures from fall prior to admission. On call provider notified of event. No new orders at this time. Pt given PRN Oxycodone for pain relief. Pt currently resting, will continue to monitor.
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
## 2019-10-23 NOTE — Progress Notes (Signed)
Physical Therapy Session Note  Patient Details  Name: Barry Lewis MRN: [id number] Date of Birth: 06-05-1961  Today's Date: 10/23/2019 PT Individual Time: [id number] PT Individual Time Calculation (min): 68 min   Short Term Goals: Week 1:  PT Short Term Goal 1 (Week 1): Pt will demonstrate wc to/from bed/mat w/supervision and safe use of LRAD PT Short Term Goal 2 (Week 1): Gait 235ft w/LRAD and cga PT Short Term Goal 3 (Week 1): ascend/descent 3 stairs w/single rail and cga assist  Skilled Therapeutic Interventions/Progress Updates:   Pt in recliner and agreeable to therapy, denies pain. Mod assist to don R darco shoe. Ambulated to/from therapy gym w/ CGA-close supervision w/ RW and increased time 2/2 energy demands w/ gait while wearing darco shoe. Worked on balance in therapy gym while standing to perform peg board task. Stood x3 min on foam surface w/ CGA and x3 min on firm surface w/ CGA. Increased sway on foam surface, verbal and tactile cues to maintain upright posture. Blocked practice of sit<>stands w/o UE support to work on functional strengthening and balance strategies, performed 2x5 reps. Performed NuStep 5 min x2 @ level 4 w/ all extremities to work on strengthening and endurance.   Frequent rest breaks throughout session 2/2 increase in fatigue and moderate increase in work of breathing w/ all activity. Needed to use toilet 1/2 way through session, ambulated to/from restroom near therapy gym, toilet transfer w/ CGA.   Ambulated back to room and ended session in supine, all needs in reach.   Discussed d/c plan throughout session and worked on problem solving strategies to decrease fall risk at home. Pt reports he cooks most of his meals, however agreed that cooking may pose a fall risk w/ lifting, carrying, and balancing. Discussed option of Meals on Wheels, which he was open to.   Therapy Documentation Precautions:  Precautions Precautions: Fall, Back Precaution Booklet Issued:  No Precaution Comments: Reviewed log roll for comfort due to fracture Required Braces or Orthoses: (md note recommends Prevalon boot) Restrictions Weight Bearing Restrictions: No LUE Weight Bearing: Weight bearing as tolerated RLE Weight Bearing: Weight bearing as tolerated Other Position/Activity Restrictions: Prevalon Boot for wbing Vital Signs: Therapy Vitals Pulse Rate: 83 BP: (!) 152/88 Patient Position (if appropriate): Sitting  Therapy/Group: Individual Therapy  Mickle Campton Clent Demark 10/23/2019, 12:09 PM

## 2019-10-23 NOTE — Progress Notes (Signed)
Occupational Therapy Session Note  Patient Details  Name: Barry Lewis MRN: [id number] Date of Birth: 1960/11/12  Today's Date: 10/23/2019 OT Individual Time: [id number] OT Individual Time Calculation (min): 60 min    Short Term Goals: Week 1:  OT Short Term Goal 1 (Week 1): Pt will complete toilet transfer/toietling wiht S OT Short Term Goal 2 (Week 1): Pt will don B socks wiht AE PRN OT Short Term Goal 3 (Week 1): pt will thread BLE into pants with S OT Short Term Goal 4 (Week 1): Pt will groom in standing with S  Skilled Therapeutic Interventions/Progress Updates:    Pt received sitting up in recliner with mild, 3/10 c/o pain in his ribs, requesting shower for intervention. Pt used RW to complete functional mobility into bathroom with CGA. Discussed use of darco boot with PA, to follow up with ordering MD. Pt required min cueing for safety when doffing clothes. Pt completed all bathing in shower with CGA, reliant on grab bar use for standing balance stabilization. Pt required min cueing for back precaution adherence when washing feet. Pt dried off after shower, cueing for completing seated and donned socks with set up assist and increased time. Pt stood at the sink and completed oral care with CGA. Cueing required for RW management and UE placement during transitional movements. Pt donned shirt with (S), pants with CGA. All vitals WNL. Pt completed 100 ft of functional mobility x3 with rest break required between each trial, using RW. Extensive discussion with pt re PLOF, IADL completion at home, and community accessibility. Pt was left sitting up in the recliner with all needs within reach, chair alarm set.   Therapy Documentation Precautions:  Precautions Precautions: Fall, Back Precaution Booklet Issued: No Precaution Comments: Reviewed log roll for comfort due to fracture Required Braces or Orthoses: (md note recommends Prevalon boot) Restrictions Weight Bearing Restrictions: No LUE  Weight Bearing: Weight bearing as tolerated RLE Weight Bearing: Weight bearing as tolerated Other Position/Activity Restrictions: Prevalon Boot for wbing   Therapy/Group: Individual Therapy  Curtis Sites 10/23/2019, 6:55 AM

## 2019-10-23 NOTE — Progress Notes (Signed)
[facility] Progress Note    Assessment/ Plan:   1. AKI on CKD IV: Baseline 2.2-2.7. Consistent with nephrotic syndrome, suspect diabetic nephropathy.  Serology unremarkable thus far.  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  Vein mapping completed.  Cr stable (5.3) w/ lasix 80 mg daily, will continue as is.  Will need to establish with outpatient nephrology.   2. Hypertension: Elevated SBP average 150s. Will increase hydralazine to 50 mg TID, continue Norvasc as is.   3. Hyponatremia: Resolved, Na 135. Continue with Lasix and fluid restriction.    4. Secondary hyperparathyroidism: PTH 121, Ca 10.3, Phos 6.4  Start Renvela TID w/ meals.   5. Anemia of CKD: Hgb 10.2 on 2/4. Iron sat 26. Will Monitor.   6. Hyperkalemia: K 4.7.  Discontinue Lokelma.   7. Nutrition: Albumin 1.9.  Ensure enlive BID.   8. 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  9. IDDM: On insulin, per primary.   Subjective:   No acute events overnight.  Says he feels a little drowsy this morning but otherwise doing well.   Objective:   BP (!) 159/87   Pulse 70   Temp 97.9 F (36.6 C)   Resp 18   Ht 5\' 6"  (1.676 m)   Wt 84.9 kg   SpO2 95%   BMI 30.21 kg/m   Intake/Output Summary (Last 24 hours) at 10/23/2019 [id number] Last data filed at 10/23/2019 [id number] Gross per 24 hour  Intake 1135 ml  Output 1875 ml  Net -740 ml   Weight change: 0.4 kg  Physical Exam: General: Alert, NAD, sitting in the bedside chair HEENT: NCAT Cardiac: RRR no m/g/r Lungs: Clear bilaterally, no increased WOB  Abdomen: soft Msk: Moves all extremities spontaneously  Ext: Warm, dry, 1+ pitting edema to below knee bilaterally  Imaging: DG CHEST PORT 1 VIEW  Result Date: 10/22/2019 CLINICAL DATA:  Cough and pleural effusion. EXAM: PORTABLE CHEST 1 VIEW COMPARISON:  10/16/2019 FINDINGS: 1114 hours. Asymmetric elevation left hemidiaphragm, stable. Streaky opacity  in the left mid lung and left base is similar to prior with substantial overlying deformity of numerous left ribs compatible with old trauma. Right lung is better aerated than on the previous study. Cardiopericardial silhouette is at upper limits of normal for size. IMPRESSION: Interval improvement in right basilar aeration with persistent streaky parenchymal opacity in the left lung and pleuroparenchymal changes likely related to prior left chest wall trauma. No substantial pleural effusion evident on today's study. Electronically Signed   By: Misty Stanley M.D.   On: 10/22/2019 13:11   VAS [place] UPPER EXT VEIN MAPPING (PRE-OP AVF)  Result Date: 10/22/2019 UPPER EXTREMITY VEIN MAPPING  Indications: Pre-access. Performing Technologist: Antonieta Pert RDMS, RVT  Examination Guidelines: A complete evaluation includes B-mode imaging, spectral Doppler, color Doppler, and power Doppler as needed of all accessible portions of each vessel. Bilateral testing is considered an integral part of a complete examination. Limited examinations for reoccurring indications may be performed as noted. +-----------------+-------------+----------+--------------------+ Right Cephalic   Diameter (cm)Depth (cm)      Findings       +-----------------+-------------+----------+--------------------+ Shoulder             0.23        1.18                        +-----------------+-------------+----------+--------------------+ Prox upper arm       0.19  1.03                        +-----------------+-------------+----------+--------------------+ Mid upper arm        0.14        0.65                        +-----------------+-------------+----------+--------------------+ Dist upper arm                          chronically occluded +-----------------+-------------+----------+--------------------+ Antecubital fossa    0.10        0.54                         +-----------------+-------------+----------+--------------------+ Prox forearm                               not visualized    +-----------------+-------------+----------+--------------------+ Mid forearm          0.11        0.51                        +-----------------+-------------+----------+--------------------+ Dist forearm         0.09        0.41                        +-----------------+-------------+----------+--------------------+ Wrist                                      not visualized    +-----------------+-------------+----------+--------------------+ +-----------------+-------------+----------+--------------+ Right Basilic    Diameter (cm)Depth (cm)   Findings    +-----------------+-------------+----------+--------------+ Prox upper arm       0.30        1.01                  +-----------------+-------------+----------+--------------+ Mid upper arm        0.26        0.78                  +-----------------+-------------+----------+--------------+ Dist upper arm       0.24        0.75                  +-----------------+-------------+----------+--------------+ Antecubital fossa    0.24        0.67     branching    +-----------------+-------------+----------+--------------+ Prox forearm         0.24        0.53                  +-----------------+-------------+----------+--------------+ Mid forearm          0.11        0.29                  +-----------------+-------------+----------+--------------+ Distal forearm                          not visualized +-----------------+-------------+----------+--------------+ +-----------------+-------------+----------+-----------------------------------+ Left Cephalic    Diameter (cm)Depth (cm)             Findings               +-----------------+-------------+----------+-----------------------------------+ Shoulder  0.19        1.54                                        +-----------------+-------------+----------+-----------------------------------+ Prox upper arm       0.19        0.93                                       +-----------------+-------------+----------+-----------------------------------+ Mid upper arm        0.19        0.49                                       +-----------------+-------------+----------+-----------------------------------+ Dist upper arm       0.23        0.37   branch has tiny segment of chronic                                                      thrombosis              +-----------------+-------------+----------+-----------------------------------+ Antecubital fossa    0.38        0.39                branching              +-----------------+-------------+----------+-----------------------------------+ Prox forearm         0.20        0.72                                       +-----------------+-------------+----------+-----------------------------------+ Mid forearm          0.13        0.57                                       +-----------------+-------------+----------+-----------------------------------+ Dist forearm         0.12        0.51                                       +-----------------+-------------+----------+-----------------------------------+ +-----------------+-------------+----------+--------------+ Left Basilic     Diameter (cm)Depth (cm)   Findings    +-----------------+-------------+----------+--------------+ Shoulder             0.42        0.97                  +-----------------+-------------+----------+--------------+ Prox upper arm       0.31        1.07     branching    +-----------------+-------------+----------+--------------+ Mid upper arm        0.27        0.99     branching    +-----------------+-------------+----------+--------------+ Dist upper arm       0.34  0.77                   +-----------------+-------------+----------+--------------+ Antecubital fossa    0.32        0.66                  +-----------------+-------------+----------+--------------+ Prox forearm         0.27        0.57                  +-----------------+-------------+----------+--------------+ Mid forearm          0.16        0.50                  +-----------------+-------------+----------+--------------+ Distal forearm                          not visualized +-----------------+-------------+----------+--------------+ *See table(s) above for measurements and observations.  Diagnosing physician:    Preliminary     Labs: BMET Recent Labs  Lab 10/17/19 0355 10/17/19 [id number] 10/18/19 [id number] 10/18/19 0520 10/19/19 [id number] 10/20/19 0955 10/20/19 1934 10/21/19 1211 10/22/19 0101 10/23/19 0450  NA 136   < > 134*  --  132* 129* 130* 130* 128* 135  K 4.7   < > 4.9  --  5.2* 5.1 5.2* 5.1 5.4* 4.7  CL 102   < > 98  --  99 94* 96* 95* 96* 95*  CO2 21*   < > 21*  --  22 21* 22 20* 20* 25  GLUCOSE 144*   < > 214*  --  161* 240* 128* 233* 166* 135*  BUN 61*   < > 68*  --  75* 82* 90* 91* 95* 99*  CREATININE 4.92*   < > 4.99*  --  5.28* 5.23* 5.53* 5.26* 5.28* 5.30*  CALCIUM 7.2*   < > 7.4*   < > 7.7* 8.0* 8.0* 8.3*  7.8* 7.5* 8.6*  PHOS 6.2*  --  5.6*  --   --  6.6*  --  6.7* 6.6* 6.4*   < > = values in this interval not displayed.   CBC Recent Labs  Lab 10/17/19 0759 10/18/19 0520 10/19/19 0521  WBC 5.6 5.2 5.4  NEUTROABS  --   --  3.7  HGB 11.4* 10.1* 10.2*  HCT 35.0* 31.0* 31.3*  MCV 98.9 99.0 98.4  PLT 235 232 260    Medications:    . amLODipine  10 mg Oral Daily  . collagenase   Topical Daily  . docusate sodium  100 mg Oral BID  . feeding supplement (ENSURE ENLIVE)  237 mL Oral BID BM  . ferrous sulfate  325 mg Oral Q breakfast  . fluticasone  1 spray Each Nare Daily  . folic acid  1 mg Oral Daily  . furosemide  80 mg Oral Daily  . gabapentin  300 mg Oral QHS  .  heparin  5,000 Units Subcutaneous Q8H  . hydrALAZINE  25 mg Oral Q8H  . hydrocerin   Topical BID  . insulin aspart  0-15 Units Subcutaneous TID WC  . insulin glargine  15 Units Subcutaneous Daily  . OLANZapine  5 mg Oral QHS  . sodium bicarbonate  1,300 mg Oral BID  . sodium zirconium cyclosilicate  10 g Oral Daily  . thiamine  100 mg Oral Daily  . traZODone  200 mg Oral QHS  . venlafaxine XR  225 mg Oral Q breakfast  Darrelyn Hillock, DO  Family Medicine PGY-2  10/23/2019, 8:11 AM

## 2019-10-24 ENCOUNTER — Inpatient Hospital Stay (HOSPITAL_COMMUNITY): Payer: Medicare Other | Admitting: Speech Pathology

## 2019-10-24 ENCOUNTER — Inpatient Hospital Stay (HOSPITAL_COMMUNITY): Payer: Medicare Other

## 2019-10-24 ENCOUNTER — Inpatient Hospital Stay (HOSPITAL_COMMUNITY): Payer: Medicare Other | Admitting: Physical Therapy

## 2019-10-24 LAB — GLUCOSE, CAPILLARY
Glucose-Capillary: 142 mg/dL — ABNORMAL HIGH (ref 70–99)
Glucose-Capillary: 166 mg/dL — ABNORMAL HIGH (ref 70–99)
Glucose-Capillary: 181 mg/dL — ABNORMAL HIGH (ref 70–99)
Glucose-Capillary: 218 mg/dL — ABNORMAL HIGH (ref 70–99)

## 2019-10-24 LAB — RENAL FUNCTION PANEL
Albumin: 1.8 g/dL — ABNORMAL LOW (ref 3.5–5.0)
Anion gap: 13 (ref 5–15)
BUN: 98 mg/dL — ABNORMAL HIGH (ref 6–20)
CO2: 23 mmol/L (ref 22–32)
Calcium: 8.2 mg/dL — ABNORMAL LOW (ref 8.9–10.3)
Chloride: 98 mmol/L (ref 98–111)
Creatinine, Ser: 5.15 mg/dL — ABNORMAL HIGH (ref 0.61–1.24)
GFR calc Af Amer: 13 mL/min — ABNORMAL LOW (ref >60)
GFR calc non Af Amer: 12 mL/min — ABNORMAL LOW (ref >60)
Glucose, Bld: 173 mg/dL — ABNORMAL HIGH (ref 70–99)
Phosphorus: 5.7 mg/dL — ABNORMAL HIGH (ref 2.5–4.6)
Potassium: 5.4 mmol/L — ABNORMAL HIGH (ref 3.5–5.1)
Sodium: 134 mmol/L — ABNORMAL LOW (ref 135–145)

## 2019-10-24 LAB — HCV RNA QUANT

## 2019-10-24 MED ORDER — SODIUM ZIRCONIUM CYCLOSILICATE 10 G PO PACK
10.0000 g | PACK | Freq: Once | ORAL | Status: AC
  Administered 2019-10-24: 12:00:00 10 g via ORAL
  Filled 2019-10-24: qty 1

## 2019-10-24 NOTE — Progress Notes (Signed)
Speech Language Pathology Daily Session Note  Patient Details  Name: Barry Lewis MRN: [id number] Date of Birth: 07-29-1961  Today's Date: 10/24/2019 SLP Individual Time: 1435-1500 SLP Individual Time Calculation (min): 25 min  Short Term Goals: Week 1: SLP Short Term Goal 1 (Week 1): Patient will recall new, daily information with Max A multimodal cues for use of external aids. SLP Short Term Goal 2 (Week 1): Patient will demonstrate functional problem solving for basic and familiar tasks with Min A verbal cues. SLP Short Term Goal 3 (Week 1): Patient will self-monitor and correct errors during functional tasks with Mod A verbal cues. SLP Short Term Goal 4 (Week 1): Patient will demonstrate sustained attention to tasks for 30 minutes with Min A verbal cues for redirection.  Skilled Therapeutic Interventions: Skilled treatment session focused on cognitive goals. SLP facilitated session by providing external aids to maximize recall of date and place to assist in minimizing confusion at night (per patient's report). Patient independently recalled events from previous therapy sessions and requested to back into bed at end of session. Patient transferred with overall supervision for safety. Patient left supine in bed with alarm on and all needs within reach. Continue with current plan of care.      Pain No/Denies Pain   Therapy/Group: Individual Therapy  Allani Reber 10/24/2019, 3:44 PM

## 2019-10-24 NOTE — Progress Notes (Signed)
Social Work Patient ID: Barry Lewis, male   DOB: May 24, 1961, 59 y.o.   MRN: [id number]  Have reviewed team conference with pt and left VM for daughter.  Aware and agreeable with targeted d/c date of 2/18 and mod independent goals overall.  He is pleased with his progress overall.  Have asked that daughter confirm receipt of info and contact to confirm transportation can be arranged.  Broc Caspers, LCSW

## 2019-10-24 NOTE — Progress Notes (Signed)
Physical Therapy Session Note  Patient Details  Name: Barry Lewis MRN: [id number] Date of Birth: 1961-07-29  Today's Date: 10/24/2019 PT Individual Time: 0901-0954 PT Individual Time Calculation (min): 53 min   Short Term Goals: Week 1:  PT Short Term Goal 1 (Week 1): Pt will demonstrate wc to/from bed/mat w/supervision and safe use of LRAD PT Short Term Goal 2 (Week 1): Gait 267ft w/LRAD and cga PT Short Term Goal 3 (Week 1): ascend/descent 3 stairs w/single rail and cga assist  Skilled Therapeutic Interventions/Progress Updates:   Pt in supine and agreeable to therapy, no c/o pain and reports feeling well today. Total assist to don darco shoe. Supervision bed mobility and ambulated to/from bathroom w/ CGA using RW, continent of void. Stood w/ supervision at sink to wash hands. Ambulated to therapy gym w/ close supervision using RW. Worked on Personnel officer w/ rollator, discussed benefits of rollator use for independent, especially w/ household chores and IADLs. Pt verbalized understanding and prefers use of rollator after trial. Ambulated 100' x2 w/ CGA, verbal cues for rollator management and to slow speed for safety. Ambulated 50' x2 while weaving through cones, also w/ CGA. 1 rep w/ picking up cones from floor w/ min assist and verbal/visual cues for technique. Discussed decreasing fall risk w/ removing clutter from home and making clear pathways. Pt verbalized understanding and states he will work on this using rollator. Also discussed appropriate shoes for balance, pt typically wears "flip flops" but verbalizes understanding that gym shoes would be much safer and he does have a pair at home. Ambulated back to room w/ CGA using rollator, 1 seated rest break w/ verbal/visual/tactile cues for turning to sit safely in rollator. Ended session in recliner, all needs in reach.   Therapy Documentation Precautions:  Precautions Precautions: Fall, Back Precaution Booklet Issued: No Precaution  Comments: Reviewed log roll for comfort due to fracture Required Braces or Orthoses: (md note recommends Prevalon boot) Restrictions Weight Bearing Restrictions: No LUE Weight Bearing: Weight bearing as tolerated RLE Weight Bearing: Weight bearing as tolerated Other Position/Activity Restrictions: Prevalon Boot for wbing Pain: Pain Assessment Pain Scale: 0-10 Pain Score: 6  Pain Type: Acute pain Pain Location: Rib cage Pain Orientation: Right;Mid Pain Descriptors / Indicators: Discomfort Pain Frequency: Intermittent Pain Onset: On-going Patients Stated Pain Goal: 2 Pain Intervention(s): Medication (See eMAR)  Therapy/Group: Individual Therapy  Barry Lewis 10/24/2019, 9:54 AM

## 2019-10-24 NOTE — Progress Notes (Addendum)
[facility] Progress Note    Assessment/ Plan:   1. AKI on CKD IV: Baseline 2.2-2.7.  Consistent with nephrotic syndrome, suspect diabetic nephropathy. Complement/light chains unremarkable. Awaiting HCV RNA due to antibody positive, ANA still pending. Cr stable (5.15), will continue with Lasix 80mg  daily as is.    2. Hypertension: Increased hydralazine to 50 mg TID yesterday, SBP still elevated, will monitor.  Continue Norvasc.  3. Hyperkalemia: K 5.4. Will give lokelma 10g x1.  4. Secondary hyperparathyroidism: PTH 121, Ca 10.3, Phos 6.4>5.7.  Started Renvela TID w/ meals on 2/8.   5. Anemia of CKD: Hgb 10.2 on 2/4. Iron sat 26. Will Monitor.   6. Hyponatremia: Improved, Na 134. Continue with Lasix and fluid restriction.     7. Nutrition: Albumin 1.8.  Ensure enlive BID.   8. 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  9. IDDM: On insulin, per primary.   Disposition: Will schedule for outpatient Nephrology prior to discharge and follow pending serologies.   Subjective:   No acute events overnight. Doing well, relaxing.    Objective:   BP (!) 165/94 (BP Location: Left Arm)   Pulse 86   Temp 97.7 F (36.5 C) (Oral)   Resp 18   Ht 5\' 6"  (1.676 m)   Wt 83.8 kg   SpO2 96%   BMI 29.82 kg/m   Intake/Output Summary (Last 24 hours) at 10/24/2019 0816 Last data filed at 10/24/2019 0759 Gross per 24 hour  Intake 1320 ml  Output 3350 ml  Net -2030 ml   Weight change: -1.1 kg  Physical Exam: General: Alert, NAD, laying in bed  HEENT: NCAT Cardiac: RRR  Lungs: Clear bilaterally, no increased WOB  Abdomen: soft Msk: Moves all extremities spontaneously  Ext: Warm, dry, 1+ pitting edema to below knee level bilaterally   Imaging: DG CHEST PORT 1 VIEW  Result Date: 10/22/2019 CLINICAL DATA:  Cough and pleural effusion. EXAM: PORTABLE CHEST 1 VIEW COMPARISON:  10/16/2019 FINDINGS: 1114 hours. Asymmetric elevation left hemidiaphragm,  stable. Streaky opacity in the left mid lung and left base is similar to prior with substantial overlying deformity of numerous left ribs compatible with old trauma. Right lung is better aerated than on the previous study. Cardiopericardial silhouette is at upper limits of normal for size. IMPRESSION: Interval improvement in right basilar aeration with persistent streaky parenchymal opacity in the left lung and pleuroparenchymal changes likely related to prior left chest wall trauma. No substantial pleural effusion evident on today's study. Electronically Signed   By: Misty Stanley M.D.   On: 10/22/2019 13:11   VAS [place] UPPER EXT VEIN MAPPING (PRE-OP AVF)  Result Date: 10/23/2019 UPPER EXTREMITY VEIN MAPPING  Indications: Pre-access. Performing Technologist: Antonieta Pert RDMS, RVT  Examination Guidelines: A complete evaluation includes B-mode imaging, spectral Doppler, color Doppler, and power Doppler as needed of all accessible portions of each vessel. Bilateral testing is considered an integral part of a complete examination. Limited examinations for reoccurring indications may be performed as noted. +-----------------+-------------+----------+--------------------+ Right Cephalic   Diameter (cm)Depth (cm)      Findings       +-----------------+-------------+----------+--------------------+ Shoulder             0.23        1.18                        +-----------------+-------------+----------+--------------------+ Prox upper arm       0.19  1.03                        +-----------------+-------------+----------+--------------------+ Mid upper arm        0.14        0.65                        +-----------------+-------------+----------+--------------------+ Dist upper arm                          chronically occluded +-----------------+-------------+----------+--------------------+ Antecubital fossa    0.10        0.54                         +-----------------+-------------+----------+--------------------+ Prox forearm                               not visualized    +-----------------+-------------+----------+--------------------+ Mid forearm          0.11        0.51                        +-----------------+-------------+----------+--------------------+ Dist forearm         0.09        0.41                        +-----------------+-------------+----------+--------------------+ Wrist                                      not visualized    +-----------------+-------------+----------+--------------------+ +-----------------+-------------+----------+--------------+ Right Basilic    Diameter (cm)Depth (cm)   Findings    +-----------------+-------------+----------+--------------+ Prox upper arm       0.30        1.01                  +-----------------+-------------+----------+--------------+ Mid upper arm        0.26        0.78                  +-----------------+-------------+----------+--------------+ Dist upper arm       0.24        0.75                  +-----------------+-------------+----------+--------------+ Antecubital fossa    0.24        0.67     branching    +-----------------+-------------+----------+--------------+ Prox forearm         0.24        0.53                  +-----------------+-------------+----------+--------------+ Mid forearm          0.11        0.29                  +-----------------+-------------+----------+--------------+ Distal forearm                          not visualized +-----------------+-------------+----------+--------------+ +-----------------+-------------+----------+-----------------------------------+ Left Cephalic    Diameter (cm)Depth (cm)             Findings               +-----------------+-------------+----------+-----------------------------------+ Shoulder  0.19        1.54                                        +-----------------+-------------+----------+-----------------------------------+ Prox upper arm       0.19        0.93                                       +-----------------+-------------+----------+-----------------------------------+ Mid upper arm        0.19        0.49                                       +-----------------+-------------+----------+-----------------------------------+ Dist upper arm       0.23        0.37   branch has tiny segment of chronic                                                      thrombosis              +-----------------+-------------+----------+-----------------------------------+ Antecubital fossa    0.38        0.39                branching              +-----------------+-------------+----------+-----------------------------------+ Prox forearm         0.20        0.72                                       +-----------------+-------------+----------+-----------------------------------+ Mid forearm          0.13        0.57                                       +-----------------+-------------+----------+-----------------------------------+ Dist forearm         0.12        0.51                                       +-----------------+-------------+----------+-----------------------------------+ +-----------------+-------------+----------+--------------+ Left Basilic     Diameter (cm)Depth (cm)   Findings    +-----------------+-------------+----------+--------------+ Shoulder             0.42        0.97                  +-----------------+-------------+----------+--------------+ Prox upper arm       0.31        1.07     branching    +-----------------+-------------+----------+--------------+ Mid upper arm        0.27        0.99     branching    +-----------------+-------------+----------+--------------+ Dist upper arm       0.34  0.77                   +-----------------+-------------+----------+--------------+ Antecubital fossa    0.32        0.66                  +-----------------+-------------+----------+--------------+ Prox forearm         0.27        0.57                  +-----------------+-------------+----------+--------------+ Mid forearm          0.16        0.50                  +-----------------+-------------+----------+--------------+ Distal forearm                          not visualized +-----------------+-------------+----------+--------------+ *See table(s) above for measurements and observations.  Diagnosing physician: Monica Martinez MD Electronically signed by Monica Martinez MD on 10/23/2019 at 9:23:10 AM.    Final     Labs: BMET Recent Labs  Lab 10/18/19 [id number] 10/18/19 [id number] 10/19/19 [id number] 10/20/19 [id number] 10/20/19 1934 10/21/19 1211 10/22/19 0101 10/23/19 0450  NA 134*  --  132* 129* 130* 130* 128* 135  K 4.9  --  5.2* 5.1 5.2* 5.1 5.4* 4.7  CL 98  --  99 94* 96* 95* 96* 95*  CO2 21*  --  22 21* 22 20* 20* 25  GLUCOSE 214*  --  161* 240* 128* 233* 166* 135*  BUN 68*  --  75* 82* 90* 91* 95* 99*  CREATININE 4.99*  --  5.28* 5.23* 5.53* 5.26* 5.28* 5.30*  CALCIUM 7.4*   < > 7.7* 8.0* 8.0* 8.3*  7.8* 7.5* 8.6*  PHOS 5.6*  --   --  6.6*  --  6.7* 6.6* 6.4*   < > = values in this interval not displayed.   CBC Recent Labs  Lab 10/18/19 0520 10/19/19 0521  WBC 5.2 5.4  NEUTROABS  --  3.7  HGB 10.1* 10.2*  HCT 31.0* 31.3*  MCV 99.0 98.4  PLT 232 260    Medications:    . amLODipine  10 mg Oral Daily  . collagenase   Topical Daily  . docusate sodium  100 mg Oral BID  . feeding supplement (ENSURE ENLIVE)  237 mL Oral BID BM  . ferrous sulfate  325 mg Oral Q breakfast  . fluticasone  1 spray Each Nare Daily  . folic acid  1 mg Oral Daily  . furosemide  80 mg Oral Daily  . gabapentin  300 mg Oral QHS  . heparin  5,000 Units Subcutaneous Q8H  . hydrALAZINE  50 mg Oral Q8H  . hydrocerin    Topical BID  . insulin aspart  0-15 Units Subcutaneous TID WC  . insulin glargine  18 Units Subcutaneous Daily  . OLANZapine  5 mg Oral QHS  . polyethylene glycol  17 g Oral Daily  . sevelamer carbonate  1,600 mg Oral TID WC  . sodium bicarbonate  1,300 mg Oral BID  . thiamine  100 mg Oral Daily  . traZODone  200 mg Oral QHS  . venlafaxine XR  225 mg Oral Q breakfast      Darrelyn Hillock, DO  Family Medicine PGY-2  10/24/2019, 8:16 AM

## 2019-10-24 NOTE — Progress Notes (Signed)
Patient's family is very concerned over patient's current living situation and is afraid he will not truly ask for help or the resources he will need. Patient's sister states he has just "got by" by himself at home and never actually sought after help. Sister wants the doctor to call the brother about outpatient resources and the doctor's idea as to how he will do when discharged [phone], [name]

## 2019-10-24 NOTE — Progress Notes (Signed)
Occupational Therapy Session Note  Patient Details  Name: Barry Lewis MRN: [id number] Date of Birth: 07/25/1961  Today's Date: 10/24/2019 OT Individual Time: 1100-1200 OT Individual Time Calculation (min): 60 min   Session 2:  OT Individual Time: 1300-1400 OT Individual Time Calculation (min): 60 min     Short Term Goals: Week 1:  OT Short Term Goal 1 (Week 1): Pt will complete toilet transfer/toietling wiht S OT Short Term Goal 2 (Week 1): Pt will don B socks wiht AE PRN OT Short Term Goal 3 (Week 1): pt will thread BLE into pants with S OT Short Term Goal 4 (Week 1): Pt will groom in standing with S  Skilled Therapeutic Interventions/Progress Updates:    Session 1: Pt received sitting up in the recliner with no c/o pain.Pt donned darco boot with set up assist. Pt used rollator to complete 100 ft of functional mobility with CGA. Pt edu on energy conservation and importance of planned rest breaks. Pt requiring min cueing for rollator management. In the therapy gym pt completed functional reaching distally in standing, with focus on UE placement on rollator and maintenance of back precautions. CGA overall provided. Extensive discussion throughout session re safety in the home and fall prevention/recovery. Pt completed floor transfer to mat with CGA, mod cueing overall for technique and instruction. Problem solving through pt's home set up and planning through potential fall situations. Pt completed another floor transfer with use of rollator for UE support to achieve full stand- min A overall. Pt returned to his room and was left sitting up in the recliner with all needs met, chair alarm set.   Session 2: Pt received in recliner ready for session with no c/o pain. Pt completed functional mobility into bathroom with rollator to transfer onto TTB in walk in shower. Pt with good carryover of energy conservation strategies. Min cueing for rollator management. Pt's dorsal ulcers on R LE were  occluded for shower, as well as IV. Pt completed all bathing at a (S) level with use of grab bar. Pt returned to sitting in the recliner and donned shirt with (S). Pants donned CGA. Ulcers re-dressed per RN consult. Pt completed simulated laundry/cleaning task with rollator with mod cueing overall for rollator management. Pt cued on using rollator for seat when performing activities like oral care at the sink. Pt completed 100 ft of functional mobility before needing a seated rest break. Pt completed another 100 ft to the ADL apt where he completed a transfer using the TTB. Pt required CGA overall and mod cueing for rollator management. Pt returned to his room and was left sitting up in the recliner with all needs met. Chair pad alarm activated.   Therapy Documentation Precautions:  Precautions Precautions: Fall, Back Precaution Booklet Issued: No Precaution Comments: Reviewed log roll for comfort due to fracture Required Braces or Orthoses: (md note recommends Prevalon boot) Restrictions Weight Bearing Restrictions: No LUE Weight Bearing: Weight bearing as tolerated RLE Weight Bearing: Weight bearing as tolerated Other Position/Activity Restrictions: Prevalon Boot for wbing Therapy/Group: Individual Therapy  Curtis Sites 10/24/2019, 6:52 AM

## 2019-10-24 NOTE — Patient Care Conference (Signed)
Inpatient RehabilitationTeam Conference and Plan of Care Update Date: 10/24/2019   Time: 10:20 AM    Patient Name: Barry Lewis      Medical Record Number: [id number]  Date of Birth: May 08, 1961 Sex: Male         Room/Bed: 4W24C/4W24C-01 Payor Info: Payor: MEDICARE / Plan: MEDICARE PART A AND B / Product Type: *No Product type* /    Admit Date/Time:  10/18/2019  1:59 PM  Primary Diagnosis:  Multiple trauma  Patient Active Problem List   Diagnosis Date Noted  . Left scapula fracture 10/22/2019  . Acute renal failure superimposed on stage 4 chronic kidney disease ([place])   . AKI (acute kidney injury) ([place])   . Chronic combined systolic and diastolic CHF (congestive heart failure) ([place])   . Multiple closed fractures of ribs of both sides   . Closed nondisplaced fracture of seventh cervical vertebra ([place])   . Compression fracture of T3 vertebra ([facility])   . Hyperglycemia   . Trauma   . Multiple trauma   . 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 Diabetic ulcer of right midfoot associated with diabetes mellitus due to underlying condition, limited to breakdown of skin ([place])   . Severe protein-calorie malnutrition ([place])   . Anasarca   . Rhabdomyolysis 10/13/2019  . Fall at home, initial encounter 10/13/2019  . Hypertension   . Dyslipidemia   . Diabetes mellitus without complication ([place])   . Dementia ([place])   . COPD (chronic obstructive pulmonary disease) ([place])   . CKD stage 4 secondary to hypertension ([place])   . Chronic diastolic (congestive) heart failure [facility])     Expected Discharge Date: Expected Discharge Date: 11/02/19  Team Members Present: Physician leading conference: Dr. Alger Simons Social Worker Present: Lennart Pall, LCSW Nurse Present: Other (comment)(Blair Montanti, LPN) Case Manager: Karene Fry, RN PT Present: Burnard Bunting, PT OT Present: Laverle Hobby, OT SLP Present: Weston Anna, SLP PPS Coordinator present : Ileana Ladd, Burna Mortimer, SLP   Current Status/Progress Goal Weekly Team Focus  Bowel/Bladder   Pt continent of B/B. LBM 2/8 after given miralax, however pt flushed before seen by RN  remain continent  assess toileting q shift and prn   Swallow/Nutrition/ Hydration             ADL's   (S) UB ADLs, CGA LB ADLs, (S)-CGA ADL transfers, requires cueing for back precautions  mod I overall  ADL retraining, functional activity tolerance, ADL transfers, d/c planning, IADLs   Mobility   min assist w/o AD, CGA w/ RW or rollator x150', 31/56 on [place] (likely to be less w/ darco shoe)  mod/i  balance, endurance, d/c planning   Communication             Safety/Cognition/ Behavioral Observations  Min-Mod A  Min-Mod A  problem solving, attention, recall and awareness   Pain   Pt c/o chest pain, seems to be related to rib fractures rather than cardiac. Pt has PRNs  pain less than 7  assess pain q shift and prn   Skin   Abrasions on head, arm, leg, and knee. Cracking on heels. Generalized ecchymosis. Has orders for wound care on right foot and knees.   continue with wound care, prevent further skin breakdown  assess skin q shift and prn    Rehab Goals Patient on target to meet rehab goals: Yes *See Care Plan and progress notes for long and short-term goals.     Barriers to Discharge  Current Status/Progress Possible Resolutions Date Resolved   Nursing                  PT                    OT Decreased caregiver support;Lack of/limited family support                SLP                SW                Discharge Planning/Teaching Needs:  Pt plans to d/c to his home with only VERY intermitent assistance from daughter  Teaching needs TBD.   Team Discussion: h/o CVA, schizoaffective, fell, spinal/chest injuries, chest pain, rhabdomyelitis, toxic metabolic encephalopathy, wound/ulcer follow up with Dr. Sharol Given, dm, Dtr is a traveling nurse.  RN cont B/B, knee drsg, drsg R foot, rib cage pain.  OT S UB ADL, CGA LB ADL, S/CGA  transfers, will be alone.  PT CGA walker/rollator, did well, balance an issue, goals mod I.  SLP cognition impairment.   Revisions to Treatment Plan: N/A     Medical Summary Current Status: rhabdomyolysis, metabolic encephalopathy, multiple trauma after fall, wound care, dm, htn Weekly Focus/Goal: wound care, pain mgt, bp control, dm  Barriers to Discharge: Medical stability       Continued Need for Acute Rehabilitation Level of Care: The patient requires daily medical management by a physician with specialized training in physical medicine and rehabilitation for the following reasons: Direction of a multidisciplinary physical rehabilitation program to maximize functional independence : Yes Medical management of patient stability for increased activity during participation in an intensive rehabilitation regime.: Yes Analysis of laboratory values and/or radiology reports with any subsequent need for medication adjustment and/or medical intervention. : Yes   I attest that I was present, lead the team conference, and concur with the assessment and plan of the team.   Retta Diones 10/24/2019, 4:56 PM   Team conference was held via web/ teleconference due to [place] - 19

## 2019-10-24 NOTE — Progress Notes (Signed)
[facility] PHYSICAL MEDICINE & REHABILITATION PROGRESS NOTE   Subjective/Complaints: Up in bed. Had a pretty good night of sleep. No new complaints. Having intermittent chest wall/sternal pain. Bowels and bladder are emptying  ROS: Patient denies fever, rash, sore throat, blurred vision, nausea, vomiting, diarrhea, cough, shortness of breath or chest pain, headache, or mood change.    Objective:   DG CHEST PORT 1 VIEW  Result Date: 10/22/2019 CLINICAL DATA:  Cough and pleural effusion. EXAM: PORTABLE CHEST 1 VIEW COMPARISON:  10/16/2019 FINDINGS: 1114 hours. Asymmetric elevation left hemidiaphragm, stable. Streaky opacity in the left mid lung and left base is similar to prior with substantial overlying deformity of numerous left ribs compatible with old trauma. Right lung is better aerated than on the previous study. Cardiopericardial silhouette is at upper limits of normal for size. IMPRESSION: Interval improvement in right basilar aeration with persistent streaky parenchymal opacity in the left lung and pleuroparenchymal changes likely related to prior left chest wall trauma. No substantial pleural effusion evident on today's study. Electronically Signed   By: Misty Stanley M.D.   On: 10/22/2019 13:11   VAS [place] UPPER EXT VEIN MAPPING (PRE-OP AVF)  Result Date: 10/23/2019 UPPER EXTREMITY VEIN MAPPING  Indications: Pre-access. Performing Technologist: Antonieta Pert RDMS, RVT  Examination Guidelines: A complete evaluation includes B-mode imaging, spectral Doppler, color Doppler, and power Doppler as needed of all accessible portions of each vessel. Bilateral testing is considered an integral part of a complete examination. Limited examinations for reoccurring indications may be performed as noted. +-----------------+-------------+----------+--------------------+ Right Cephalic   Diameter (cm)Depth (cm)      Findings       +-----------------+-------------+----------+--------------------+  Shoulder             0.23        1.18                        +-----------------+-------------+----------+--------------------+ Prox upper arm       0.19        1.03                        +-----------------+-------------+----------+--------------------+ Mid upper arm        0.14        0.65                        +-----------------+-------------+----------+--------------------+ Dist upper arm                          chronically occluded +-----------------+-------------+----------+--------------------+ Antecubital fossa    0.10        0.54                        +-----------------+-------------+----------+--------------------+ Prox forearm                               not visualized    +-----------------+-------------+----------+--------------------+ Mid forearm          0.11        0.51                        +-----------------+-------------+----------+--------------------+ Dist forearm         0.09        0.41                        +-----------------+-------------+----------+--------------------+  Wrist                                      not visualized    +-----------------+-------------+----------+--------------------+ +-----------------+-------------+----------+--------------+ Right Basilic    Diameter (cm)Depth (cm)   Findings    +-----------------+-------------+----------+--------------+ Prox upper arm       0.30        1.01                  +-----------------+-------------+----------+--------------+ Mid upper arm        0.26        0.78                  +-----------------+-------------+----------+--------------+ Dist upper arm       0.24        0.75                  +-----------------+-------------+----------+--------------+ Antecubital fossa    0.24        0.67     branching    +-----------------+-------------+----------+--------------+ Prox forearm         0.24        0.53                   +-----------------+-------------+----------+--------------+ Mid forearm          0.11        0.29                  +-----------------+-------------+----------+--------------+ Distal forearm                          not visualized +-----------------+-------------+----------+--------------+ +-----------------+-------------+----------+-----------------------------------+ Left Cephalic    Diameter (cm)Depth (cm)             Findings               +-----------------+-------------+----------+-----------------------------------+ Shoulder             0.19        1.54                                       +-----------------+-------------+----------+-----------------------------------+ Prox upper arm       0.19        0.93                                       +-----------------+-------------+----------+-----------------------------------+ Mid upper arm        0.19        0.49                                       +-----------------+-------------+----------+-----------------------------------+ Dist upper arm       0.23        0.37   branch has tiny segment of chronic                                                      thrombosis              +-----------------+-------------+----------+-----------------------------------+  Antecubital fossa    0.38        0.39                branching              +-----------------+-------------+----------+-----------------------------------+ Prox forearm         0.20        0.72                                       +-----------------+-------------+----------+-----------------------------------+ Mid forearm          0.13        0.57                                       +-----------------+-------------+----------+-----------------------------------+ Dist forearm         0.12        0.51                                       +-----------------+-------------+----------+-----------------------------------+  +-----------------+-------------+----------+--------------+ Left Basilic     Diameter (cm)Depth (cm)   Findings    +-----------------+-------------+----------+--------------+ Shoulder             0.42        0.97                  +-----------------+-------------+----------+--------------+ Prox upper arm       0.31        1.07     branching    +-----------------+-------------+----------+--------------+ Mid upper arm        0.27        0.99     branching    +-----------------+-------------+----------+--------------+ Dist upper arm       0.34        0.77                  +-----------------+-------------+----------+--------------+ Antecubital fossa    0.32        0.66                  +-----------------+-------------+----------+--------------+ Prox forearm         0.27        0.57                  +-----------------+-------------+----------+--------------+ Mid forearm          0.16        0.50                  +-----------------+-------------+----------+--------------+ Distal forearm                          not visualized +-----------------+-------------+----------+--------------+ *See table(s) above for measurements and observations.  Diagnosing physician: Monica Martinez MD Electronically signed by Monica Martinez MD on 10/23/2019 at 9:23:10 AM.    Final    No results for input(s): WBC, HGB, HCT, PLT in the last 72 hours. Recent Labs    10/23/19 0450 10/24/19 0853  NA 135 134*  K 4.7 5.4*  CL 95* 98  CO2 25 23  GLUCOSE 135* 173*  BUN 99* 98*  CREATININE 5.30* 5.15*  CALCIUM 8.6* 8.2*    Intake/Output Summary (Last 24 hours) at 10/24/2019  [place] filed at 10/24/2019 1049 Gross per 24 hour  Intake 1330 ml  Output 3000 ml  Net -1670 ml     Physical Exam: Vital Signs Blood pressure (!) 165/94, pulse 86, temperature 97.7 F (36.5 C), temperature source Oral, resp. rate 18, height 5\' 6"  (1.676 m), weight 83.8 kg, SpO2 96 %. Constitutional: No  distress . Vital signs reviewed. HEENT: EOMI, oral membranes moist Neck: supple Cardiovascular: RRR without murmur. No JVD    Respiratory: CTA Bilaterally without wheezes or rales. Normal effort    GI: BS +, non-tender, non-distended  Musculoskeletal:     Comments: No edema or tenderness in extremities  Neurological: He is alert.  Oriented to place person. Fair insight. STM deficits present.  Motor: Bilateral upper extremities: 4+/5 proximal distal right lower extremity: Hip flexion, knee extension 4/5, ankle dorsiflexion 4+/5 Left lower extremity: Hip flexion, knee extension 4 -/5, ankle dorsiflexion 4+/5--motor exam stable. Sensation diminished to light touch bilateral feet. Stable motor/sensory exam Skin:  Eschar, dried blood on right first MT--damp dressing in place. Left knee incision with foam dressing Psychiatric: generally very pleasant    Assessment/Plan: 1. Functional deficits secondary to encephalopathy, polytrauma which require 3+ hours per day of interdisciplinary therapy in a comprehensive inpatient rehab setting.  Physiatrist is providing close team supervision and 24 hour management of active medical problems listed below.  Physiatrist and rehab team continue to assess barriers to discharge/monitor patient progress toward functional and medical goals  Care Tool:  Bathing    Body parts bathed by patient: Right arm, Left arm, Chest, Abdomen, Front perineal area, Right upper leg, Left upper leg, Right lower leg, Left lower leg, Face, Buttocks   Body parts bathed by helper: Buttocks     Bathing assist Assist Level: Supervision/Verbal cueing     Upper Body Dressing/Undressing Upper body dressing   What is the patient wearing?: Pull over shirt    Upper body assist Assist Level: Supervision/Verbal cueing    Lower Body Dressing/Undressing Lower body dressing      What is the patient wearing?: Pants     Lower body assist Assist for lower body dressing:  Contact Guard/Touching assist     Toileting Toileting    Toileting assist Assist for toileting: Supervision/Verbal cueing Assistive Device Comment: urinal   Transfers Chair/bed transfer  Transfers assist     Chair/bed transfer assist level: Contact Guard/Touching assist     Locomotion Ambulation   Ambulation assist   Ambulation activity did not occur: Safety/medical concerns(used cane PTA, unsafe to attempt)  Assist level: Contact Guard/Touching assist Assistive device: Rollator Max distance: 150'   Walk 10 feet activity   Assist  Walk 10 feet activity did not occur: Safety/medical concerns(unsafe to attempt w/cane)  Assist level: Contact Guard/Touching assist Assistive device: Rollator   Walk 50 feet activity   Assist Walk 50 feet with 2 turns activity did not occur: Safety/medical concerns  Assist level: Contact Guard/Touching assist Assistive device: Rollator    Walk 150 feet activity   Assist Walk 150 feet activity did not occur: Safety/medical concerns  Assist level: Contact Guard/Touching assist Assistive device: Rollator    Walk 10 feet on uneven surface  activity   Assist Walk 10 feet on uneven surfaces activity did not occur: Safety/medical concerns         Wheelchair     Assist Will patient use wheelchair at discharge?: No             Wheelchair 50 feet  with 2 turns activity    Assist            Wheelchair 150 feet activity     Assist          Blood pressure (!) 165/94, pulse 86, temperature 97.7 F (36.5 C), temperature source Oral, resp. rate 18, height 5\' 6"  (1.676 m), weight 83.8 kg, SpO2 96 %.  Medical Problem List and Plan: 1.  Debility/encephalopathy secondary to polytrauma.             -patient may not shower             -ELOS/Goals: 12-16 days/supervision.             -continue CIR therapies including PT and OT and SLP  -  darco shoe for right foot to off load 1st MT head while standing    -team conf today---mod I goals 2.  Antithrombotics: -DVT/anticoagulation:  SQ Heparin             -antiplatelet therapy: N/A 3. Pain Management/Chronic back pain: Neurontin 300 mg nightly, trazodone 200 mg nightly oxycodone as needed 4. Mood:/Dementia With history of bipolar disorder.  Effexor 225 mg daily             -antipsychotic agents: Zyprexa 5 mg nightly  -mood seems controlled and is sleeping at present 5. Neuropsych: This patient is capable of making decisions on his own behalf. 6. Skin/Wound Care:  2/5 -will change to santyl dressing for right 1st MT areas of eschar/callus   -darco shoe ordered to off load forefoot  2/9 continue santyl right first MT head area 7. Fluids/Electrolytes/Nutrition: Routine in and outs.  CMP ordered. 8.  Acute on chronic renal failure stage IV.  Baseline creatinine 2.2-2.7.  Follow-up renal services.  Continue Lasix 80 mg twice daily.  No current plan for hemodialysis              2/5: Cr holding at 5.23 today--has been steadily rising   -nephrology following, decreased lasix, lokelma rx'ed briefly   -plan was for vein mapping in prep of AVF   -U/O 1350cc yesterday  2/9: Cr down to 5.15 today, renvela started by nephro   -will need outpt follow up   -renal signed off for now. Appreciate their help! 9.  Rhabdomyolysis.  CK trending down. 10.  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  Hemoglobin A1c 8.5.  Lantus insulin 15 units daily.  Check blood sugars before meals and at bedtime.  Diabetic teaching             2/5 sugars remain elevated.   2/9 increased lantus to 18u daily.   -cover sith SSI   -sugars showing some improvement already today   CBG (last 3)  Recent Labs    10/23/19 1626 10/23/19 2101 10/24/19 0602  GLUCAP 285* 179* 142*      11. Hypertension.Norvasc 10 mg daily,Hydralazine 25 mg every 8 hours             2/9 some increase today. Follow for more consistent pattern   -no changes at present 12.   Combined systolic and diastolic congestive heart failure.  Monitor for any signs of fluid overload.  Continue diuresis             2/5 Daily weights needed. None today  2/8-9 weights stable   Filed Weights   10/22/19 0643 10/23/19 0500 10/24/19 0429  Weight: 84.5 kg 84.9 kg 83.8 kg    13.  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  Continue Prevalon boot.  Follow-up outpatient Dr. Sharol Given to plan for debridement.  No surgical intervention necessary at this time  -see above #6 14.  Left pleural effusion with history of COPD.  Continue diuresis.  May need thoracentesis.  Follow-up chest x-ray as needed  IS, OOB 15.  Destructive process involving sternum suspect old fracture versus infection.  Latest blood cultures no growth to date.  Radiologist reviewed likely chronic smoldering process.  Follow-up outpatient CVTS.  -rx pain as needed, appears controlled  2/8 likely cause of w/e chest pain.       Appreciate cardiology follow up 16.  T3 as well as T1 and T2 right transverse process fractures.  Conservative care per Dr. Ashok Pall 17.  Right scapular fracture.  Weightbearing as tolerated 18.  Bilateral rib fractures.  Conservative care. 19.  History of polysubstance abuse tobacco and alcohol abuse.  Urine drug screen negative.  Provide counseling 20.  Medical noncompliance.  Provide counseling     LOS: 6 days A FACE TO FACE EVALUATION WAS PERFORMED  Meredith Staggers 10/24/2019, 10:53 AM

## 2019-10-25 ENCOUNTER — Inpatient Hospital Stay (HOSPITAL_COMMUNITY): Payer: Medicare Other | Admitting: Speech Pathology

## 2019-10-25 ENCOUNTER — Inpatient Hospital Stay (HOSPITAL_COMMUNITY): Payer: Medicare Other

## 2019-10-25 ENCOUNTER — Inpatient Hospital Stay (HOSPITAL_COMMUNITY): Payer: Medicare Other | Admitting: Physical Therapy

## 2019-10-25 LAB — RENAL FUNCTION PANEL
Albumin: 2.1 g/dL — ABNORMAL LOW (ref 3.5–5.0)
Anion gap: 15 (ref 5–15)
BUN: 96 mg/dL — ABNORMAL HIGH (ref 6–20)
CO2: 22 mmol/L (ref 22–32)
Calcium: 8.5 mg/dL — ABNORMAL LOW (ref 8.9–10.3)
Chloride: 97 mmol/L — ABNORMAL LOW (ref 98–111)
Creatinine, Ser: 5.23 mg/dL — ABNORMAL HIGH (ref 0.61–1.24)
GFR calc Af Amer: 13 mL/min — ABNORMAL LOW (ref >60)
GFR calc non Af Amer: 11 mL/min — ABNORMAL LOW (ref >60)
Glucose, Bld: 252 mg/dL — ABNORMAL HIGH (ref 70–99)
Phosphorus: 6.3 mg/dL — ABNORMAL HIGH (ref 2.5–4.6)
Potassium: 5.2 mmol/L — ABNORMAL HIGH (ref 3.5–5.1)
Sodium: 134 mmol/L — ABNORMAL LOW (ref 135–145)

## 2019-10-25 LAB — GLUCOSE, CAPILLARY
Glucose-Capillary: 101 mg/dL — ABNORMAL HIGH (ref 70–99)
Glucose-Capillary: 210 mg/dL — ABNORMAL HIGH (ref 70–99)
Glucose-Capillary: 189 mg/dL — ABNORMAL HIGH (ref 70–99)
Glucose-Capillary: 267 mg/dL — ABNORMAL HIGH (ref 70–99)

## 2019-10-25 MED ORDER — HYDRALAZINE HCL 50 MG PO TABS
100.0000 mg | ORAL_TABLET | Freq: Three times a day (TID) | ORAL | Status: DC
  Administered 2019-10-25 – 2019-11-03 (×27): 100 mg via ORAL
  Filled 2019-10-25 (×27): qty 2

## 2019-10-25 MED ORDER — HYDROXYZINE HCL 10 MG PO TABS
10.0000 mg | ORAL_TABLET | Freq: Three times a day (TID) | ORAL | Status: DC | PRN
  Administered 2019-10-25 – 2019-11-03 (×3): 10 mg via ORAL
  Filled 2019-10-25 (×5): qty 1

## 2019-10-25 MED ORDER — LIDOCAINE 5 % EX PTCH
1.0000 | MEDICATED_PATCH | CUTANEOUS | Status: DC
  Administered 2019-10-25 – 2019-10-26 (×2): 1 via TRANSDERMAL
  Filled 2019-10-25 (×2): qty 1

## 2019-10-25 MED ORDER — SODIUM ZIRCONIUM CYCLOSILICATE 10 G PO PACK
10.0000 g | PACK | Freq: Two times a day (BID) | ORAL | Status: AC
  Administered 2019-10-25 (×2): 10 g via ORAL
  Filled 2019-10-25 (×2): qty 1

## 2019-10-25 NOTE — Progress Notes (Signed)
Patient restlessness this rotation increase d/t pain in rib cage. Writer administered oxycodone 10 mg IR last night at 2142, f/u result somewhat effective. Patient called for pain medicine again at 0142, but too early for oxycodone so Tylenol was given. At 2 AM, patient requested oxycodone d/t pain is increasing. Oxycodone 10 mg was given but patient continuously moaning and groaning. Writer offered and applied heating pack to the R rib cage, will continue to monitor.

## 2019-10-25 NOTE — Progress Notes (Signed)
Physical Therapy Session Note  Patient Details  Name: Barry Lewis MRN: [id number] Date of Birth: 02/13/1961  Today's Date: 10/25/2019 PT Individual Time: 1407-1500 PT Individual Time Calculation (min): 53 min   Short Term Goals: Week 1:  PT Short Term Goal 1 (Week 1): Pt will demonstrate wc to/from bed/mat w/supervision and safe use of LRAD PT Short Term Goal 2 (Week 1): Gait 241ft w/LRAD and cga PT Short Term Goal 3 (Week 1): ascend/descent 3 stairs w/single rail and cga assist  Skilled Therapeutic Interventions/Progress Updates:   Pt in recliner and agreeable to therapy, pain 7/10 in low back, pt premedicated. Sit>stand w/ CGA and ambulated to/from therapy gym and around unit w/ CGA to close supervision. Ambulated 100-150' at a time w/ rollator and R darco shoe. NuStep 5 min x2 @ level 4 to work on strength and endurance training. Worked on Lobbyist w/ ambulating around gym and collecting cones w/ either RUE or LUE, emphasized brake management and transitioning hands to opposite hand rest to keep weight distributed well as he reaches to floor to pick up cones. Performed x6 reps w/ each UE. Seated rest breaks throughout session 2/2 fatigue and increased work of breathing. Ambulated back to room and ended session in supine, all needs in reach. Kpad applied to R lateral ribcage and wrapped to lumbar region for pain relief. Therapist wrote in memory notebook for pt, pt able to independently state activities of session w/ increased time.   Therapy Documentation Precautions:  Precautions Precautions: Fall, Back Precaution Booklet Issued: No Precaution Comments: Reviewed log roll for comfort due to fracture Required Braces or Orthoses: (md note recommends Prevalon boot) Restrictions Weight Bearing Restrictions: No LUE Weight Bearing: Weight bearing as tolerated RLE Weight Bearing: Weight bearing as tolerated Other Position/Activity Restrictions: Prevalon Boot for  wbing  Therapy/Group: Individual Therapy  Eryca Bolte Clent Demark 10/25/2019, 3:03 PM

## 2019-10-25 NOTE — Progress Notes (Signed)
[facility] Progress Note    Assessment/ Plan:   1. AKI on CKD IV: Baseline 2.2-2.7.  Consistent with nephrotic syndrome, suspect diabetic nephropathy.  ANA still pending, otherwise serological work-up unremarkable.  Will repeat HCV RNA due to inadequate specimen. Cr stable (5.15>5.23), will continue with Lasix 80mg  daily as is.    2. Hypertension: SBP 160-180s. Increase hydralazine to 100 mg TID. Continue Norvasc.  3. Hyperkalemia: K 5.2. Will give lokelma BID for today.   4. Secondary hyperparathyroidism: PTH 121, Ca 10.3, Phos 6.3.  Started Renvela TID w/ meals on 2/8.   5. Anemia of CKD: Hgb 10.2 on 2/4. Iron sat 26. Will Monitor.   6. Hyponatremia: Improved. Corrected to 138 for hyperglycemia. Continue with Lasix and fluid restriction.     7. Nutrition: Albumin 2.1.  Ensure enlive BID.   8. 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  9. IDDM: On insulin, per primary.   Disposition: Will schedule for outpatient Nephrology prior to discharge and follow pending serologies.   Subjective:   No acute events overnight.  Doing well this morning.   Objective:   BP (!) 186/96 (BP Location: Left Arm)   Pulse 86   Temp 98.2 F (36.8 C) (Oral)   Resp 18   Ht 5\' 6"  (1.676 m)   Wt 84.1 kg   SpO2 100%   BMI 29.93 kg/m   Intake/Output Summary (Last 24 hours) at 10/25/2019 1030 Last data filed at 10/25/2019 0758 Gross per 24 hour  Intake 1630 ml  Output 1185 ml  Net 445 ml   Weight change: 0.3 kg  Physical Exam: General: Alert, NAD HEENT: NCAT Cardiac: RRR Lungs: Clear bilaterally, no increased WOB  Abdomen: soft Msk: Moves all extremities spontaneously  Ext: Warm, dry, 1+ pitting edema to below knee    Imaging: No results found.  Labs: BMET Recent Labs  Lab 10/19/19 0521 10/19/19 0521 10/20/19 0955 10/20/19 1934 10/21/19 1211 10/22/19 0101 10/23/19 0450 10/24/19 0853  NA 132*  --  129* 130* 130* 128* 135 134*  K  5.2*  --  5.1 5.2* 5.1 5.4* 4.7 5.4*  CL 99  --  94* 96* 95* 96* 95* 98  CO2 22  --  21* 22 20* 20* 25 23  GLUCOSE 161*  --  240* 128* 233* 166* 135* 173*  BUN 75*  --  82* 90* 91* 95* 99* 98*  CREATININE 5.28*  --  5.23* 5.53* 5.26* 5.28* 5.30* 5.15*  CALCIUM 7.7*   < > 8.0* 8.0* 8.3*  7.8* 7.5* 8.6* 8.2*  PHOS  --   --  6.6*  --  6.7* 6.6* 6.4* 5.7*   < > = values in this interval not displayed.   CBC Recent Labs  Lab 10/19/19 0521  WBC 5.4  NEUTROABS 3.7  HGB 10.2*  HCT 31.3*  MCV 98.4  PLT 260    Medications:    . amLODipine  10 mg Oral Daily  . collagenase   Topical Daily  . docusate sodium  100 mg Oral BID  . feeding supplement (ENSURE ENLIVE)  237 mL Oral BID BM  . ferrous sulfate  325 mg Oral Q breakfast  . fluticasone  1 spray Each Nare Daily  . folic acid  1 mg Oral Daily  . furosemide  80 mg Oral Daily  . gabapentin  300 mg Oral QHS  . heparin  5,000 Units Subcutaneous Q8H  . hydrALAZINE  100 mg Oral Q8H  .  hydrocerin   Topical BID  . insulin aspart  0-15 Units Subcutaneous TID WC  . insulin glargine  18 Units Subcutaneous Daily  . OLANZapine  5 mg Oral QHS  . polyethylene glycol  17 g Oral Daily  . sevelamer carbonate  1,600 mg Oral TID WC  . sodium bicarbonate  1,300 mg Oral BID  . thiamine  100 mg Oral Daily  . traZODone  200 mg Oral QHS  . venlafaxine XR  225 mg Oral Q breakfast      Darrelyn Hillock, DO  Family Medicine PGY-2  10/25/2019, 10:30 AM

## 2019-10-25 NOTE — Plan of Care (Signed)
Several goals upgraded 2/10 to reflect lack of supervision at home and need to be at mod I level.   Problem: RH Bathing Goal: LTG Patient will bathe all body parts with assist levels (OT) Description: LTG: Patient will bathe all body parts with assist levels (OT) Flowsheets (Taken 10/25/2019 0911) LTG: Pt will perform bathing with assistance level/cueing: (goal upgraded 2/10) Independent with assistive device    Problem: RH Simple Meal Prep Goal: LTG Patient will perform simple meal prep w/assist (OT) Description: LTG: Patient will perform simple meal prep with assistance, with/without cues (OT). Flowsheets (Taken 10/25/2019 0911) LTG: Pt will perform simple meal prep with assistance level of: (goal upgraded 2/10) Independent with assistive device   Problem: RH Laundry Goal: LTG Patient will perform laundry w/assist, cues (OT) Description: LTG: Patient will perform laundry with assistance, with/without cues (OT). Flowsheets (Taken 10/25/2019 0911) LTG: Pt will perform laundry with assistance level of: (goal upgraded 2/10) Independent with assistive device   Problem: RH Light Housekeeping Goal: LTG Patient will perform light housekeeping w/assist (OT) Description: LTG: Patient will perform light housekeeping with assistance, with/without cues (OT). Flowsheets (Taken 10/25/2019 0911) LTG: Pt will perform light housekeeping with assistance level of: (goal upgraded 2/10) Independent with assistive device

## 2019-10-25 NOTE — Progress Notes (Signed)
Physical Therapy Weekly Progress Note  Patient Details  Name: Barry Lewis MRN: [id number] Date of Birth: [birth year]  Beginning of progress report period: October 19, 2019 End of progress report period: October 26, 2019   Patient has met 1 of 3 short term goals. Pt has made steady progress towards LTGs over last week, he is consistently performing all mobility w/ CGA including bed mobility, transfers, and gait w/ rollator. Pt refused today's session - was not able to practice stair negotiation which was the plan for today. Dynamic balance and safety awareness have both improved and he continues to greatly benefit from PT intervention to decrease fall risk. Pt was a significant fall risk prior to admission, he reports he fell multiple times per Barry.   Patient continues to demonstrate the following deficits muscle weakness, decreased cardiorespiratoy endurance, decreased coordination and decreased motor planning, decreased safety awareness and decreased memory and decreased standing balance, decreased postural control and decreased balance strategies and therefore will continue to benefit from skilled PT intervention to increase functional independence with mobility.  Patient progressing toward long term goals..  Continue plan of care. Pt w/o any support upon d/c, will continue to work towards safe d/c back into community.   PT Short Term Goals Week 1:  PT Short Term Goal 1 (Week 1): Pt will demonstrate wc to/from bed/mat w/supervision and safe use of LRAD PT Short Term Goal 1 - Progress (Week 1): Progressing toward goal PT Short Term Goal 2 (Week 1): Gait 220f w/LRAD and cga PT Short Term Goal 2 - Progress (Week 1): Met PT Short Term Goal 3 (Week 1): ascend/descent 3 stairs w/single rail and cga assist PT Short Term Goal 3 - Progress (Week 1): Progressing toward goal Week 2:  PT Short Term Goal 1 (Week 2): =LTGs due to ELOS  Barry Lewis 10/26/2019, 2:18 PM

## 2019-10-25 NOTE — Progress Notes (Addendum)
[facility] PHYSICAL MEDICINE & REHABILITATION PROGRESS NOTE   Subjective/Complaints: Taking rest break with therapy in hallway. Complains of left sided lower back and right sided rib pain.  Moving bowels regularly. Sleeping well at night.   ROS: Patient denies fever, rash, sore throat, blurred vision, nausea, vomiting, diarrhea, cough, shortness of breath or chest pain, headache, or mood change.    Objective:   No results found. No results for input(s): WBC, HGB, HCT, PLT in the last 72 hours. Recent Labs    10/23/19 0450 10/24/19 0853  NA 135 134*  K 4.7 5.4*  CL 95* 98  CO2 25 23  GLUCOSE 135* 173*  BUN 99* 98*  CREATININE 5.30* 5.15*  CALCIUM 8.6* 8.2*    Intake/Output Summary (Last 24 hours) at 10/25/2019 1129 Last data filed at 10/25/2019 1049 Gross per 24 hour  Intake 1620 ml  Output 1580 ml  Net 40 ml     Physical Exam: Vital Signs Blood pressure (!) 186/96, pulse 86, temperature 98.2 F (36.8 C), temperature source Oral, resp. rate 18, height 5\' 6"  (1.676 m), weight 84.1 kg, SpO2 100 %. Constitutional: No distress . Vital signs reviewed. HEENT: EOMI, oral membranes moist Neck: supple Cardiovascular: RRR without murmur. No JVD    Respiratory: CTA Bilaterally without wheezes or rales. Normal effort    GI: BS +, non-tender, non-distended  Musculoskeletal:     Comments: No edema or tenderness in extremities  Tenderness to palpation over left lower back and right sided ribs.  Neurological: He is alert.  Oriented to place person. Fair insight. STM deficits present.  Motor: Bilateral upper extremities: 4+/5 proximal distal right lower extremity: Hip flexion, knee extension 4/5, ankle dorsiflexion 4+/5 Left lower extremity: Hip flexion, knee extension 4 -/5, ankle dorsiflexion 4+/5--motor exam stable. Sensation diminished to light touch bilateral feet. Stable motor/sensory exam Skin:  Eschar, dried blood on right first MT--damp dressing in place. Left knee  incision with foam dressing Psychiatric: generally very pleasant    Assessment/Plan: 1. Functional deficits secondary to encephalopathy, polytrauma which require 3+ hours per day of interdisciplinary therapy in a comprehensive inpatient rehab setting.  Physiatrist is providing close team supervision and 24 hour management of active medical problems listed below.  Physiatrist and rehab team continue to assess barriers to discharge/monitor patient progress toward functional and medical goals  Care Tool:  Bathing    Body parts bathed by patient: Right arm, Left arm, Chest, Abdomen, Front perineal area, Right upper leg, Left upper leg, Right lower leg, Left lower leg, Face, Buttocks   Body parts bathed by helper: Buttocks     Bathing assist Assist Level: Supervision/Verbal cueing     Upper Body Dressing/Undressing Upper body dressing   What is the patient wearing?: Pull over shirt    Upper body assist Assist Level: Supervision/Verbal cueing    Lower Body Dressing/Undressing Lower body dressing      What is the patient wearing?: Pants     Lower body assist Assist for lower body dressing: Contact Guard/Touching assist     Toileting Toileting    Toileting assist Assist for toileting: Supervision/Verbal cueing Assistive Device Comment: urinal   Transfers Chair/bed transfer  Transfers assist     Chair/bed transfer assist level: Contact Guard/Touching assist     Locomotion Ambulation   Ambulation assist   Ambulation activity did not occur: Safety/medical concerns(used cane PTA, unsafe to attempt)  Assist level: Contact Guard/Touching assist Assistive device: Rollator Max distance: 150'   Walk 10 feet  activity   Assist  Walk 10 feet activity did not occur: Safety/medical concerns(unsafe to attempt w/cane)  Assist level: Contact Guard/Touching assist Assistive device: Rollator   Walk 50 feet activity   Assist Walk 50 feet with 2 turns activity did  not occur: Safety/medical concerns  Assist level: Contact Guard/Touching assist Assistive device: Rollator    Walk 150 feet activity   Assist Walk 150 feet activity did not occur: Safety/medical concerns  Assist level: Contact Guard/Touching assist Assistive device: Rollator    Walk 10 feet on uneven surface  activity   Assist Walk 10 feet on uneven surfaces activity did not occur: Safety/medical concerns         Wheelchair     Assist Will patient use wheelchair at discharge?: No             Wheelchair 50 feet with 2 turns activity    Assist            Wheelchair 150 feet activity     Assist          Blood pressure (!) 186/96, pulse 86, temperature 98.2 F (36.8 C), temperature source Oral, resp. rate 18, height 5\' 6"  (1.676 m), weight 84.1 kg, SpO2 100 %.  Medical Problem List and Plan: 1.  Debility/encephalopathy secondary to polytrauma.             -patient may not shower             -ELOS/Goals: 12-16 days/supervision.             -continueCIR therapies including PT and OT and SLP  -  darco shoe for right foot to off load 1st MT head while standing   -mod I goals given lack of supervision at home. 2/10: Ambualted 100 feet with OT. Required min-mod cueing for rollator management during transfers.  2.  Antithrombotics: -DVT/anticoagulation:  SQ Heparin             -antiplatelet therapy: N/A 3. Pain Management/Chronic back pain: Neurontin 300 mg nightly, trazodone 200 mg nightly oxycodone as needed  2/10: Added kpad for lower back and lidocaine patch for right rib pain 4. Mood:/Dementia With history of bipolar disorder.  Effexor 225 mg daily             -antipsychotic agents: Zyprexa 5 mg nightly  -mood seems controlled and is sleeping at present 5. Neuropsych: This patient is capable of making decisions on his own behalf.  2/10: Telesitter may be d/ced 6. Skin/Wound Care:  2/5 -will change to santyl dressing for right 1st MT areas of  eschar/callus   -darco shoe ordered to off load forefoot  2/9 continue santyl right first MT head area 7. Fluids/Electrolytes/Nutrition: Routine in and outs.  CMP ordered. 8.  Acute on chronic renal failure stage IV.  Baseline creatinine 2.2-2.7.  Follow-up renal services.  Continue Lasix 80 mg twice daily.  No current plan for hemodialysis              2/5: Cr holding at 5.23 today--has been steadily rising   -nephrology following, decreased lasix, lokelma rx'ed briefly   -plan was for vein mapping in prep of AVF   -U/O 1350cc yesterday  2/9: Cr down to 5.15 today, renvela started by nephro   -will need outpt follow up   -renal signed off for now. Appreciate their help! 9.  Rhabdomyolysis.  CK trending down. 10.  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  Hemoglobin A1c 8.5.  Lantus insulin  15 units daily.  Check blood sugars before meals and at bedtime.  Diabetic teaching             2/5 sugars remain elevated.   2/9 increased lantus to 18u daily.   -cover sith SSI  2/10: very well controlled.    CBG (last 3)  Recent Labs    10/24/19 1632 10/24/19 2048 10/25/19 0611  GLUCAP 181* 218* 101*      11. Hypertension.Norvasc 10 mg daily,Hydralazine 25 mg every 8 hours             2/9 some increase today. Follow for more consistent pattern   -no changes at present  2/10: Elevated this morning but normotensive at night, continue to monitor.  12.  Combined systolic and diastolic congestive heart failure.  Monitor for any signs of fluid overload.  Continue diuresis             2/5 Daily weights needed. None today  2/8-9 weights stable   Filed Weights   10/23/19 0500 10/24/19 0429 10/25/19 0416  Weight: 84.9 kg 83.8 kg 84.1 kg    13.  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  Continue Prevalon boot.  Follow-up outpatient Dr. Sharol Given to plan for debridement.  No surgical intervention necessary at this  time  -see above #6 14.  Left pleural effusion with history of COPD.  Continue diuresis.  May need thoracentesis.  Follow-up chest x-ray as needed  IS, OOB 15.  Destructive process involving sternum suspect old fracture versus infection.  Latest blood cultures no growth to date.  Radiologist reviewed likely chronic smoldering process.  Follow-up outpatient CVTS.  -rx pain as needed, appears controlled  2/8 likely cause of w/e chest pain.       Appreciate cardiology follow up 16.  T3 as well as T1 and T2 right transverse process fractures.  Conservative care per Dr. Ashok Pall 17.  Right scapular fracture.  Weightbearing as tolerated 18.  Bilateral rib fractures.  Conservative care. 19.  History of polysubstance abuse tobacco and alcohol abuse.  Urine drug screen negative.  Provide counseling 20.  Medical noncompliance.  Provide counseling     LOS: 7 days A FACE TO FACE EVALUATION WAS PERFORMED  Martha Clan P Derak Schurman 10/25/2019, 11:29 AM

## 2019-10-25 NOTE — Progress Notes (Signed)
Occupational Therapy Session Note  Patient Details  Name: Barry Lewis MRN: [id number] Date of Birth: 1961/07/10  Today's Date: 10/25/2019 OT Individual Time: 1115-1200 OT Individual Time Calculation (min): 45 min    Short Term Goals: Week 2:  OT Short Term Goal 1 (Week 2): STG= LTG d/t ELOS  Skilled Therapeutic Interventions/Progress Updates:    Pt received supine in bed agreeable to ADL session. RN notified about IV half out and pt itching head/leg so much it bled onto bed. Pt donned darco boot EOB and used rollator to transfer into bathroom. Pt asking therapist "what's next" several times and OT encouraging independent problem solving/sequencing. Pt doffed all clothing with mild LOB when standing without rollator to doff pants. Pt cued for safety techniques previously covered. Pt's R foot was occluded for shower. Pt completed all bathing with close (S) from TTB, using grab bar for support. Pt using shower head to vigorously item legs/arms- RN aware. Pt FREQUENTLY required cueing for pacing and breathing techniques this session. Extensive edu required re impact of COPD, pacing, and rest breaks. Pt donned socks edge of TTB and required mod cueing for pacing with very poor carryover- pt breathing very labored and hard- however all VSS. Pt returned to recliner and required mod cueing again for rollator management. Pt applied lotion to assist with itching. Shirt donned with (S), pants donned with CGA. Pt cleaned up towels with mod cueing for rollator management and back precaution adherence. Pt was left sitting up in the recliner with all needs met, chair pad alarm set.   Therapy Documentation Precautions:  Precautions Precautions: Fall, Back Precaution Booklet Issued: No Precaution Comments: Reviewed log roll for comfort due to fracture Required Braces or Orthoses: (md note recommends Prevalon boot) Restrictions Weight Bearing Restrictions: No LUE Weight Bearing: Weight bearing as  tolerated RLE Weight Bearing: Weight bearing as tolerated Other Position/Activity Restrictions: Prevalon Boot for wbing  Therapy/Group: Individual Therapy  Curtis Sites 10/25/2019, 9:42 AM

## 2019-10-25 NOTE — Progress Notes (Signed)
Occupational Therapy Weekly Progress Note  Patient Details  Name: Barry Lewis MRN: [id number] Date of Birth: 30-Nov-1960  Beginning of progress report period: October 19, 2019 End of progress report period: October 25, 2019  Today's Date: 10/25/2019 OT Individual Time: 9476-5465 OT Individual Time Calculation (min): 45 min    Patient has met 2 of 3 short term goals.  Pt has made good progress toward his Ot goals this reporting period. Pt is able to complete UB ADLs at (S) level and LB ADLs with CGA. IADLs are completed at min A- CGA level and these goals have been upgraded to reflect lack of supervision available at home. Extensive problem solving/focus on fall prevention/recovery and IADL completion at home. Pt requires min-mod cueing for rollator management during transfers.   Patient continues to demonstrate the following deficits: muscle weakness, decreased cardiorespiratoy endurance and decreased standing balance and decreased balance strategies and therefore will continue to benefit from skilled OT intervention to enhance overall performance with BADL and iADL.  Patient progressing toward long term goals..  Plan of care revisions: Several IADL goals upgraded to mod I to reflect lack of supervision at home.  OT Short Term Goals Week 1:  OT Short Term Goal 1 (Week 1): Pt will complete toilet transfer/toietling wiht S OT Short Term Goal 1 - Progress (Week 1): Progressing toward goal OT Short Term Goal 2 (Week 1): Pt will don B socks wiht AE PRN OT Short Term Goal 2 - Progress (Week 1): Met OT Short Term Goal 3 (Week 1): pt will thread BLE into pants with S OT Short Term Goal 4 (Week 1): Pt will groom in standing with S OT Short Term Goal 4 - Progress (Week 1): Met Week 2:  OT Short Term Goal 1 (Week 2): STG= LTG d/t ELOS  Skilled Therapeutic Interventions/Progress Updates:    Pt received supine c/o being hungry and requesting a snack. Pt provided with a yogurt and 1 pack of graham  crackers. Pt sat EOB and quickly ate his snack and then donned socks and darco boot with min cueing. Pt completed toileting tasks in standing with min cueing for rollator management but CGA overall. Pt completed 100 ft of functional mobility before requiring a seated rest break. Pt completed another 50 ft to the therapy gym with CGA. Pt completed 3x 5 sit <> stands with 1 kg medicine ball held at his chest to reduce UE reliance. Pt required rest breaks between each trial. Vitals monitored throughout session and all WNL. CGA required for balance support and cueing for pacing/breathing techniques. Pt still requires min-mod cueing for rollator management. Pt returned to his room and was provided with a heat pack for his lower back. Pt passed off to SLP in room.   Therapy Documentation Precautions:  Precautions Precautions: Fall, Back Precaution Booklet Issued: No Precaution Comments: Reviewed log roll for comfort due to fracture Required Braces or Orthoses: (md note recommends Prevalon boot) Restrictions Weight Bearing Restrictions: No LUE Weight Bearing: Weight bearing as tolerated RLE Weight Bearing: Weight bearing as tolerated Other Position/Activity Restrictions: Prevalon Boot for wbing   Therapy/Group: Individual Therapy  Curtis Sites 10/25/2019, 6:58 AM

## 2019-10-25 NOTE — Progress Notes (Signed)
Speech Language Pathology Daily Session Note  Patient Details  Name: Barry Lewis MRN: [id number] Date of Birth: August 28, 1961  Today's Date: 10/25/2019 SLP Individual Time: 0915-0955 SLP Individual Time Calculation (min): 40 min  Short Term Goals: Week 1: SLP Short Term Goal 1 (Week 1): Patient will recall new, daily information with Max A multimodal cues for use of external aids. SLP Short Term Goal 2 (Week 1): Patient will demonstrate functional problem solving for basic and familiar tasks with Min A verbal cues. SLP Short Term Goal 3 (Week 1): Patient will self-monitor and correct errors during functional tasks with Mod A verbal cues. SLP Short Term Goal 4 (Week 1): Patient will demonstrate sustained attention to tasks for 30 minutes with Min A verbal cues for redirection.  Skilled Therapeutic Interventions: Skilled treatment session focused on cognitive goals. SLP facilitated session by providing an external aid to maximize recall with sequencing of steps for safety with the rollator. SLP also provided a notebook for patient to utilize to record events from previous therapy sessions or questions he may have for the treatment team. Patient verbalized understanding. Patient required Max A verbal cues for anticipatory awareness in regards to why it is unsafe to drive at discharge. Patient transferred back to bed at end of session per his request. Patient left supine in bed with alarm on and all needs within reach. Continue with current plan of care.      Pain No/Denies Pain   Therapy/Group: Individual Therapy  Chancellor Vanderloop 10/25/2019, 2:05 PM

## 2019-10-26 ENCOUNTER — Inpatient Hospital Stay (HOSPITAL_COMMUNITY): Payer: Medicare Other | Admitting: Physical Therapy

## 2019-10-26 ENCOUNTER — Inpatient Hospital Stay (HOSPITAL_COMMUNITY): Payer: Medicare Other

## 2019-10-26 ENCOUNTER — Inpatient Hospital Stay (HOSPITAL_COMMUNITY): Payer: Medicare Other | Admitting: Speech Pathology

## 2019-10-26 LAB — RENAL FUNCTION PANEL
Albumin: 2.2 g/dL — ABNORMAL LOW (ref 3.5–5.0)
Anion gap: 17 — ABNORMAL HIGH (ref 5–15)
BUN: 95 mg/dL — ABNORMAL HIGH (ref 6–20)
CO2: 22 mmol/L (ref 22–32)
Calcium: 8.8 mg/dL — ABNORMAL LOW (ref 8.9–10.3)
Chloride: 97 mmol/L — ABNORMAL LOW (ref 98–111)
Creatinine, Ser: 5.57 mg/dL — ABNORMAL HIGH (ref 0.61–1.24)
GFR calc Af Amer: 12 mL/min — ABNORMAL LOW (ref >60)
GFR calc non Af Amer: 11 mL/min — ABNORMAL LOW (ref >60)
Glucose, Bld: 124 mg/dL — ABNORMAL HIGH (ref 70–99)
Phosphorus: 6.8 mg/dL — ABNORMAL HIGH (ref 2.5–4.6)
Potassium: 4.9 mmol/L (ref 3.5–5.1)
Sodium: 136 mmol/L (ref 135–145)

## 2019-10-26 LAB — GLUCOSE, CAPILLARY
Glucose-Capillary: 114 mg/dL — ABNORMAL HIGH (ref 70–99)
Glucose-Capillary: 361 mg/dL — ABNORMAL HIGH (ref 70–99)
Glucose-Capillary: 139 mg/dL — ABNORMAL HIGH (ref 70–99)
Glucose-Capillary: 190 mg/dL — ABNORMAL HIGH (ref 70–99)

## 2019-10-26 LAB — ANA W/REFLEX IF POSITIVE: Anti Nuclear Antibody (ANA): NEGATIVE

## 2019-10-26 MED ORDER — LIDOCAINE 5 % EX PTCH
2.0000 | MEDICATED_PATCH | CUTANEOUS | Status: DC
  Administered 2019-10-27 – 2019-11-02 (×7): 2 via TRANSDERMAL
  Filled 2019-10-26 (×7): qty 2

## 2019-10-26 MED ORDER — CARVEDILOL 3.125 MG PO TABS
3.1250 mg | ORAL_TABLET | Freq: Two times a day (BID) | ORAL | Status: DC
  Administered 2019-10-26 – 2019-10-29 (×7): 3.125 mg via ORAL
  Filled 2019-10-26 (×7): qty 1

## 2019-10-26 MED ORDER — FUROSEMIDE 40 MG PO TABS
40.0000 mg | ORAL_TABLET | Freq: Every day | ORAL | Status: DC
  Administered 2019-10-27 – 2019-10-28 (×2): 40 mg via ORAL
  Filled 2019-10-26 (×2): qty 1

## 2019-10-26 MED ORDER — COLLAGENASE 250 UNIT/GM EX OINT
TOPICAL_OINTMENT | Freq: Two times a day (BID) | CUTANEOUS | Status: DC
  Filled 2019-10-26: qty 30

## 2019-10-26 NOTE — Progress Notes (Signed)
Speech Language Pathology Weekly Progress and Session Note  Patient Details  Name: Barry Lewis MRN: [id number] Date of Birth: 01/10/1961  Beginning of progress report period: October 19, 2019 End of progress report period: October 26, 2019  Today's Date: 10/26/2019 SLP Individual Time: 0830-0925 SLP Individual Time Calculation (min): 55 min  Short Term Goals: Week 1: SLP Short Term Goal 1 (Week 1): Patient will recall new, daily information with Max A multimodal cues for use of external aids. SLP Short Term Goal 1 - Progress (Week 1): Met SLP Short Term Goal 2 (Week 1): Patient will demonstrate functional problem solving for basic and familiar tasks with Min A verbal cues. SLP Short Term Goal 2 - Progress (Week 1): Met SLP Short Term Goal 3 (Week 1): Patient will self-monitor and correct errors during functional tasks with Mod A verbal cues. SLP Short Term Goal 3 - Progress (Week 1): Met SLP Short Term Goal 4 (Week 1): Patient will demonstrate sustained attention to tasks for 30 minutes with Min A verbal cues for redirection. SLP Short Term Goal 4 - Progress (Week 1): Met    New Short Term Goals: Week 2: SLP Short Term Goal 1 (Week 2): STGs=LTGs due to ELOS  Weekly Progress Updates: Patient has made functional gains and has met 4 of 4 STGs this reporting period. Currently, patient continues to demonstrate severe memory impairments but increased ability to utilize compensatory strategies to maximize recall of functional information. Patient requires overall Mod-Max A multimodal cues to recall daily information and emergent awareness of errors and overall Min A verbal cues for sustained attention and problem solving with basic and familiar tasks. Patient and family education ongoing. Patient would benefit from continued skilled SLP intervention to maximize his cognitive function and overall functional independence prior to discharge.      Intensity: Minumum of 1-2 x/day, 30 to 90  minutes Frequency: 3 to 5 out of 7 days Duration/Length of Stay: 11/02/19 Treatment/Interventions: Cognitive remediation/compensation;Cueing hierarchy;Environmental controls;Functional tasks;Patient/family education;Internal/external aids;Therapeutic Activities   Daily Session  Skilled Therapeutic Interventions: Skilled treatment session focused on cognitive goals. Upon arrival, patient appeared frustrated due to being hungry despite already eating breakfast. Patient was on the phone attempting to order another tray and attempting to void while utilizing the urinal at the same time requiring Max verbal cues for problem solving. Patient also attempted to transfer to the recliner without proper footwear and with his right PRAFO on resulting in almost falling off the bed. Max verbal cues were needed for safety with task. Patient aplogized to the clinician for unsafe behavior and being "terrible" because he was hungry. SLP also facilitated session by providing a yearly calendar to maximize recall of appointments after discharge and provided Min verbal cues for use of external aids. Patient left upright in recliner with alarm on and all needs within reach. Continue with current plan of care.     Pain No/Denies Pain   Therapy/Group: Individual Therapy  ,  10/26/2019, 2:04 PM         

## 2019-10-26 NOTE — Progress Notes (Signed)
[facility] Progress Note    Assessment/ Plan:   1. AKI on CKD IV: Baseline 2.2-2.7.  Consistent with nephrotic syndrome, suspect diabetic nephropathy.  ANA pending, otherwise serological work-up unremarkable. Repeat HCV RNA pending. Cr 5.57, relatively stable but increased, will decrease lasix to 40mg  daily.    2. Hypertension: SBP 160-180s. Add Coreg 3.125 mg BID. Cont hydralazine 100 mg TID and Norvasc.  3. Hyperkalemia: Resolved, K 4.9. Monitor for now.   4. Secondary hyperparathyroidism: PTH 121, Ca 10.3, Phos 6.3>6.8. Cont Renvela TID w/ meals.   5. Anemia of CKD: Hgb 10.2 on 2/4. Iron sat 26. Will Monitor.   6. Hyponatremia: Resolved. Continue with Lasix and fluid restriction.     7. Nutrition: Albumin 2.2.  Ensure enlive BID.   8. 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  9. IDDM: On insulin, per primary.   Disposition: Will schedule for outpatient Nephrology prior to discharge and follow pending serologies.   Subjective:   No acute events. Doing well.    Objective:   BP (!) 174/95 (BP Location: Left Arm)   Pulse 83   Temp 97.6 F (36.4 C)   Resp 16   Ht 5\' 6"  (1.676 m)   Wt 83.2 kg   SpO2 96%   BMI 29.61 kg/m   Intake/Output Summary (Last 24 hours) at 10/26/2019 0825 Last data filed at 10/26/2019 0759 Gross per 24 hour  Intake 1160 ml  Output 1745 ml  Net -585 ml   Weight change: -0.9 kg  Physical Exam: General: Alert, NAD HEENT: NCAT Cardiac: RRR  Lungs: Clear bilaterally, no increased WOB  Abdomen: soft, non-tender Msk: Moves all extremities spontaneously  Ext: Warm, dry, 2+ distal pulses, 1+ pitting edema to mid shin     Imaging: No results found.  Labs: BMET Recent Labs  Lab 10/20/19 0955 10/20/19 0955 10/20/19 1934 10/21/19 1211 10/22/19 0101 10/23/19 0450 10/24/19 0853 10/25/19 1040 10/26/19 0613  NA 129*   < > 130* 130* 128* 135 134* 134* 136  K 5.1   < > 5.2* 5.1 5.4* 4.7 5.4* 5.2*  4.9  CL 94*   < > 96* 95* 96* 95* 98 97* 97*  CO2 21*   < > 22 20* 20* 25 23 22 22   GLUCOSE 240*   < > 128* 233* 166* 135* 173* 252* 124*  BUN 82*   < > 90* 91* 95* 99* 98* 96* 95*  CREATININE 5.23*   < > 5.53* 5.26* 5.28* 5.30* 5.15* 5.23* 5.57*  CALCIUM 8.0*   < > 8.0* 8.3*  7.8* 7.5* 8.6* 8.2* 8.5* 8.8*  PHOS 6.6*  --   --  6.7* 6.6* 6.4* 5.7* 6.3* 6.8*   < > = values in this interval not displayed.   CBC No results for input(s): WBC, NEUTROABS, HGB, HCT, MCV, PLT in the last 168 hours.  Medications:    . amLODipine  10 mg Oral Daily  . collagenase   Topical Daily  . docusate sodium  100 mg Oral BID  . feeding supplement (ENSURE ENLIVE)  237 mL Oral BID BM  . ferrous sulfate  325 mg Oral Q breakfast  . fluticasone  1 spray Each Nare Daily  . folic acid  1 mg Oral Daily  . furosemide  80 mg Oral Daily  . gabapentin  300 mg Oral QHS  . heparin  5,000 Units Subcutaneous Q8H  . hydrALAZINE  100 mg Oral Q8H  . hydrocerin   Topical  BID  . insulin aspart  0-15 Units Subcutaneous TID WC  . insulin glargine  18 Units Subcutaneous Daily  . lidocaine  1 patch Transdermal Q24H  . OLANZapine  5 mg Oral QHS  . polyethylene glycol  17 g Oral Daily  . sevelamer carbonate  1,600 mg Oral TID WC  . sodium bicarbonate  1,300 mg Oral BID  . thiamine  100 mg Oral Daily  . traZODone  200 mg Oral QHS  . venlafaxine XR  225 mg Oral Q breakfast      Darrelyn Hillock, DO  Family Medicine PGY-2  10/26/2019, 8:25 AM

## 2019-10-26 NOTE — Progress Notes (Signed)
Physical Therapy Session Note  Patient Details  Name: Barry Lewis MRN: [id number] Date of Birth: November 14, 1960  Today's Date: 10/26/2019 PT Missed Time: 80 Minutes Missed Time Reason: Patient fatigue;Patient unwilling to participate  Pt asleep in supine, needs max cues to wake up, and did not wake up fully to open his eyes. Stated he was not having a good day, "I don't know what's wrong with me". States he feels dizzy. Vitals were just checked by NT and WNL. Pt refused any activity this session 2/2 fatigue. Missed 60 min of skilled PT.   Marcial Pless Clent Demark 10/26/2019, 2:16 PM

## 2019-10-26 NOTE — Progress Notes (Signed)
Occupational Therapy Session Note  Patient Details  Name: Barry Lewis MRN: [id number] Date of Birth: Aug 08, 1961  Today's Date: 10/26/2019 OT Individual Time: [id number] OT Individual Time Calculation (min): 70 min    Short Term Goals: Week 2:  OT Short Term Goal 1 (Week 2): STG= LTG d/t ELOS  Skilled Therapeutic Interventions/Progress Updates:    1:1. Pt received in recliner reporting pain in back but not rating. Pt declines intervention. Pt reporting cold and blanket provided. Pt completes ambulation to bathroom for voiding bladder in standing 2x with close supervision and VC for brake management. Pt attempted to impulsively walk to bathroom without use of RW. OT educated on fall risk and need for AD. Pt completes hand hygiene in standing with S. In tx gym pt completes seated beach ball volley with 3# dowel rod 1 min on/1 min rest 3 trials to improve UB strength and endurance required for BADLs. Pt reporting very cold and agreeable to bathing to warm up. Pt completes ambulatory transfer with rolator into and out of shower with VC for brake managemetn. Pt requires VC for pacing and crossing into seated figure 4 to maintain back precautions.   Therapy Documentation Precautions:  Precautions Precautions: Fall, Back Precaution Booklet Issued: No Precaution Comments: Reviewed log roll for comfort due to fracture Required Braces or Orthoses: (md note recommends Prevalon boot) Restrictions Weight Bearing Restrictions: No LUE Weight Bearing: Weight bearing as tolerated RLE Weight Bearing: Weight bearing as tolerated Other Position/Activity Restrictions: Actor for wbing General:   Vital Signs:  Pain: Pain Assessment Pain Scale: 0-10 Pain Score: 8  Pain Type: Acute pain Pain Location: Rib cage Pain Orientation: Right;Mid Pain Descriptors / Indicators: Aching Pain Onset: On-going Patients Stated Pain Goal: 4 Pain Intervention(s): Medication (See eMAR)(oxycodone given) ADL:    Vision   Perception    Praxis   Exercises:   Other Treatments:     Therapy/Group: Individual Therapy  Tonny Branch 10/26/2019, 11:09 AM

## 2019-10-26 NOTE — Progress Notes (Signed)
[facility] PHYSICAL MEDICINE & REHABILITATION PROGRESS NOTE   Subjective/Complaints: Complains of ongoing chest wall and sternal pain on right. Is taking 10mg  oxycodone q4 prn around the clock. " I need something more"  ROS: Patient denies fever, rash, sore throat, blurred vision, nausea, vomiting, diarrhea, cough, shortness of breath   headache, or mood change.      Objective:   No results found. No results for input(s): WBC, HGB, HCT, PLT in the last 72 hours. Recent Labs    10/25/19 1040 10/26/19 0613  NA 134* 136  K 5.2* 4.9  CL 97* 97*  CO2 22 22  GLUCOSE 252* 124*  BUN 96* 95*  CREATININE 5.23* 5.57*  CALCIUM 8.5* 8.8*    Intake/Output Summary (Last 24 hours) at 10/26/2019 1035 Last data filed at 10/26/2019 1002 Gross per 24 hour  Intake 1160 ml  Output 2345 ml  Net -1185 ml     Physical Exam: Vital Signs Blood pressure (!) 174/95, pulse 83, temperature 97.6 F (36.4 C), resp. rate 16, height 5\' 6"  (1.676 m), weight 83.2 kg, SpO2 96 %. Constitutional: No distress . Vital signs reviewed. HEENT: EOMI, oral membranes moist Neck: supple Cardiovascular: RRR without murmur. No JVD    Respiratory: CTA Bilaterally without wheezes or rales. Normal effort    GI: BS +, non-tender, non-distended  Musculoskeletal:     Comments: tender over right chest wall to sternum    Neurological: He is alert.  Oriented to place person. Fair insight. STM deficits present.  Motor: Bilateral upper extremities: 4+/5 proximal distal right lower extremity: Hip flexion, knee extension 4/5, ankle dorsiflexion 4+/5 Left lower extremity: Hip flexion, knee extension 4 -/5, ankle dorsiflexion 4+/5. decr PP in both feet. Skin:  Eschar, dried blood on right first MT--still quite dry. A little cleaner Psychiatric: anxious    Assessment/Plan: 1. Functional deficits secondary to encephalopathy, polytrauma which require 3+ hours per day of interdisciplinary therapy in a comprehensive inpatient  rehab setting.  Physiatrist is providing close team supervision and 24 hour management of active medical problems listed below.  Physiatrist and rehab team continue to assess barriers to discharge/monitor patient progress toward functional and medical goals  Care Tool:  Bathing    Body parts bathed by patient: Right arm, Left arm, Chest, Abdomen, Front perineal area, Right upper leg, Left upper leg, Right lower leg, Left lower leg, Face, Buttocks   Body parts bathed by helper: Buttocks     Bathing assist Assist Level: Supervision/Verbal cueing     Upper Body Dressing/Undressing Upper body dressing   What is the patient wearing?: Pull over shirt    Upper body assist Assist Level: Supervision/Verbal cueing    Lower Body Dressing/Undressing Lower body dressing      What is the patient wearing?: Pants     Lower body assist Assist for lower body dressing: Contact Guard/Touching assist     Toileting Toileting    Toileting assist Assist for toileting: Supervision/Verbal cueing Assistive Device Comment: urinal   Transfers Chair/bed transfer  Transfers assist     Chair/bed transfer assist level: Contact Guard/Touching assist     Locomotion Ambulation   Ambulation assist   Ambulation activity did not occur: Safety/medical concerns(used cane PTA, unsafe to attempt)  Assist level: Contact Guard/Touching assist Assistive device: Rollator Max distance: 150'   Walk 10 feet activity   Assist  Walk 10 feet activity did not occur: Safety/medical concerns(unsafe to attempt w/cane)  Assist level: Contact Guard/Touching assist Assistive device: Rollator  Walk 50 feet activity   Assist Walk 50 feet with 2 turns activity did not occur: Safety/medical concerns  Assist level: Contact Guard/Touching assist Assistive device: Rollator    Walk 150 feet activity   Assist Walk 150 feet activity did not occur: Safety/medical concerns  Assist level: Contact  Guard/Touching assist Assistive device: Rollator    Walk 10 feet on uneven surface  activity   Assist Walk 10 feet on uneven surfaces activity did not occur: Safety/medical concerns         Wheelchair     Assist Will patient use wheelchair at discharge?: No             Wheelchair 50 feet with 2 turns activity    Assist            Wheelchair 150 feet activity     Assist          Blood pressure (!) 174/95, pulse 83, temperature 97.6 F (36.4 C), resp. rate 16, height 5\' 6"  (1.676 m), weight 83.2 kg, SpO2 96 %.  Medical Problem List and Plan: 1.  Debility/encephalopathy secondary to polytrauma.             -patient may not shower             -ELOS/Goals: 12-16 days/supervision.             -continueCIR therapies including PT and OT and SLP  -  darco shoe for right foot to off load 1st MT head while standing   -mod I goals given lack of supervision at home.   2/11 still needing cueing for safety 2.  Antithrombotics: -DVT/anticoagulation:  SQ Heparin             -antiplatelet therapy: N/A 3. Pain Management/Chronic back pain: Neurontin 300 mg nightly, trazodone 200 mg nightly oxycodone as needed   2/10: Added kpad for lower back and lidocaine patch for right rib pain  2/11 will increase lidocaine patches to 2. Not anxious to increase oxycodone further given cognition, total dose, etc 4. Mood:/Dementia With history of bipolar disorder.  Effexor 225 mg daily             -antipsychotic agents: Zyprexa 5 mg nightly  -anxiety likely a factor in pain. 5. Neuropsych: This patient is capable of making decisions on his own behalf.  2/10: Telesitter may be d/ced 6. Skin/Wound Care:  2/5 -will change to santyl dressing for right 1st MT areas of eschar/callus   -darco shoe ordered to off load forefoot  2/11 increase santyl to bid 7. Fluids/Electrolytes/Nutrition: Routine in and outs.  CMP ordered. 8.  Acute on chronic renal failure stage IV.  Baseline  creatinine 2.2-2.7.  Follow-up renal services.  Continue Lasix 40mg  daily.  No current plan for hemodialysis              2/5: Cr holding at 5.23 today--has been steadily rising   -nephrology following, decreased lasix, lokelma rx'ed briefly   -plan was for vein mapping in prep of AVF   -U/O 1350cc yesterday  2/11 Cr back up to 5.57   -recheck Monday 9.  Rhabdomyolysis.  CK trending down. 10.  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  Hemoglobin A1c 8.5.  Lantus insulin 15 units daily.  Check blood sugars before meals and at bedtime.  Diabetic teaching             2/5 sugars remain elevated.   2/9 increased lantus to 18u daily.   -cover  sith SSI  2/11 fair control---follow for further pattern    CBG (last 3)  Recent Labs    10/25/19 1626 10/25/19 2104 10/26/19 0626  GLUCAP 189* 267* 114*      11. Hypertension.Norvasc 10 mg daily,Hydralazine 25 mg every 8 hours             2/9 some increase today. Follow for more consistent pattern   -no changes at present  2/11 elevation likely driven by pain and anxiety 12.  Combined systolic and diastolic congestive heart failure.  Monitor for any signs of fluid overload.  Continue diuresis             2/5 Daily weights needed. None today  2/8-9 weights stable   Filed Weights   10/24/19 0429 10/25/19 0416 10/26/19 0628  Weight: 83.8 kg 84.1 kg 83.2 kg    13.  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  Continue Prevalon boot.  Follow-up outpatient Dr. Sharol Given to plan for debridement.  No surgical intervention necessary at this time  -see above #6 14.  Left pleural effusion with history of COPD.  Continue diuresis.  May need thoracentesis.  Follow-up chest x-ray as needed  IS, OOB 15.  Destructive process involving sternum suspect old fracture versus infection.  Latest blood cultures no growth to date.  Radiologist reviewed likely chronic smoldering process.   Follow-up outpatient CVTS.  -rx pain as needed, appears controlled  2/8 likely cause of w/e chest pain.       Appreciate cardiology follow up 16.  T3 as well as T1 and T2 right transverse process fractures.  Conservative care per Dr. Ashok Pall 17.  Right scapular fracture.  Weightbearing as tolerated 18.  Bilateral rib fractures.  Conservative care. 19.  History of polysubstance abuse tobacco and alcohol abuse.  Urine drug screen negative.  Provide counseling 20.  Medical noncompliance.  Provide counseling     LOS: 8 days A FACE TO FACE EVALUATION WAS PERFORMED  Meredith Staggers 10/26/2019, 10:35 AM

## 2019-10-27 ENCOUNTER — Inpatient Hospital Stay (HOSPITAL_COMMUNITY): Payer: Medicare Other | Admitting: Speech Pathology

## 2019-10-27 ENCOUNTER — Inpatient Hospital Stay (HOSPITAL_COMMUNITY): Payer: Medicare Other

## 2019-10-27 LAB — RENAL FUNCTION PANEL
Albumin: 2.1 g/dL — ABNORMAL LOW (ref 3.5–5.0)
Anion gap: 14 (ref 5–15)
BUN: 98 mg/dL — ABNORMAL HIGH (ref 6–20)
CO2: 22 mmol/L (ref 22–32)
Calcium: 8.6 mg/dL — ABNORMAL LOW (ref 8.9–10.3)
Chloride: 99 mmol/L (ref 98–111)
Creatinine, Ser: 5.82 mg/dL — ABNORMAL HIGH (ref 0.61–1.24)
GFR calc Af Amer: 12 mL/min — ABNORMAL LOW (ref >60)
GFR calc non Af Amer: 10 mL/min — ABNORMAL LOW (ref >60)
Glucose, Bld: 173 mg/dL — ABNORMAL HIGH (ref 70–99)
Phosphorus: 6.5 mg/dL — ABNORMAL HIGH (ref 2.5–4.6)
Potassium: 5.2 mmol/L — ABNORMAL HIGH (ref 3.5–5.1)
Sodium: 135 mmol/L (ref 135–145)

## 2019-10-27 LAB — GLUCOSE, CAPILLARY
Glucose-Capillary: 151 mg/dL — ABNORMAL HIGH (ref 70–99)
Glucose-Capillary: 264 mg/dL — ABNORMAL HIGH (ref 70–99)
Glucose-Capillary: 218 mg/dL — ABNORMAL HIGH (ref 70–99)
Glucose-Capillary: 150 mg/dL — ABNORMAL HIGH (ref 70–99)

## 2019-10-27 MED ORDER — SODIUM ZIRCONIUM CYCLOSILICATE 10 G PO PACK
10.0000 g | PACK | Freq: Once | ORAL | Status: AC
  Administered 2019-10-27: 12:00:00 10 g via ORAL
  Filled 2019-10-27: qty 1

## 2019-10-27 MED ORDER — ALBUMIN HUMAN 25 % IV SOLN
25.0000 g | Freq: Once | INTRAVENOUS | Status: AC
  Administered 2019-10-27: 16:00:00 12.5 g via INTRAVENOUS
  Filled 2019-10-27: qty 100

## 2019-10-27 MED ORDER — NEPRO/CARBSTEADY PO LIQD
237.0000 mL | Freq: Two times a day (BID) | ORAL | Status: DC
  Administered 2019-10-27 – 2019-11-03 (×15): 237 mL via ORAL

## 2019-10-27 MED ORDER — CALCIUM ACETATE (PHOS BINDER) 667 MG PO CAPS
1334.0000 mg | ORAL_CAPSULE | Freq: Three times a day (TID) | ORAL | Status: DC
  Administered 2019-10-27 – 2019-11-03 (×21): 1334 mg via ORAL
  Filled 2019-10-27 (×21): qty 2

## 2019-10-27 NOTE — Progress Notes (Signed)
[facility] PHYSICAL MEDICINE & REHABILITATION PROGRESS NOTE   Subjective/Complaints: Slept well. Says pain is much better controlled this morning. 2 lidoderm patches help chest wall pain.   ROS: Patient denies fever, rash, sore throat, blurred vision, nausea, vomiting, diarrhea, cough, shortness of breath or chest pain,   headache, or mood change.     Objective:   No results found. No results for input(s): WBC, HGB, HCT, PLT in the last 72 hours. Recent Labs    10/26/19 0613 10/27/19 0829  NA 136 135  K 4.9 5.2*  CL 97* 99  CO2 22 22  GLUCOSE 124* 173*  BUN 95* 98*  CREATININE 5.57* 5.82*  CALCIUM 8.8* 8.6*    Intake/Output Summary (Last 24 hours) at 10/27/2019 1110 Last data filed at 10/27/2019 0900 Gross per 24 hour  Intake 1139 ml  Output 1920 ml  Net -781 ml     Physical Exam: Vital Signs Blood pressure (!) 180/94, pulse 87, temperature 98 F (36.7 C), temperature source Oral, resp. rate 16, height 5\' 6"  (1.676 m), weight 83.2 kg, SpO2 99 %. Constitutional: No distress . Vital signs reviewed. HEENT: EOMI, oral membranes moist Neck: supple Cardiovascular: RRR without murmur. No JVD    Respiratory: CTA Bilaterally without wheezes or rales. Normal effort    GI: BS +, non-tender, non-distended  Musculoskeletal:     Comments: tender over right chest wall/axillary line, sternum less tender    Neurological: He is alert.  Oriented to place person. Fair insight. STM deficits present.  Motor: Bilateral upper extremities: 4+/5 proximal distal right lower extremity: Hip flexion, knee extension 4/5, ankle dorsiflexion 4+/5 Left lower extremity: Hip flexion, knee extension 4 -/5, ankle dorsiflexion 4+/5. Decreased LT in both feet.  Skin:  Eschar a little looser on right first MT  Psychiatric:pleasant and in good spirits.     Assessment/Plan: 1. Functional deficits secondary to encephalopathy, polytrauma which require 3+ hours per day of interdisciplinary therapy in a  comprehensive inpatient rehab setting.  Physiatrist is providing close team supervision and 24 hour management of active medical problems listed below.  Physiatrist and rehab team continue to assess barriers to discharge/monitor patient progress toward functional and medical goals  Care Tool:  Bathing    Body parts bathed by patient: Right arm, Left arm, Chest, Abdomen, Front perineal area, Right upper leg, Left upper leg, Right lower leg, Left lower leg, Face, Buttocks   Body parts bathed by helper: Buttocks     Bathing assist Assist Level: Supervision/Verbal cueing     Upper Body Dressing/Undressing Upper body dressing   What is the patient wearing?: Pull over shirt    Upper body assist Assist Level: Supervision/Verbal cueing    Lower Body Dressing/Undressing Lower body dressing      What is the patient wearing?: Pants     Lower body assist Assist for lower body dressing: Contact Guard/Touching assist     Toileting Toileting    Toileting assist Assist for toileting: Supervision/Verbal cueing Assistive Device Comment: urinal   Transfers Chair/bed transfer  Transfers assist     Chair/bed transfer assist level: Contact Guard/Touching assist     Locomotion Ambulation   Ambulation assist   Ambulation activity did not occur: Safety/medical concerns(used cane PTA, unsafe to attempt)  Assist level: Contact Guard/Touching assist Assistive device: Rollator Max distance: 150'   Walk 10 feet activity   Assist  Walk 10 feet activity did not occur: Safety/medical concerns(unsafe to attempt w/cane)  Assist level: Contact Guard/Touching assist Assistive  device: Rollator   Walk 50 feet activity   Assist Walk 50 feet with 2 turns activity did not occur: Safety/medical concerns  Assist level: Contact Guard/Touching assist Assistive device: Rollator    Walk 150 feet activity   Assist Walk 150 feet activity did not occur: Safety/medical  concerns  Assist level: Contact Guard/Touching assist Assistive device: Rollator    Walk 10 feet on uneven surface  activity   Assist Walk 10 feet on uneven surfaces activity did not occur: Safety/medical concerns         Wheelchair     Assist Will patient use wheelchair at discharge?: No             Wheelchair 50 feet with 2 turns activity    Assist            Wheelchair 150 feet activity     Assist          Blood pressure (!) 180/94, pulse 87, temperature 98 F (36.7 C), temperature source Oral, resp. rate 16, height 5\' 6"  (1.676 m), weight 83.2 kg, SpO2 99 %.  Medical Problem List and Plan: 1.  Debility/encephalopathy secondary to polytrauma.             -patient may not shower             -ELOS/Goals: 12-16 days/supervision.             -continueCIR therapies including PT and OT and SLP  -  darco shoe for right foot to off load 1st MT head while standing   -mod I goals given lack of supervision at home.     2.  Antithrombotics: -DVT/anticoagulation:  SQ Heparin             -antiplatelet therapy: N/A 3. Pain Management/Chronic back pain: Neurontin 300 mg nightly, trazodone 200 mg nightly oxycodone as needed   2/10: Added kpad for lower back and lidocaine patch for right rib pain  2/11 will increase lidocaine patches to 2 daily. Not anxious to increase oxycodone further given cognition, total dose, etc  2/12 pain better controlled today 4. Mood:/Dementia With history of bipolar disorder.  Effexor 225 mg daily             -antipsychotic agents: Zyprexa 5 mg nightly  -anxiety likely a factor in pain. 5. Neuropsych: This patient is capable of making decisions on his own behalf.  2/10: Telesitter may be d/ced 6. Skin/Wound Care:  2/5 -will change to santyl dressing for right 1st MT areas of eschar/callus   -darco shoe ordered to off load forefoot  2/11 increased santyl to bid--monitor 7. Fluids/Electrolytes/Nutrition: Routine in and outs.   CMP ordered. 8.  Acute on chronic renal failure stage IV.  Baseline creatinine 2.2-2.7.  Follow-up renal services.  Continue Lasix 40mg  daily.  No current plan for hemodialysis              2/5: Cr holding at 5.23 today--has been steadily rising   -nephrology following, decreased lasix, lokelma rx'ed briefly   -plan was for vein mapping in prep of AVF   -U/O 1350cc yesterday  2/12 Cr up to 5.8 today, slowly trending up, K is 5.2   -nephrology is apparently still following patient   -will defer to Dr. Posey Pronto re: any changes in regimen 9.  Rhabdomyolysis.  CK trending down. 10.  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  Hemoglobin A1c 8.5.  Lantus insulin 15 units daily.  Check blood sugars before meals and  at bedtime.  Diabetic teaching             2/5 sugars remain elevated.   2/9 increased lantus to 18u daily.   -cover sith SSI  2/12 fair control---follow for further pattern    CBG (last 3)  Recent Labs    10/26/19 1627 10/26/19 2107 10/27/19 0617  GLUCAP 139* 190* 151*      11. Hypertension.Norvasc 10 mg daily,Hydralazine 25 mg every 8 hours             2/9 some increase today. Follow for more consistent pattern   -no changes at present  2/12 intermittent elevation often d/t pain/anxiety 12.  Combined systolic and diastolic congestive heart failure.  Monitor for any signs of fluid overload.  Continue diuresis             2/5 Daily weights needed. None today  2/8-12 weights stable   Filed Weights   10/24/19 0429 10/25/19 0416 10/26/19 0628  Weight: 83.8 kg 84.1 kg 83.2 kg    13.  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  Continue Prevalon boot.  Follow-up outpatient Dr. Sharol Given to plan for debridement.  No surgical intervention necessary at this time  -see above #6 14.  Left pleural effusion with history of COPD.  Continue diuresis.  May need thoracentesis.  Follow-up chest x-ray as  needed  IS, OOB 15.  Destructive process involving sternum suspect old fracture versus infection.  Latest blood cultures no growth to date.  Radiologist reviewed likely chronic smoldering process.  Follow-up outpatient CVTS.  -rx pain as needed, appears controlled  2/8 likely cause of w/e chest pain.       Appreciate cardiology follow up 16.  T3 as well as T1 and T2 right transverse process fractures.  Conservative care per Dr. Ashok Pall 17.  Right scapular fracture.  Weightbearing as tolerated 18.  Bilateral rib fractures.  Conservative care. 19.  History of polysubstance abuse tobacco and alcohol abuse.  Urine drug screen negative.  Provide counseling 20.  Medical noncompliance.  Provide counseling     LOS: 9 days A FACE TO FACE EVALUATION WAS PERFORMED  Meredith Staggers 10/27/2019, 11:10 AM

## 2019-10-27 NOTE — Progress Notes (Signed)
Patient requested pain medication. Brought patient pain medication to gym with therapist and one oxy 5mg  fell on the floor Colletta Maryland OT witnessed). Wasted 5mg  oxy with charge nurse Rayetta Pigg, RN and pulled another to replace pill that fell on the floor. [place]

## 2019-10-27 NOTE — Progress Notes (Signed)
Physical Therapy Session Note  Patient Details  Name: Barry Lewis MRN: [id number] Date of Birth: Jun 24, 1961  Today's Date: 10/27/2019 PT Individual Time: 1100-1156 PT Individual Time Calculation (min): 56 min   Short Term Goals: Week 1:  PT Short Term Goal 1 (Week 1): Pt will demonstrate wc to/from bed/mat w/supervision and safe use of LRAD PT Short Term Goal 1 - Progress (Week 1): Progressing toward goal PT Short Term Goal 2 (Week 1): Gait 223f w/LRAD and cga PT Short Term Goal 2 - Progress (Week 1): Met PT Short Term Goal 3 (Week 1): ascend/descent 3 stairs w/single rail and cga assist PT Short Term Goal 3 - Progress (Week 1): Progressing toward goal Week 2:  PT Short Term Goal 1 (Week 2): =LTGs due to ELOS  Skilled Therapeutic Interventions/Progress Updates:   Received pt sitting in recliner, pt agreeable to therapy, and reported mild rib pain but did not state number, and reported he already received pain medication. Session focused on functional mobility/transfers, ambulation, stair navigation, LE strength, balance/coordination, and improved activity tolerance. Pt ambulated [id number] with rollator CGA to/from therapy gym but required 1 seated rest break halfway in between ([id number] Pt navigated 4 steps with 2 rails x 2 trials ascending and descending with a step to pattern. Pt required verbal cues for step sequence and technique. Pt required multiple rest and water breaks throughout session (NT made aware of intake amount and charted). On [employer], pt performed postural control training for 1.5 mins without UE support CGA on level static. Pt performed limits of stability training for 3.5 mins with bilateral UE support CGA on level static. Pt reported increased dizziness after activity. Dizziness improved with 3 minute seated rest break. Pt ambulated [id number] rollator CGA to mat and performed 2x10 sit<>stands without UE support CGA. Pt attempted standing marching without UE support, however with  decreased balance and requesting to hold onto rollator; able to perform x10 with CGA. Concluded session with pt supine in bed, needs within reach, and bed alarm on.   Therapy Documentation Precautions:  Precautions Precautions: Fall, Back Precaution Booklet Issued: No Precaution Comments: Reviewed log roll for comfort due to fracture Required Braces or Orthoses: (md note recommends Prevalon boot) Restrictions Weight Bearing Restrictions: No LUE Weight Bearing: Weight bearing as tolerated RLE Weight Bearing: Weight bearing as tolerated Other Position/Activity Restrictions: Prevalon Boot for wbing   Therapy/Group: Individual Therapy AnnaAlfonse Alpers DPT   10/27/2019, 7:30 AM

## 2019-10-27 NOTE — Progress Notes (Signed)
Occupational Therapy Session Note  Patient Details  Name: Barry Lewis MRN: [id number] Date of Birth: [birth year]  Today's Date: 10/27/2019 OT Individual Time: 0900-1012 OT Individual Time Calculation (min): 72 min    Short Term Goals: Week 1:  OT Short Term Goal 1 (Week 1): Pt will complete toilet transfer/toietling wiht S OT Short Term Goal 1 - Progress (Week 1): Progressing toward goal OT Short Term Goal 2 (Week 1): Pt will don B socks wiht AE PRN OT Short Term Goal 2 - Progress (Week 1): Met OT Short Term Goal 3 (Week 1): pt will thread BLE into pants with S OT Short Term Goal 4 (Week 1): Pt will groom in standing with S OT Short Term Goal 4 - Progress (Week 1): Met  Skilled Therapeutic Interventions/Progress Updates:    1;1. Pt received in bed with coaxing willing to complete therapy. Pt reporting 6/10 pain in ribs and RN brings meds. tp completes ambulatory transfer with rolator with mod VC for brake management throughout trasfers to toilet, chair in hallway sink and turns to sit on rolator for rest breaks as well. Pt completes toileting with S, handwashing with S and stands for 6 min to complete 3 pipe tree figures for standing tolerance. Educated pt on adapatations for IADLs as pt will not adequately have support to provide A for IADls. Pt provided with signs to place in house as reminder to sit while toileting/sweeping and cooking. Pt also given sign to slide hot pot of pasta water across countertop instead of carry as he had been doing before. Recommend pt get microwave and timer for safety d/t memory deficits. Exited sesion with pt saeted in bed, exit alarm and call light inr each  Therapy Documentation Precautions:  Precautions Precautions: Fall, Back Precaution Booklet Issued: No Precaution Comments: Reviewed log roll for comfort due to fracture Required Braces or Orthoses: (md note recommends Prevalon boot) Restrictions Weight Bearing Restrictions: No LUE Weight Bearing:  Weight bearing as tolerated RLE Weight Bearing: Weight bearing as tolerated Other Position/Activity Restrictions: Actor for wbing General:   Vital Signs: Therapy Vitals Temp: 98 F (36.7 C) Temp Source: Oral Pulse Rate: 87 BP: (!) 180/94 Oxygen Therapy SpO2: 99 % Pain: Pain Assessment Pain Scale: 0-10 Pain Score: 0-No pain ADL:   Vision   Perception    Praxis   Exercises:   Other Treatments:     Therapy/Group: Individual Therapy  Tonny Branch 10/27/2019, 9:01 AM

## 2019-10-27 NOTE — Progress Notes (Signed)
[facility] Progress Note    Assessment/ Plan:   1. AKI on CKD IV: Baseline 2.2-2.7.  Consistent with nephrotic syndrome, suspect diabetic nephropathy. Serological work-up unremarkable. Repeat HCV RNA pending. Lasix decreased to 40mg  QD, first dose this morning. 2.07L UOP overnight. Cr 5.57>5.82, BUN 98.  - Albumin 25g x 1  - continue Lasix 40mg  QD - RFP in AM   2. Hypertension: Remains hypertensive with SBP in the 180's. Currently on Coreg 3.125mg  BID, Hydralazine 100mg  TID, and Norvasc.   3. Hyperkalemia: K 5.2 today.  - Lokelma 10g x 1  4. Secondary hyperparathyroidism: PTH 121, Ca ~10, Phos 6.5. Cont Renvela TID w/ meals.   5. Anemia of CKD: Hgb 10.2 on 2/4. Iron sat 26. Will Monitor.   6. Hyponatremia: Resolved. Na 135 today. Continue with Lasix and fluid restriction.     7. Nutrition: Albumin 2.1.  Ensure enlive BID.   8. 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  9. IDDM: On insulin, per primary.   Disposition: Will schedule for outpatient Nephrology prior to discharge and follow pending serologies.   Subjective:   Doing well this AM. Denies any concerns or complaints. No acute events overnight.   Objective:   BP (!) 180/94   Pulse 87   Temp 98 F (36.7 C) (Oral)   Resp 16   Ht 5\' 6"  (1.676 m)   Wt 83.2 kg   SpO2 99%   BMI 29.61 kg/m   Intake/Output Summary (Last 24 hours) at 10/27/2019 0753 Last data filed at 10/27/2019 [id number] Gross per 24 hour  Intake 1619 ml  Output 2070 ml  Net -451 ml   Weight change:   Physical Exam: General: pleasant older male, resting comfortably in exam bed, in no acute distress with non-toxic appearance CV: regular rate and rhythm without murmurs, rubs, or gallops, 1+ LE edema to shin bilaterally, 2+ radial pulses bilaterally Lungs: clear to auscultation bilaterally with normal work of breathing Abdomen: soft, non-tender, non-distended Skin: warm, dry Extremities: warm and well  perfused  Imaging: No results found.  Labs: BMET Recent Labs  Lab 10/20/19 0955 10/20/19 0955 10/20/19 1934 10/21/19 1211 10/22/19 0101 10/23/19 0450 10/24/19 0853 10/25/19 1040 10/26/19 0613  NA 129*   < > 130* 130* 128* 135 134* 134* 136  K 5.1   < > 5.2* 5.1 5.4* 4.7 5.4* 5.2* 4.9  CL 94*   < > 96* 95* 96* 95* 98 97* 97*  CO2 21*   < > 22 20* 20* 25 23 22 22   GLUCOSE 240*   < > 128* 233* 166* 135* 173* 252* 124*  BUN 82*   < > 90* 91* 95* 99* 98* 96* 95*  CREATININE 5.23*   < > 5.53* 5.26* 5.28* 5.30* 5.15* 5.23* 5.57*  CALCIUM 8.0*   < > 8.0* 8.3*  7.8* 7.5* 8.6* 8.2* 8.5* 8.8*  PHOS 6.6*  --   --  6.7* 6.6* 6.4* 5.7* 6.3* 6.8*   < > = values in this interval not displayed.   CBC No results for input(s): WBC, NEUTROABS, HGB, HCT, MCV, PLT in the last 168 hours.  Medications:    . amLODipine  10 mg Oral Daily  . carvedilol  3.125 mg Oral BID WC  . collagenase   Topical BID  . docusate sodium  100 mg Oral BID  . feeding supplement (ENSURE ENLIVE)  237 mL Oral BID BM  . ferrous sulfate  325 mg Oral Q breakfast  .  fluticasone  1 spray Each Nare Daily  . folic acid  1 mg Oral Daily  . furosemide  40 mg Oral Daily  . gabapentin  300 mg Oral QHS  . heparin  5,000 Units Subcutaneous Q8H  . hydrALAZINE  100 mg Oral Q8H  . hydrocerin   Topical BID  . insulin aspart  0-15 Units Subcutaneous TID WC  . insulin glargine  18 Units Subcutaneous Daily  . lidocaine  2 patch Transdermal Q24H  . OLANZapine  5 mg Oral QHS  . polyethylene glycol  17 g Oral Daily  . sevelamer carbonate  1,600 mg Oral TID WC  . sodium bicarbonate  1,300 mg Oral BID  . thiamine  100 mg Oral Daily  . traZODone  200 mg Oral QHS  . venlafaxine XR  225 mg Oral Q breakfast    Mina Marble, DO  Family Medicine Resident PGY-2  10/27/2019, 7:53 AM

## 2019-10-27 NOTE — Progress Notes (Signed)
SLP Cancellation Note  Patient Details Name: Barry Lewis MRN: [id number] DOB: Mar 15, 1961   Cancelled treatment:       Patient missed 60 minutes of skilled SLP intervention due to fatigue. Patient reported, "I don't know what's wrong with me," RN aware.  Patient was lethargic and unable to maintain arousal. Continue with current plan of care.                                                                                                 Raney Antwine 10/27/2019, 3:06 PM

## 2019-10-28 ENCOUNTER — Inpatient Hospital Stay (HOSPITAL_COMMUNITY): Payer: Medicare Other

## 2019-10-28 ENCOUNTER — Inpatient Hospital Stay (HOSPITAL_COMMUNITY): Payer: Medicare Other | Admitting: Occupational Therapy

## 2019-10-28 ENCOUNTER — Inpatient Hospital Stay (HOSPITAL_COMMUNITY): Payer: Medicare Other | Admitting: Speech Pathology

## 2019-10-28 LAB — URINALYSIS, ROUTINE W REFLEX MICROSCOPIC
Bacteria, UA: NONE SEEN
Bilirubin Urine: NEGATIVE
Glucose, UA: >=500 mg/dL — AB
Hgb urine dipstick: NEGATIVE
Ketones, ur: NEGATIVE mg/dL
Leukocytes,Ua: NEGATIVE
Nitrite: NEGATIVE
Protein, ur: >=300 mg/dL — AB
Specific Gravity, Urine: 1.01 (ref 1.005–1.030)
pH: 8 (ref 5.0–8.0)

## 2019-10-28 LAB — RENAL FUNCTION PANEL
Albumin: 2.3 g/dL — ABNORMAL LOW (ref 3.5–5.0)
Anion gap: 12 (ref 5–15)
BUN: 104 mg/dL — ABNORMAL HIGH (ref 6–20)
CO2: 22 mmol/L (ref 22–32)
Calcium: 8.7 mg/dL — ABNORMAL LOW (ref 8.9–10.3)
Chloride: 102 mmol/L (ref 98–111)
Creatinine, Ser: 6.33 mg/dL — ABNORMAL HIGH (ref 0.61–1.24)
GFR calc Af Amer: 10 mL/min — ABNORMAL LOW (ref >60)
GFR calc non Af Amer: 9 mL/min — ABNORMAL LOW (ref >60)
Glucose, Bld: 148 mg/dL — ABNORMAL HIGH (ref 70–99)
Phosphorus: 5.9 mg/dL — ABNORMAL HIGH (ref 2.5–4.6)
Potassium: 5.3 mmol/L — ABNORMAL HIGH (ref 3.5–5.1)
Sodium: 136 mmol/L (ref 135–145)

## 2019-10-28 LAB — CREATININE, URINE, RANDOM: Creatinine, Urine: 34.79 mg/dL

## 2019-10-28 LAB — GLUCOSE, CAPILLARY
Glucose-Capillary: 130 mg/dL — ABNORMAL HIGH (ref 70–99)
Glucose-Capillary: 223 mg/dL — ABNORMAL HIGH (ref 70–99)
Glucose-Capillary: 174 mg/dL — ABNORMAL HIGH (ref 70–99)
Glucose-Capillary: 176 mg/dL — ABNORMAL HIGH (ref 70–99)

## 2019-10-28 LAB — HCV RNA QUANT
HCV Quantitative Log: 5.535 log10 IU/mL (ref >1.70)
HCV Quantitative: 343000 IU/mL (ref >50)

## 2019-10-28 MED ORDER — SODIUM CHLORIDE 0.9 % IV SOLN: INTRAVENOUS | Status: AC

## 2019-10-28 MED ORDER — PATIROMER SORBITEX CALCIUM 8.4 G PO PACK
16.8000 g | PACK | Freq: Every day | ORAL | Status: AC
  Administered 2019-10-28: 12:00:00 16.8 g via ORAL
  Filled 2019-10-28: qty 2

## 2019-10-28 NOTE — Progress Notes (Signed)
Speech Language Pathology Daily Session Note  Patient Details  Name: Barry Lewis MRN: [id number] Date of Birth: 14-Sep-1961  Today's Date: 10/28/2019 SLP Individual Time: 1049-1130 SLP Individual Time Calculation (min): 41 min  Short Term Goals: Week 2: SLP Short Term Goal 1 (Week 2): STGs=LTGs due to ELOS  Skilled Therapeutic Interventions:  Pt was seen for skilled ST targeting cognitive goals.  SLP facilitated the session with a novel card game targeting goals for attention and problem solving.  Pt was able to plan and execute a problem solving strategy within task with supervision verbal cues for working memory of task rules and procedures.  Pt was able to alternate his attention between card game and conversations with therapist for task's duration with supervision verbal cues for redirection to task.  Pt was left in recliner with chair alarm set.  Continue per current plan of care.    Pain Pain Assessment Pain Scale: 0-10 Pain Score: 0-No pain  Therapy/Group: Individual Therapy  Emeree Mahler, Selinda Orion 10/28/2019, 12:01 PM

## 2019-10-28 NOTE — Progress Notes (Signed)
[facility] PROGRESS NOTE   Subjective/Complaints:   Feels ok except ribs sore with turning in bed   ROS: Patient denies SOB, pain with inspiration, N/V/D     Objective:   No results found. No results for input(s): WBC, HGB, HCT, PLT in the last 72 hours. Recent Labs    10/26/19 0613 10/27/19 0829  NA 136 135  K 4.9 5.2*  CL 97* 99  CO2 22 22  GLUCOSE 124* 173*  BUN 95* 98*  CREATININE 5.57* 5.82*  CALCIUM 8.8* 8.6*    Intake/Output Summary (Last 24 hours) at 10/28/2019 0839 Last data filed at 10/28/2019 0755 Gross per 24 hour  Intake 2120 ml  Output 3045 ml  Net -925 ml     Physical Exam: Vital Signs Blood pressure (!) 170/90, pulse 92, temperature 98.6 F (37 C), resp. rate 16, height 5\' 6"  (1.676 m), weight 83.2 kg, SpO2 91 %. Constitutional: No distress . Vital signs reviewed. HEENT: EOMI, oral membranes moist Neck: supple Cardiovascular: RRR without murmur. No JVD    Respiratory: CTA Bilaterally without wheezes or rales. Normal effort    GI: BS +, non-tender, non-distended  Musculoskeletal:     Comments: tender over right chest wall/axillary line, sternum less tender    Neurological: He is alert.  Oriented to place person. Fair insight. STM deficits present.  Motor: Bilateral upper extremities: 4+/5 proximal distal right lower extremity: Hip flexion, knee extension 4/5, ankle dorsiflexion 4+/5 Left lower extremity: Hip flexion, knee extension 4 -/5, ankle dorsiflexion 4+/5. Decreased LT in both feet.  Skin:  Eschar a little looser on right first MT  Psychiatric:pleasant and in good spirits.     Assessment/Plan: 1. Functional deficits secondary to encephalopathy, polytrauma which require 3+ hours per day of interdisciplinary therapy in a comprehensive inpatient rehab setting.  Physiatrist is providing close team supervision and 24 hour management of active medical problems listed below.  Physiatrist and rehab team  continue to assess barriers to discharge/monitor patient progress toward functional and medical goals  Care Tool:  Bathing    Body parts bathed by patient: Right arm, Left arm, Chest, Abdomen, Front perineal area, Right upper leg, Left upper leg, Right lower leg, Left lower leg, Face, Buttocks   Body parts bathed by helper: Buttocks     Bathing assist Assist Level: Supervision/Verbal cueing     Upper Body Dressing/Undressing Upper body dressing   What is the patient wearing?: Pull over shirt    Upper body assist Assist Level: Supervision/Verbal cueing    Lower Body Dressing/Undressing Lower body dressing      What is the patient wearing?: Pants     Lower body assist Assist for lower body dressing: Contact Guard/Touching assist     Toileting Toileting    Toileting assist Assist for toileting: Supervision/Verbal cueing Assistive Device Comment: urinal   Transfers Chair/bed transfer  Transfers assist     Chair/bed transfer assist level: Contact Guard/Touching assist     Locomotion Ambulation   Ambulation assist   Ambulation activity did not occur: Safety/medical concerns(used cane PTA, unsafe to attempt)  Assist level: Contact Guard/Touching assist Assistive device: Rollator Max distance: 150'   Walk 10 feet activity   Assist  Walk 10 feet activity did not occur: Safety/medical concerns(unsafe to attempt w/cane)  Assist level: Contact Guard/Touching assist Assistive device: Rollator   Walk 50 feet activity   Assist Walk 50 feet with 2 turns activity did not occur: Safety/medical concerns  Assist level:  Contact Guard/Touching assist Assistive device: Rollator    Walk 150 feet activity   Assist Walk 150 feet activity did not occur: Safety/medical concerns  Assist level: Contact Guard/Touching assist Assistive device: Rollator    Walk 10 feet on uneven surface  activity   Assist Walk 10 feet on uneven surfaces activity did not occur:  Safety/medical concerns         Wheelchair     Assist Will patient use wheelchair at discharge?: No             Wheelchair 50 feet with 2 turns activity    Assist            Wheelchair 150 feet activity     Assist          Blood pressure (!) 170/90, pulse 92, temperature 98.6 F (37 C), resp. rate 16, height 5\' 6"  (1.676 m), weight 83.2 kg, SpO2 91 %.  Medical Problem List and Plan: 1.  Debility/encephalopathy secondary to polytrauma.             -patient may not shower             -ELOS 2/18 CIR therapies including PT and OT and SLP  -  darco shoe for right foot to off load 1st MT head while standing   -mod I goals given lack of supervision at home.     2.  Antithrombotics: -DVT/anticoagulation:  SQ Heparin             -antiplatelet therapy: N/A 3. Pain Management/Chronic back pain: Neurontin 300 mg nightly, trazodone 200 mg nightly oxycodone as needed   2/10: Added kpad for lower back and lidocaine patch for right rib pain  2/11 will increase lidocaine patches to 2 daily. Not anxious to increase oxycodone further given cognition, total dose, etc  2/12 pain better controlled today 4. Mood:/Dementia With history of bipolar disorder.  Effexor 225 mg daily             -antipsychotic agents: Zyprexa 5 mg nightly  -anxiety likely a factor in pain. 5. Neuropsych: This patient is capable of making decisions on his own behalf.  2/10: Telesitter may be d/ced 6. Skin/Wound Care:   7. Fluids/Electrolytes/Nutrition: Routine in and outs.  CMP ordered. 8.  Acute on chronic renal failure stage IV.  Baseline creatinine 2.2-2.7.  Follow-up renal services.  Continue Lasix 40mg  daily.  No current plan for hemodialysis                2/12 Cr up to 5.8 today, slowly trending up, K is 5.2   -nephrology help appreciated 9.  Rhabdomyolysis.  CK trending down. 10.  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  Hemoglobin A1c 8.5.  Lantus insulin 15  units daily.  Check blood sugars before meals and at bedtime.  Diabetic teaching             2/5 sugars remain elevated.   2/9 increased lantus to 18u daily.   -cover sith SSI  2/12 fair control---follow for further pattern    CBG (last 3)  Recent Labs    10/27/19 1634 10/27/19 2107 10/28/19 0617  GLUCAP 218* 150* 130*      11. Hypertension.Norvasc 10 mg daily,Hydralazine 25 mg every 8 hours             2/9 some increase today. Follow for more consistent pattern   -no changes at present  2/12 intermittent elevation often d/t pain/anxiety 12.  Combined systolic  and diastolic congestive heart failure.  Monitor for any signs of fluid overload.  Continue diuresis             2/5 Daily weights needed. None today  2/8-12 weights stable   Filed Weights   10/24/19 0429 10/25/19 0416 10/26/19 0628  Weight: 83.8 kg 84.1 kg 83.2 kg    13.  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  Continue Prevalon boot.  Follow-up outpatient Dr. Sharol Given to plan for debridement.  No surgical intervention necessary at this time  -see above #6 14.  Left pleural effusion with history of COPD.  Continue diuresis.  May need thoracentesis.  Follow-up chest x-ray as needed  IS, OOB 15.  Destructive process involving sternum suspect old fracture versus infection.  Latest blood cultures no growth to date.  Radiologist reviewed likely chronic smoldering process.  Follow-up outpatient CVTS.  -rx pain as needed, appears controlled  2/8 likely cause of w/e chest pain.       Appreciate cardiology follow up 16.  T3 as well as T1 and T2 right transverse process fractures.  Conservative care per Dr. Ashok Pall 17.  Right scapular fracture.  Weightbearing as tolerated 18.  Bilateral rib fractures.  Conservative care. 19.  History of polysubstance abuse tobacco and alcohol abuse.  Urine drug screen negative.  Provide counseling 20.  Medical noncompliance.   Provide counseling  21.  Sternal tenderness, ?chronic deformity after fracture vs chronic osteo- OP f/u planned   LOS: 10 days A FACE TO FACE EVALUATION WAS PERFORMED  Charlett Blake 10/28/2019, 8:39 AM

## 2019-10-28 NOTE — Progress Notes (Signed)
[facility] Progress Note    Assessment/ Plan:   1. AKI on CKD IV, nonoliguric: Cr 5.57>5.82>6.3 today. Baseline 2.2-2.7. Euvolemic, suspect continued rise due to intravascular depletion due to lasix therapy, in the setting of known diabetic nephropathy. Serological work-up unremarkable. Repeat HCV RNA pending.  Will discontinue Lasix and provide gentle IV hydration x10 hours.  Additionally will obtain renal ultrasound and UA/urine lytes to further evaluate and rule out obstruction.     2. Hypertension: Remains hypertensive with SBP in the 160-180s. Currently on Coreg 3.125mg  BID, Hydralazine 100mg  TID, and Norvasc. Will monitor for now.    3. Hyperkalemia: K 5.3 today. Will give Veltassa x1.   4. Secondary hyperparathyroidism: PTH 121, Ca ~10, Phos 5.9. Cont Renvela TID w/ meals.   5. Anemia of CKD: Hgb 10.2 on 2/4. Iron sat 26. Will Monitor.   6. Hyponatremia: Resolved. Continue with Lasix and fluid restriction.     7. Nutrition: Albumin 2.1.  Ensure enlive BID.   8. 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  9. IDDM: On insulin, per primary.   Disposition: Outpatient nephrology follow up scheduled.    Subjective:   No acute events overnight.  Resting comfortably this morning, no complaints.   Objective:   BP (!) 170/90 (BP Location: Left Arm)   Pulse 92   Temp 98.6 F (37 C)   Resp 16   Ht 5\' 6"  (1.676 m)   Wt 83.2 kg   SpO2 91%   BMI 29.61 kg/m   Intake/Output Summary (Last 24 hours) at 10/28/2019 0745 Last data filed at 10/28/2019 [id number] Gross per 24 hour  Intake 1880 ml  Output 2700 ml  Net -820 ml   Weight change:   Physical Exam: General: Pleasant gentleman in no acute distress, resting comfortably Cardiac: Regular rate and rhythm without murmurs appreciated Lungs: Clear bilaterally with unlabored breathing Abdomen: Soft Extremities, warm, dry no edema to bilateral LE  Imaging: No results  found.  Labs: BMET Recent Labs  Lab 10/21/19 1211 10/22/19 0101 10/23/19 0450 10/24/19 0853 10/25/19 1040 10/26/19 0613 10/27/19 0829  NA 130* 128* 135 134* 134* 136 135  K 5.1 5.4* 4.7 5.4* 5.2* 4.9 5.2*  CL 95* 96* 95* 98 97* 97* 99  CO2 20* 20* 25 23 22 22 22   GLUCOSE 233* 166* 135* 173* 252* 124* 173*  BUN 91* 95* 99* 98* 96* 95* 98*  CREATININE 5.26* 5.28* 5.30* 5.15* 5.23* 5.57* 5.82*  CALCIUM 8.3*  7.8* 7.5* 8.6* 8.2* 8.5* 8.8* 8.6*  PHOS 6.7* 6.6* 6.4* 5.7* 6.3* 6.8* 6.5*   CBC No results for input(s): WBC, NEUTROABS, HGB, HCT, MCV, PLT in the last 168 hours.  Medications:    . amLODipine  10 mg Oral Daily  . calcium acetate  1,334 mg Oral TID WC  . carvedilol  3.125 mg Oral BID WC  . collagenase   Topical BID  . docusate sodium  100 mg Oral BID  . feeding supplement (NEPRO CARB STEADY)  237 mL Oral BID BM  . ferrous sulfate  325 mg Oral Q breakfast  . fluticasone  1 spray Each Nare Daily  . folic acid  1 mg Oral Daily  . furosemide  40 mg Oral Daily  . gabapentin  300 mg Oral QHS  . heparin  5,000 Units Subcutaneous Q8H  . hydrALAZINE  100 mg Oral Q8H  . insulin aspart  0-15 Units Subcutaneous TID WC  . insulin glargine  18 Units Subcutaneous  Daily  . lidocaine  2 patch Transdermal Q24H  . OLANZapine  5 mg Oral QHS  . polyethylene glycol  17 g Oral Daily  . sodium bicarbonate  1,300 mg Oral BID  . thiamine  100 mg Oral Daily  . traZODone  200 mg Oral QHS  . venlafaxine XR  225 mg Oral Q breakfast    Darrelyn Hillock, DO  Family Medicine Resident PGY-2  10/28/2019, 7:45 AM

## 2019-10-28 NOTE — Progress Notes (Signed)
Occupational Therapy Session Note  Patient Details  Name: Barry Lewis MRN: [id number] Date of Birth: 1960/10/29  Today's Date: 10/28/2019 OT Individual Time: 1300-1400 OT Individual Time Calculation (min): 60 min    Short Term Goals: Week 2:  OT Short Term Goal 1 (Week 2): STG= LTG d/t ELOS  Skilled Therapeutic Interventions/Progress Updates:    OT session focused on ADL retraining, functional mobility, IADLs, energy conservation, and safety awareness. Pt received sitting on toilet and required mod cues for safety upon transition back to room using rollator. Due to continuous IV, pt unable to shower, however agreeable to sponge bath. Pt completed sit<>stand throughout session with supervision and min cues for safety. Pt required min cues for crossover technique to facilitate adherence to back precautions during bathing. Pt ambulated to ADL apartment with supervision using rollator and with 1 rest break. Pt completed functional transfers from couch and kitchen chair with supervision and min cues or safety. OT setup 5 unsafe scenarios in kitchen with pt identifying with 100% accuracy, however required assist to problem solve safe removal of cup from floor while adhering to back precautions. Pt able to recall 2/3 precautions independently, however decreased recall during functional activities. Returned to room and provided handout/education for [facility] techniques in preparation of discharge home. Emphasized importance of prioritizing activities and taking rest breaks as pt frequently demonstrates SOB. Pt receptive to feedback and left sitting in recliner with all needs in reach and safety belt donned.   Therapy Documentation Precautions:  Precautions Precautions: Fall, Back Precaution Booklet Issued: No Precaution Comments: Reviewed log roll for comfort due to fracture Required Braces or Orthoses: (md note recommends Prevalon boot) Restrictions Weight Bearing Restrictions: No LUE Weight Bearing:  Weight bearing as tolerated RLE Weight Bearing: Weight bearing as tolerated Other Position/Activity Restrictions: Prevalon Boot for wbing General:   Vital Signs:  Pain: Pain Assessment Pain Scale: 0-10 Pain Score: 0-No pain ADL:   Vision   Perception    Praxis   Exercises:   Other Treatments:     Therapy/Group: Individual Therapy  Duayne Cal 10/28/2019, 3:30 PM

## 2019-10-29 ENCOUNTER — Inpatient Hospital Stay (HOSPITAL_COMMUNITY): Payer: Medicare Other

## 2019-10-29 LAB — GLUCOSE, CAPILLARY
Glucose-Capillary: 92 mg/dL (ref 70–99)
Glucose-Capillary: 166 mg/dL — ABNORMAL HIGH (ref 70–99)
Glucose-Capillary: 192 mg/dL — ABNORMAL HIGH (ref 70–99)
Glucose-Capillary: 149 mg/dL — ABNORMAL HIGH (ref 70–99)

## 2019-10-29 LAB — RENAL FUNCTION PANEL
Albumin: 2.2 g/dL — ABNORMAL LOW (ref 3.5–5.0)
Anion gap: 12 (ref 5–15)
BUN: 95 mg/dL — ABNORMAL HIGH (ref 6–20)
CO2: 20 mmol/L — ABNORMAL LOW (ref 22–32)
Calcium: 8.8 mg/dL — ABNORMAL LOW (ref 8.9–10.3)
Chloride: 105 mmol/L (ref 98–111)
Creatinine, Ser: 5.91 mg/dL — ABNORMAL HIGH (ref 0.61–1.24)
GFR calc Af Amer: 11 mL/min — ABNORMAL LOW (ref >60)
GFR calc non Af Amer: 10 mL/min — ABNORMAL LOW (ref >60)
Glucose, Bld: 101 mg/dL — ABNORMAL HIGH (ref 70–99)
Phosphorus: 5.7 mg/dL — ABNORMAL HIGH (ref 2.5–4.6)
Potassium: 4.8 mmol/L (ref 3.5–5.1)
Sodium: 137 mmol/L (ref 135–145)

## 2019-10-29 MED ORDER — CARVEDILOL 6.25 MG PO TABS
6.2500 mg | ORAL_TABLET | Freq: Two times a day (BID) | ORAL | Status: DC
  Administered 2019-10-29 – 2019-10-31 (×4): 6.25 mg via ORAL
  Filled 2019-10-29 (×4): qty 1

## 2019-10-29 MED ORDER — OXYCODONE HCL 5 MG PO TABS
5.0000 mg | ORAL_TABLET | ORAL | Status: DC | PRN
  Administered 2019-10-29 – 2019-10-31 (×7): 5 mg via ORAL
  Filled 2019-10-29 (×7): qty 1

## 2019-10-29 NOTE — Progress Notes (Signed)
[facility] Progress Note    Assessment/ Plan:   1. AKI on CKD IV, nonoliguric: Kidney function continued to decline thus Lasix discontinued, gentle fluids overnight. Suspect intravascular depletion due to lasix in setting of known diabetic nephropathy. Creatinine improved today: 4.47>5.82>6.3>5.91. Baseline Cr 2.2-2.7. Euvolemic on exam this AM. UOP 374mL with several unmearsured. Electrolytes stable. UA notable proteinuria. Urine BUN pending. Renal ultrasound negative for obstruction.   - cont to hold diuretics - monitor overnight - follow up on Urine urea, calculate FEUrea   2. Hypertension: BP remains elevated in 160's/80's overnight. Current meds: Coreg 3.125mg  BID, Hydralazine 100mg  TID, and Norvasc.  - increase Coreg to 6.25mg  BID  3. Hyperkalemia: resolved. K 4.8 today.  - continue to monitor  4. Secondary hyperparathyroidism: PTH 121, Ca ~10, Phos 5.7.  - cont Calcium acetate TID with meals  - monitor Ca levels  5. Anemia of CKD: Hgb 10.2 on 2/4. Iron sat 26. Will Monitor.    6. Nutrition: Albumin 2.1.  Ensure enlive BID.   7. 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  8. IDDM: On insulin, per primary.   Disposition: Outpatient nephrology follow up scheduled.    Subjective:   Patient lying in bed comfortably this AM. Denies any concerns or complaints. No acute events overnight.   Objective:   BP (!) 164/85   Pulse 87   Temp 98 F (36.7 C)   Resp 19   Ht 5\' 6"  (1.676 m)   Wt 83.2 kg   SpO2 94%   BMI 29.61 kg/m   Intake/Output Summary (Last 24 hours) at 10/29/2019 0935 Last data filed at 10/29/2019 [id number] Gross per 24 hour  Intake 1240 ml  Output 2200 ml  Net -960 ml   Weight change:   Physical Exam: General: pleasant older male, lying comfortably in exam bed, in no acute distress with non-toxic appearance CV: regular rate and rhythm without murmurs, rubs, or gallops, trace LE edema bilaterally Lungs: clear to  auscultation bilaterally with normal work of breathing on RA Abdomen: soft, non-tender, non-distended Skin: warm, dry Extremities: warm and well perfused Neuro:  speech normal  Imaging: US RENAL  Result Date: 10/28/2019 CLINICAL DATA:  Acute kidney injury EXAM: RENAL / URINARY TRACT ULTRASOUND COMPLETE COMPARISON:  Ultrasound 10/14/2019 FINDINGS: Right Kidney: Renal measurements: 11.6 x 5.1 x 6 cm = volume: 176.3 mL . Echogenicity within normal limits. No mass or hydronephrosis visualized. Left Kidney: Renal measurements: 10.8 x 6.2 x 5.7 cm = volume: 199 mL. Echogenicity within normal limits. No mass or hydronephrosis visualized. Bladder: Appears normal for degree of bladder distention. Other: Prevoid bladder volume 290 cc. IMPRESSION: Negative renal ultrasound Electronically Signed   By: Donavan Foil M.D.   On: 10/28/2019 15:58    Labs: BMET Recent Labs  Lab 10/23/19 0450 10/24/19 0853 10/25/19 1040 10/26/19 0613 10/27/19 0829 10/28/19 1020 10/29/19 0521  NA 135 134* 134* 136 135 136 137  K 4.7 5.4* 5.2* 4.9 5.2* 5.3* 4.8  CL 95* 98 97* 97* 99 102 105  CO2 25 23 22 22 22 22  20*  GLUCOSE 135* 173* 252* 124* 173* 148* 101*  BUN 99* 98* 96* 95* 98* 104* 95*  CREATININE 5.30* 5.15* 5.23* 5.57* 5.82* 6.33* 5.91*  CALCIUM 8.6* 8.2* 8.5* 8.8* 8.6* 8.7* 8.8*  PHOS 6.4* 5.7* 6.3* 6.8* 6.5* 5.9* 5.7*   CBC No results for input(s): WBC, NEUTROABS, HGB, HCT, MCV, PLT in the last 168 hours.  Medications:    .  amLODipine  10 mg Oral Daily  . calcium acetate  1,334 mg Oral TID WC  . carvedilol  3.125 mg Oral BID WC  . collagenase   Topical BID  . docusate sodium  100 mg Oral BID  . feeding supplement (NEPRO CARB STEADY)  237 mL Oral BID BM  . ferrous sulfate  325 mg Oral Q breakfast  . fluticasone  1 spray Each Nare Daily  . folic acid  1 mg Oral Daily  . gabapentin  300 mg Oral QHS  . heparin  5,000 Units Subcutaneous Q8H  . hydrALAZINE  100 mg Oral Q8H  . insulin aspart  0-15  Units Subcutaneous TID WC  . insulin glargine  18 Units Subcutaneous Daily  . lidocaine  2 patch Transdermal Q24H  . OLANZapine  5 mg Oral QHS  . polyethylene glycol  17 g Oral Daily  . sodium bicarbonate  1,300 mg Oral BID  . thiamine  100 mg Oral Daily  . traZODone  200 mg Oral QHS  . venlafaxine XR  225 mg Oral Q breakfast    Mina Marble, DO Family Medicine Resident PGY-2  10/29/2019, 9:35 AM

## 2019-10-29 NOTE — Progress Notes (Signed)
[facility] PROGRESS NOTE   Subjective/Complaints:  Appreciate nephro note , pain is well controlled , checked MAR, takes Oxy IR 10mg  once daily on average OK with trying 5mg   ROS: Patient denies SOB, pain with inspiration, N/V/D     Objective:   US RENAL  Result Date: 10/28/2019 CLINICAL DATA:  Acute kidney injury EXAM: RENAL / URINARY TRACT ULTRASOUND COMPLETE COMPARISON:  Ultrasound 10/14/2019 FINDINGS: Right Kidney: Renal measurements: 11.6 x 5.1 x 6 cm = volume: 176.3 mL . Echogenicity within normal limits. No mass or hydronephrosis visualized. Left Kidney: Renal measurements: 10.8 x 6.2 x 5.7 cm = volume: 199 mL. Echogenicity within normal limits. No mass or hydronephrosis visualized. Bladder: Appears normal for degree of bladder distention. Other: Prevoid bladder volume 290 cc. IMPRESSION: Negative renal ultrasound Electronically Signed   By: Donavan Foil M.D.   On: 10/28/2019 15:58   No results for input(s): WBC, HGB, HCT, PLT in the last 72 hours. Recent Labs    10/28/19 1020 10/29/19 0521  NA 136 137  K 5.3* 4.8  CL 102 105  CO2 22 20*  GLUCOSE 148* 101*  BUN 104* 95*  CREATININE 6.33* 5.91*  CALCIUM 8.7* 8.8*    Intake/Output Summary (Last 24 hours) at 10/29/2019 [id number] Last data filed at 10/29/2019 0500 Gross per 24 hour  Intake 1120 ml  Output 1900 ml  Net -780 ml     Physical Exam: Vital Signs Blood pressure (!) 161/82, pulse 81, temperature 98 F (36.7 C), resp. rate 19, height 5\' 6"  (1.676 m), weight 83.2 kg, SpO2 94 %. Constitutional: No distress . Vital signs reviewed. HEENT: EOMI, oral membranes moist Neck: supple Cardiovascular: RRR without murmur. No JVD    Respiratory: CTA Bilaterally without wheezes or rales. Normal effort    GI: BS +, non-tender, non-distended  Musculoskeletal:     Comments: tender over right chest wall/axillary line, sternum less tender    Neurological: He is alert.  Oriented to place  person. Fair insight. STM deficits present.  Motor: Bilateral upper extremities: 4+/5 proximal distal right lower extremity: Hip flexion, knee extension 4/5, ankle dorsiflexion 4+/5 Left lower extremity: Hip flexion, knee extension 4 -/5, ankle dorsiflexion 4+/5. Decreased LT in both feet.  Skin:  Eschar a little looser on right first MT  Psychiatric:pleasant and in good spirits.     Assessment/Plan: 1. Functional deficits secondary to encephalopathy, polytrauma which require 3+ hours per day of interdisciplinary therapy in a comprehensive inpatient rehab setting.  Physiatrist is providing close team supervision and 24 hour management of active medical problems listed below.  Physiatrist and rehab team continue to assess barriers to discharge/monitor patient progress toward functional and medical goals  Care Tool:  Bathing    Body parts bathed by patient: Right arm, Left arm, Chest, Abdomen, Front perineal area, Right upper leg, Left upper leg, Right lower leg, Left lower leg, Face, Buttocks   Body parts bathed by helper: Buttocks     Bathing assist Assist Level: Supervision/Verbal cueing     Upper Body Dressing/Undressing Upper body dressing   What is the patient wearing?: Pull over shirt    Upper body assist Assist Level: Supervision/Verbal cueing    Lower Body Dressing/Undressing Lower body dressing      What is the patient wearing?: Pants     Lower body assist Assist for lower body dressing: Contact Guard/Touching assist     Toileting Toileting    Toileting assist Assist for toileting: Supervision/Verbal cueing  Assistive Device Comment: urinal   Transfers Chair/bed transfer  Transfers assist     Chair/bed transfer assist level: Contact Guard/Touching assist     Locomotion Ambulation   Ambulation assist   Ambulation activity did not occur: Safety/medical concerns(used cane PTA, unsafe to attempt)  Assist level: Contact Guard/Touching  assist Assistive device: Rollator Max distance: 150'   Walk 10 feet activity   Assist  Walk 10 feet activity did not occur: Safety/medical concerns(unsafe to attempt w/cane)  Assist level: Contact Guard/Touching assist Assistive device: Rollator   Walk 50 feet activity   Assist Walk 50 feet with 2 turns activity did not occur: Safety/medical concerns  Assist level: Contact Guard/Touching assist Assistive device: Rollator    Walk 150 feet activity   Assist Walk 150 feet activity did not occur: Safety/medical concerns  Assist level: Contact Guard/Touching assist Assistive device: Rollator    Walk 10 feet on uneven surface  activity   Assist Walk 10 feet on uneven surfaces activity did not occur: Safety/medical concerns         Wheelchair     Assist Will patient use wheelchair at discharge?: No             Wheelchair 50 feet with 2 turns activity    Assist            Wheelchair 150 feet activity     Assist          Blood pressure (!) 161/82, pulse 81, temperature 98 F (36.7 C), resp. rate 19, height 5\' 6"  (1.676 m), weight 83.2 kg, SpO2 94 %.  Medical Problem List and Plan: 1.  Debility/encephalopathy secondary to polytrauma.             -patient may not shower ,              -ELOS 2/18 CIR therapies including PT and OT and SLP  -  darco shoe for right foot to off load 1st MT head while standing   -mod I goals given lack of supervision at home.     2.  Antithrombotics: -DVT/anticoagulation:  SQ Heparin             -antiplatelet therapy: N/A 3. Pain Management/Chronic back pain: Neurontin 300 mg nightly, trazodone 200 mg nightly oxycodone as needed   2/10: Added kpad for lower back and lidocaine patch for right rib pain  2/11 will increase lidocaine patches to 2 daily. Not anxious to increase oxycodone further given cognition, total dose, etc  2/14 pain control good , takes Oxy IR 10mg  daily, will try going down to 5mg   4.  Mood:/Dementia With history of bipolar disorder.  Effexor 225 mg daily             -antipsychotic agents: Zyprexa 5 mg nightly  -anxiety likely a factor in pain. 5. Neuropsych: This patient is capable of making decisions on his own behalf.  2/10: Telesitter may be d/ced 6. Skin/Wound Care:   7. Fluids/Electrolytes/Nutrition: Routine in and outs.  CMP ordered. 8.  Acute on chronic renal failure stage IV.  Baseline creatinine 2.2-2.7.  Follow-up renal services.  Continue Lasix 40mg  daily.  No current plan for hemodialysis                2/12 Cr up to 5.8 today, slowly trending up, K is 5.2   -nephrology help appreciated 9.  Rhabdomyolysis.  CK trending down. 10.  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  Hemoglobin A1c 8.5.  Lantus  insulin 15 units daily.  Check blood sugars before meals and at bedtime.  Diabetic teaching             2/5 sugars remain elevated.   2/9 increased lantus to 18u daily.   -cover sith SSI  2/12 fair control---follow for further pattern    CBG (last 3)  Recent Labs    10/28/19 1657 10/28/19 2102 10/29/19 0628  GLUCAP 174* 176* 92    Controlled 2/14  11. Hypertension.Norvasc 10 mg daily,Hydralazine 25 mg every 8 hours             2/9 some increase today. Follow for more consistent pattern   -no changes at present  2/12 intermittent elevation often d/t pain/anxiety 12.  Combined systolic and diastolic congestive heart failure.  Monitor for any signs of fluid overload.  Continue diuresis               Filed Weights   10/24/19 0429 10/25/19 0416 10/26/19 0628  Weight: 83.8 kg 84.1 kg 83.2 kg   None recorded x 3 d order written 2/4 13.  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  Continue Prevalon boot.  Follow-up outpatient Dr. Sharol Given to plan for debridement.  No surgical intervention necessary at this time  -see above #6 14.  Left pleural effusion with history of COPD.   Continue diuresis.  May need thoracentesis.  Follow-up chest x-ray as needed  IS, OOB 15.  Destructive process involving sternum suspect old fracture versus infection.  Latest blood cultures no growth to date.  Radiologist reviewed likely chronic smoldering process.  Follow-up outpatient CVTS.  -rx pain as needed, appears controlled  2/8 likely cause of w/e chest pain.       Appreciate cardiology follow up 16.  T3 as well as T1 and T2 right transverse process fractures.  Conservative care per Dr. Ashok Pall 17.  Right scapular fracture.  Weightbearing as tolerated 18.  Bilateral rib fractures.  Conservative care. 19.  History of polysubstance abuse tobacco and alcohol abuse.  Urine drug screen negative.  Provide counseling 20.  Medical noncompliance.  Provide counseling     LOS: 11 days A FACE TO FACE EVALUATION WAS PERFORMED  Charlett Blake 10/29/2019, 8:08 AM

## 2019-10-29 NOTE — Plan of Care (Signed)
  Problem: Consults Goal: RH GENERAL PATIENT EDUCATION Description: See Patient Education module for education specifics. Outcome: Progressing Goal: Skin Care Protocol Initiated - if Braden Score 18 or less Description: If consults are not indicated, leave blank or document N/A Outcome: Progressing Goal: Diabetes Guidelines if Diabetic/Glucose > 140 Description: If diabetic or lab glucose is > 140 mg/dl - Initiate Diabetes/Hyperglycemia Guidelines & Document Interventions  Outcome: Progressing   Problem: RH SKIN INTEGRITY Goal: RH STG SKIN FREE OF INFECTION/BREAKDOWN Description: Patients skin will remain free from further infection or breakdown with mod I assist. Outcome: Progressing Goal: RH STG MAINTAIN SKIN INTEGRITY WITH ASSISTANCE Description: STG Maintain Skin Integrity With mod I Assistance. Outcome: Progressing   Problem: RH SAFETY Goal: RH STG ADHERE TO SAFETY PRECAUTIONS W/ASSISTANCE/DEVICE Description: STG Adhere to Safety Precautions With supervision/cues Assistance/Device. Outcome: Progressing

## 2019-10-29 NOTE — Progress Notes (Signed)
Occupational Therapy Session Note  Patient Details  Name: Barry Lewis MRN: [id number] Date of Birth: [birth year]  Today's Date: 10/29/2019 OT Individual Time: 1300-1413 OT Individual Time Calculation (min): 73 min    Short Term Goals: Week 2:  OT Short Term Goal 1 (Week 2): STG= LTG d/t ELOS  Skilled Therapeutic Interventions/Progress Updates:    Pt received supine in bed with no c/o pain. Pt reporting feeling down re extended hospital stay and is ready to go home. Discussed plan for session and ideas to boost mood- pt agreeable to all. Pt transitioned to EOB with mod I and donned darco boot with mod I. Pt used rollator with proper locking/unlocking and completed functional mobility into the bathroom with (S). Pt required cueing with impulsive transfer to toilet 2/2 bowel urgency. Pt voided BM, completed hygiene seated with (S) and then transferred into shower. IV access and R foot occluded for shower. Pt completed all bathing at (S) level sit <> stand. Pt's BLE ulcers were re-wrapped in gauze and pt donned socks and darco boot with figure 4 technique with set up assist. Pt used rollator to return to sitting in recliner with (S). Pt still requiring min cueing for rollator management when approaching chairs/descent. Pt donned pants with (S), shirt with mod I. Pt was taken down to the atrium of the hospital to discuss community management and accessibility. Pt navigated around community obstacles with rollator with (S), education/cueing provided throughout re completion of tasks such as grocery shopping and rest break/energy conservation. Pt receptive to all. Pt was returned to his room and left supine with all needs met. Bed alarm set.   Therapy Documentation Precautions:  Precautions Precautions: Fall, Back Precaution Booklet Issued: No Precaution Comments: Reviewed log roll for comfort due to fracture Required Braces or Orthoses: (md note recommends Prevalon boot) Restrictions Weight Bearing  Restrictions: No LUE Weight Bearing: Weight bearing as tolerated RLE Weight Bearing: Weight bearing as tolerated Other Position/Activity Restrictions: Prevalon Boot for wbing  Therapy/Group: Individual Therapy  Curtis Sites 10/29/2019, 6:48 AM

## 2019-10-30 ENCOUNTER — Inpatient Hospital Stay (HOSPITAL_COMMUNITY): Payer: Medicare Other | Admitting: Occupational Therapy

## 2019-10-30 ENCOUNTER — Inpatient Hospital Stay (HOSPITAL_COMMUNITY): Payer: Medicare Other | Admitting: Physical Therapy

## 2019-10-30 ENCOUNTER — Inpatient Hospital Stay (HOSPITAL_COMMUNITY): Payer: Medicare Other | Admitting: Speech Pathology

## 2019-10-30 ENCOUNTER — Inpatient Hospital Stay (HOSPITAL_COMMUNITY): Payer: Medicare Other

## 2019-10-30 ENCOUNTER — Encounter (HOSPITAL_COMMUNITY): Payer: Medicare Other | Admitting: Psychology

## 2019-10-30 LAB — RENAL FUNCTION PANEL
Albumin: 2.2 g/dL — ABNORMAL LOW (ref 3.5–5.0)
Anion gap: 13 (ref 5–15)
BUN: 95 mg/dL — ABNORMAL HIGH (ref 6–20)
CO2: 20 mmol/L — ABNORMAL LOW (ref 22–32)
Calcium: 8.8 mg/dL — ABNORMAL LOW (ref 8.9–10.3)
Chloride: 104 mmol/L (ref 98–111)
Creatinine, Ser: 5.96 mg/dL — ABNORMAL HIGH (ref 0.61–1.24)
GFR calc Af Amer: 11 mL/min — ABNORMAL LOW (ref >60)
GFR calc non Af Amer: 10 mL/min — ABNORMAL LOW (ref >60)
Glucose, Bld: 123 mg/dL — ABNORMAL HIGH (ref 70–99)
Phosphorus: 5.8 mg/dL — ABNORMAL HIGH (ref 2.5–4.6)
Potassium: 4.8 mmol/L (ref 3.5–5.1)
Sodium: 137 mmol/L (ref 135–145)

## 2019-10-30 LAB — GLUCOSE, CAPILLARY
Glucose-Capillary: 118 mg/dL — ABNORMAL HIGH (ref 70–99)
Glucose-Capillary: 258 mg/dL — ABNORMAL HIGH (ref 70–99)
Glucose-Capillary: 117 mg/dL — ABNORMAL HIGH (ref 70–99)
Glucose-Capillary: 155 mg/dL — ABNORMAL HIGH (ref 70–99)

## 2019-10-30 LAB — UREA NITROGEN, URINE: Urea Nitrogen, Ur: 413 mg/dL

## 2019-10-30 NOTE — Progress Notes (Signed)
Speech Language Pathology Daily Session Note  Patient Details  Name: Barry Lewis MRN: [id number] Date of Birth: Feb 14, 1961  Today's Date: 10/30/2019 SLP Individual Time: 1015-1110 SLP Individual Time Calculation (min): 55 min  Short Term Goals: Week 2: SLP Short Term Goal 1 (Week 2): STGs=LTGs due to ELOS  Skilled Therapeutic Interventions: Skilled treatment session focused on cognitive goals. SLP facilitated session by providing extra time and supervision level verbal cues for attention and working memory during a 1-back card task. However, as task increased in complexity (2-back), patient required Max A multimodal cues. Patient initiated conversation in regards to task he can participate in at home to maximize memory. Educated patient on how simple household tasks can maximize cognitive functioning but also suggested some extra tasks he can participate in at home to keep his mind cognitively active. Patient verbalized understanding. Patient left upright in recliner with alarm on and all needs within reach. Continue with current plan of care.      Pain No/Denies Pain   Therapy/Group: Individual Therapy  [name] 10/30/2019, 12:42 PM
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
## 2019-10-30 NOTE — Plan of Care (Signed)
  Problem: Consults Goal: RH GENERAL PATIENT EDUCATION Description: See Patient Education module for education specifics. Outcome: Progressing Goal: Skin Care Protocol Initiated - if Braden Score 18 or less Description: If consults are not indicated, leave blank or document N/A Outcome: Progressing Goal: Diabetes Guidelines if Diabetic/Glucose > 140 Description: If diabetic or lab glucose is > 140 mg/dl - Initiate Diabetes/Hyperglycemia Guidelines & Document Interventions  Outcome: Progressing   Problem: RH SKIN INTEGRITY Goal: RH STG SKIN FREE OF INFECTION/BREAKDOWN Description: Patients skin will remain free from further infection or breakdown with mod I assist. Outcome: Progressing Goal: RH STG MAINTAIN SKIN INTEGRITY WITH ASSISTANCE Description: STG Maintain Skin Integrity With mod I Assistance. Outcome: Progressing   Problem: RH SAFETY Goal: RH STG ADHERE TO SAFETY PRECAUTIONS W/ASSISTANCE/DEVICE Description: STG Adhere to Safety Precautions With supervision/cues Assistance/Device. Outcome: Progressing

## 2019-10-30 NOTE — Progress Notes (Signed)
Barry Lewis PHYSICAL MEDICINE & REHABILITATION PROGRESS NOTE   Subjective/Complaints:  Up in hallway. Had a reasonable weekend. Likes darco shoe  ROS: Patient denies fever, rash, sore throat, blurred vision, nausea, vomiting, diarrhea, cough, shortness of breath or   headache, or mood change.     Objective:   US RENAL  Result Date: 10/28/2019 CLINICAL DATA:  Acute kidney injury EXAM: RENAL / URINARY TRACT ULTRASOUND COMPLETE COMPARISON:  Ultrasound 10/14/2019 FINDINGS: Right Kidney: Renal measurements: 11.6 x 5.1 x 6 cm = volume: 176.3 mL . Echogenicity within normal limits. No mass or hydronephrosis visualized. Left Kidney: Renal measurements: 10.8 x 6.2 x 5.7 cm = volume: 199 mL. Echogenicity within normal limits. No mass or hydronephrosis visualized. Bladder: Appears normal for degree of bladder distention. Other: Prevoid bladder volume 290 cc. IMPRESSION: Negative renal ultrasound Electronically Signed   By: Donavan Foil M.D.   On: 10/28/2019 15:58   No results for input(s): WBC, HGB, HCT, PLT in the last 72 hours. Recent Labs    10/29/19 0521 10/30/19 0716  NA 137 137  K 4.8 4.8  CL 105 104  CO2 20* 20*  GLUCOSE 101* 123*  BUN 95* 95*  CREATININE 5.91* 5.96*  CALCIUM 8.8* 8.8*    Intake/Output Summary (Last 24 hours) at 10/30/2019 1035 Last data filed at 10/30/2019 0842 Gross per 24 hour  Intake 1200 ml  Output 1950 ml  Net -750 ml     Physical Exam: Vital Signs Blood pressure (!) 150/76, pulse 79, temperature 98.4 F (36.9 C), temperature source Oral, resp. rate 19, height 5\' 6"  (1.676 m), weight 86.5 kg, SpO2 96 %. Constitutional: No distress . Vital signs reviewed. HEENT: EOMI, oral membranes moist Neck: supple Cardiovascular: RRR without murmur. No JVD    Respiratory: CTA Bilaterally without wheezes or rales. Normal effort    GI: BS +, non-tender, non-distended  Musculoskeletal:     Comments: right chest wall tenderness    Neurological: He is alert.   Oriented to place person. Fair insight. STM deficits present.  Motor: Bilateral upper extremities: 4+/5 proximal distal right lower extremity: Hip flexion, knee extension 4/5, ankle dorsiflexion 4+/5 Left lower extremity: Hip flexion, knee extension 4 -/5, ankle dorsiflexion 4+/5. Decreased LT in both feet.  Skin:  Eschar/wound right 1st MT Psychiatric:pleasant and in good spirits.     Assessment/Plan: 1. Functional deficits secondary to encephalopathy, polytrauma which require 3+ hours per day of interdisciplinary therapy in a comprehensive inpatient rehab setting.  Physiatrist is providing close team supervision and 24 hour management of active medical problems listed below.  Physiatrist and rehab team continue to assess barriers to discharge/monitor patient progress toward functional and medical goals  Care Tool:  Bathing    Body parts bathed by patient: Right arm, Left arm, Chest, Abdomen, Front perineal area, Right upper leg, Left upper leg, Right lower leg, Left lower leg, Face, Buttocks   Body parts bathed by helper: Buttocks     Bathing assist Assist Level: Supervision/Verbal cueing     Upper Body Dressing/Undressing Upper body dressing   What is the patient wearing?: Pull over shirt    Upper body assist Assist Level: Independent    Lower Body Dressing/Undressing Lower body dressing      What is the patient wearing?: Pants     Lower body assist Assist for lower body dressing: Supervision/Verbal cueing     Toileting Toileting    Toileting assist Assist for toileting: Supervision/Verbal cueing Assistive Device Comment: urinal   Transfers  Chair/bed transfer  Transfers assist     Chair/bed transfer assist level: Supervision/Verbal cueing     Locomotion Ambulation   Ambulation assist   Ambulation activity did not occur: Safety/medical concerns(used cane PTA, unsafe to attempt)  Assist level: Contact Guard/Touching assist Assistive device:  Rollator Max distance: 150'   Walk 10 feet activity   Assist  Walk 10 feet activity did not occur: Safety/medical concerns(unsafe to attempt w/cane)  Assist level: Contact Guard/Touching assist Assistive device: Rollator   Walk 50 feet activity   Assist Walk 50 feet with 2 turns activity did not occur: Safety/medical concerns  Assist level: Contact Guard/Touching assist Assistive device: Rollator    Walk 150 feet activity   Assist Walk 150 feet activity did not occur: Safety/medical concerns  Assist level: Contact Guard/Touching assist Assistive device: Rollator    Walk 10 feet on uneven surface  activity   Assist Walk 10 feet on uneven surfaces activity did not occur: Safety/medical concerns         Wheelchair     Assist Will patient use wheelchair at discharge?: No             Wheelchair 50 feet with 2 turns activity    Assist            Wheelchair 150 feet activity     Assist          Blood pressure (!) 150/76, pulse 79, temperature 98.4 F (36.9 C), temperature source Oral, resp. rate 19, height 5\' 6"  (1.676 m), weight 86.5 kg, SpO2 96 %.  Medical Problem List and Plan: 1.  Debility/encephalopathy secondary to polytrauma.             -patient may not shower ,              -ELOS 2/18  CIR therapies including PT and OT and SLP  -  darco shoe for right foot to off load 1st MT head while standing   -mod I goals given lack of supervision at home.     2.  Antithrombotics: -DVT/anticoagulation:  SQ Heparin             -antiplatelet therapy: N/A 3. Pain Management/Chronic back pain: Neurontin 300 mg nightly, trazodone 200 mg nightly oxycodone as needed   2/10: Added kpad for lower back and lidocaine patch for right rib pain  2/11 will increase lidocaine patches to 2 daily. Not anxious to increase oxycodone further given cognition, total dose, etc  2/15 trying to wean down to 5mg  oxy 4. Mood:/Dementia With history of bipolar  disorder.  Effexor 225 mg daily             -antipsychotic agents: Zyprexa 5 mg nightly  -anxiety likely a factor in pain. 5. Neuropsych: This patient is capable of making decisions on his own behalf.  2/10: Telesitter may be d/ced 6. Skin/Wound Care:   7. Fluids/Electrolytes/Nutrition: Routine in and outs.  CMP ordered. 8.  Acute on chronic renal failure stage IV.  Baseline creatinine 2.2-2.7.  Follow-up renal services.   Lasix 40mg  daily being held d/t incr Cr                2/15- Cr stable after holding lasix. 5.96  -nephro is following  -renal u/s negative  9.  Rhabdomyolysis.  CK trending down. 10.  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  Hemoglobin A1c 8.5.  Lantus insulin 15 units daily.  Check blood sugars before meals and at bedtime.  Diabetic  teaching             2/5 sugars remain elevated.   2/9 increased lantus to 18u daily.   -cover sith SSI  2/12 fair control---follow for further pattern    CBG (last 3)  Recent Labs    10/29/19 1659 10/29/19 2125 10/30/19 0609  GLUCAP 192* 149* 118*    Controlled 2/14  11. Hypertension.Norvasc 10 mg daily,Hydralazine 25 mg every 8 hours             2/9 some increase today. Follow for more consistent pattern   -no changes at present  2/12 intermittent elevation often d/t pain/anxiety 12.  Combined systolic and diastolic congestive heart failure.  Monitor for any signs of fluid overload.  Continue diuresis               Filed Weights   10/25/19 0416 10/26/19 0628 10/30/19 0454  Weight: 84.1 kg 83.2 kg 86.5 kg   None recorded x 3 d order written 2/4 13.  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  Continue Prevalon boot.  Follow-up outpatient Dr. Sharol Given to plan for debridement.  No surgical intervention necessary at this time  -see above #6 14.  Left pleural effusion with history of COPD.  Continue diuresis.  May need thoracentesis.   Follow-up chest x-ray as needed  IS, OOB 15.  Destructive process involving sternum suspect old fracture versus infection.  Latest blood cultures no growth to date.  Radiologist reviewed likely chronic smoldering process.  Follow-up outpatient CVTS.  -rx pain as needed, appears controlled  2/8 likely cause of w/e chest pain.       Appreciate cardiology follow up 16.  T3 as well as T1 and T2 right transverse process fractures.  Conservative care per Dr. Ashok Pall 17.  Right scapular fracture.  Weightbearing as tolerated 18.  Bilateral rib fractures.  Conservative care. 19.  History of polysubstance abuse tobacco and alcohol abuse.  Urine drug screen negative.  Provide counseling 20.  Medical noncompliance.  Provide counseling     LOS: 12 days A FACE TO FACE EVALUATION WAS PERFORMED  Barry Lewis 10/30/2019, 10:35 AM

## 2019-10-30 NOTE — Progress Notes (Addendum)
[facility] Progress Note    Assessment/ Plan:   1. AKI on CKD IV, nonoliguric: Suspected intravascular depletion contributing to decline in kidney function. Lasix has been held. May also be new baseline for patient. Cr stable at 5.82>6.3>5.91> 5.96, baseline 2.2-2.7. Appears euvolemic on exam this AM. ~1724mL UOP overnight. Electrolytes remain stable.  Fractional excretion of urea pending. Endorsed dysuria and hesitancy. Last UA on 2/13 negative for infection. Will obtain urine culture and check bladder scan.  - cont to hold diuretics - monitor overnight - follow up on Urine urea in order to calculate FEUrea - follow up urine culture - follow up bladder scan - patient will need to establish care outpatient to begin HD discussion   2. Hypertension: BP remains elevated with SBP 150's-190's. Current meds: Coreg 6.25mg  BID, Hydralazine 100mg  TID, and Norvasc. HR 80's.  - continue current meds  3. Hyperkalemia: resolved. K4.8 today.  - continue to monitor  4. Secondary hyperparathyroidism: PTH 121, Ca ~10, Phos 5.8.  - cont Calcium acetate TID with meals  - monitor Ca levels  5. Anemia of CKD: Hgb 10.2 on 2/4. Iron sat 26. Will Monitor.    6. Nutrition: Albumin 2.2.  Ensure enlive BID.   7. 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  8. IDDM: On insulin, per primary.   Disposition: Outpatient nephrology follow up scheduled.    Subjective:   Patient pleasantly conversing this morning. Working well with PT, ambulating in hallway. Seems in good spirits. Does endorse some dysuria and difficulty with urinating.  Denies any other concerns or complaints.   Objective:   BP (!) 150/76 (BP Location: Right Arm)   Pulse 79   Temp 98.4 F (36.9 C) (Oral)   Resp 19   Ht 5\' 6"  (1.676 m)   Wt 86.5 kg   SpO2 96%   BMI 30.78 kg/m   Intake/Output Summary (Last 24 hours) at 10/30/2019 1113 Last data filed at 10/30/2019 [id number] Gross per 24 hour  Intake  1200 ml  Output 1950 ml  Net -750 ml   Weight change:   Physical Exam: General: pleasant older male, sitting up comfortably in hallway, well nourished, well developed, in no acute distress with non-toxic appearance CV: regular rate and rhythm without murmurs, rubs, or gallops, trace lower extremity edema Lungs: clear to auscultation bilaterally with normal work of breathing Abdomen: soft, non-tender, non-distended Skin: warm, dry Extremities: warm and well perfused  Imaging: US RENAL  Result Date: 10/28/2019 CLINICAL DATA:  Acute kidney injury EXAM: RENAL / URINARY TRACT ULTRASOUND COMPLETE COMPARISON:  Ultrasound 10/14/2019 FINDINGS: Right Kidney: Renal measurements: 11.6 x 5.1 x 6 cm = volume: 176.3 mL . Echogenicity within normal limits. No mass or hydronephrosis visualized. Left Kidney: Renal measurements: 10.8 x 6.2 x 5.7 cm = volume: 199 mL. Echogenicity within normal limits. No mass or hydronephrosis visualized. Bladder: Appears normal for degree of bladder distention. Other: Prevoid bladder volume 290 cc. IMPRESSION: Negative renal ultrasound Electronically Signed   By: Donavan Foil M.D.   On: 10/28/2019 15:58    Labs: BMET Recent Labs  Lab 10/24/19 0853 10/25/19 1040 10/26/19 0613 10/27/19 0829 10/28/19 1020 10/29/19 0521 10/30/19 0716  NA 134* 134* 136 135 136 137 137  K 5.4* 5.2* 4.9 5.2* 5.3* 4.8 4.8  CL 98 97* 97* 99 102 105 104  CO2 23 22 22 22 22  20* 20*  GLUCOSE 173* 252* 124* 173* 148* 101* 123*  BUN 98* 96* 95* 98* 104*  95* 95*  CREATININE 5.15* 5.23* 5.57* 5.82* 6.33* 5.91* 5.96*  CALCIUM 8.2* 8.5* 8.8* 8.6* 8.7* 8.8* 8.8*  PHOS 5.7* 6.3* 6.8* 6.5* 5.9* 5.7* 5.8*   CBC No results for input(s): WBC, NEUTROABS, HGB, HCT, MCV, PLT in the last 168 hours.  Medications:    . amLODipine  10 mg Oral Daily  . calcium acetate  1,334 mg Oral TID WC  . carvedilol  6.25 mg Oral BID WC  . collagenase   Topical BID  . docusate sodium  100 mg Oral BID  . feeding  supplement (NEPRO CARB STEADY)  237 mL Oral BID BM  . ferrous sulfate  325 mg Oral Q breakfast  . fluticasone  1 spray Each Nare Daily  . folic acid  1 mg Oral Daily  . gabapentin  300 mg Oral QHS  . heparin  5,000 Units Subcutaneous Q8H  . hydrALAZINE  100 mg Oral Q8H  . insulin aspart  0-15 Units Subcutaneous TID WC  . insulin glargine  18 Units Subcutaneous Daily  . lidocaine  2 patch Transdermal Q24H  . OLANZapine  5 mg Oral QHS  . polyethylene glycol  17 g Oral Daily  . sodium bicarbonate  1,300 mg Oral BID  . thiamine  100 mg Oral Daily  . traZODone  200 mg Oral QHS  . venlafaxine XR  225 mg Oral Q breakfast    Mina Marble, DO Family Medicine Resident PGY-2  10/30/2019, 11:13 AM   I have seen and examined this patient and agree with plan and assessment in the above note with renal recommendations/intervention highlighted.  Creatinine has reached a plateau, however he remains without uremic symptoms.  Will continue to follow renal function while he remains an inpatient and will arrange for outpatient follow up.  No indication for dialysis at this time. Governor Rooks Brylynn Hanssen,MD 10/30/2019 3:28 PM

## 2019-10-30 NOTE — Progress Notes (Signed)
Physical Therapy Session Note  Patient Details  Name: Barry Lewis MRN: [id number] Date of Birth: 14-Nov-1960  Today's Date: 10/30/2019 PT Individual Time: 0802-0857 PT Individual Time Calculation (min): 55 min   Short Term Goals: Week 2:  PT Short Term Goal 1 (Week 2): =LTGs due to ELOS  Skilled Therapeutic Interventions/Progress Updates:   Pt in supine and agreeable to therapy w/ max encouragement, states he is very tired, pain 7/10 in ribs. Supine>sit w/ supervision and pt voided in urinal w/ set-up assist. Sit<>stand w/ supervision to rollator and ambulated to/from gym and around unit w/ close supervision to CGA, 100-150' at a time. Seated rest breaks 2/2 fatigue. Occasional verbal and visual cues for rollator management and to remember brakes. Practiced floor transfers in gym, visual and verbal cues for technique first. Min assist needed to safely get onto floor mat, CGA and verbal cues for technique getting back to mat. Instructed to get into quadruped and crawl to mat (simulating couch or bed at home). Performed x2 reps in total. Extensive discussion this session regarding fall risk education. Discussed slowing life down w/ household mobility, using rollator for IADLs and chores, pacing for energy conservation, and lifestyle modifications. Pt afraid this will make him "lazy", educated that it's making him safe and not lazy. Pt verbalized understanding of all education and in agreement. Worked on standing balance w/ standing to match cards w/o UE support. Stood in multiple 45-60 sec bouts w/ CGA and verbal cues for weight shift balance strategies to reach for cards. Ambulated back to room. Pt able to independently recall events of session and therapist wrote in memory book. Ended session in recliner, all needs in reach.    Therapy Documentation Precautions:  Precautions Precautions: Fall, Back Precaution Booklet Issued: No Precaution Comments: Reviewed log roll for comfort due to  fracture Required Braces or Orthoses: (md note recommends Prevalon boot) Restrictions Weight Bearing Restrictions: No LUE Weight Bearing: Weight bearing as tolerated RLE Weight Bearing: Weight bearing as tolerated Other Position/Activity Restrictions: Prevalon Boot for wbing Vital Signs: Therapy Vitals Pulse Rate: 79 BP: (!) 150/76 Patient Position (if appropriate): Lying Pain: Pain Assessment Pain Scale: 0-10 Pain Score: 6  Pain Type: Chronic pain Pain Location: Rib cage Pain Orientation: Right Pain Descriptors / Indicators: Sharp Pain Frequency: Intermittent Pain Onset: On-going Patients Stated Pain Goal: 5 Pain Intervention(s): Medication (See eMAR)  Therapy/Group: Individual Therapy  Rockell Faulks Clent Demark 10/30/2019, 8:59 AM

## 2019-10-30 NOTE — Progress Notes (Signed)
Occupational Therapy Session Note  Patient Details  Name: Barry Lewis MRN: [id number] Date of Birth: Apr 06, 1961  Today's Date: 10/30/2019 OT Individual Time: 1300-1410 OT Individual Time Calculation (min): 70 min    Short Term Goals: Week 2:  OT Short Term Goal 1 (Week 2): STG= LTG d/t ELOS  Skilled Therapeutic Interventions/Progress Updates:    pt received sitting up in the recliner with c/o rib pain, 4/10, requesting shower as pain relief. Pt's IV and R LE were occluded for shower. Emphasis of session on progressing to mod I with showering /dressing tasks and transitioning out cueing. Pt used rollator to complete ambulatory transfer into the bathroom with (S), good management of rollator features/locking brakes. Pt required min cueing for completing LB dressing seated. Pt able to shower at mod I level overall with intermittent lapses in safety awareness/judgement but overall safe. Pt able to set himself up for shower, getting out appropriate supplies and keeping them within an arms reach. Pt very careless following shower, breaking back precautions several times and becoming frustrated with OT cues for breathing techniques and remaining seated to dry legs. Edu pt on reasoning and fall risk.  Pt transferred back to recliner after donning darco boot and Rn was consulted for dressing change on B LE. Pt was then taken downstairs via w/c to practice uneven surface functional mobility. Pt was able to use rollator to ambulate over bricks with no LOB and then good use of rollator to take seated rest break. Pt was returned to his room and left sitting up with all needs met.   Therapy Documentation Precautions:  Precautions Precautions: Fall, Back Precaution Booklet Issued: No Precaution Comments: Reviewed log roll for comfort due to fracture Required Braces or Orthoses: (md note recommends Prevalon boot) Restrictions Weight Bearing Restrictions: No LUE Weight Bearing: Weight bearing as  tolerated RLE Weight Bearing: Weight bearing as tolerated Other Position/Activity Restrictions: Prevalon Boot for wbing  Therapy/Group: Individual Therapy  Curtis Sites 10/30/2019, 1:04 PM

## 2019-10-31 ENCOUNTER — Inpatient Hospital Stay (HOSPITAL_COMMUNITY): Payer: Medicare Other | Admitting: Occupational Therapy

## 2019-10-31 ENCOUNTER — Inpatient Hospital Stay (HOSPITAL_COMMUNITY): Payer: Medicare Other | Admitting: Speech Pathology

## 2019-10-31 ENCOUNTER — Inpatient Hospital Stay (HOSPITAL_COMMUNITY): Payer: Medicare Other

## 2019-10-31 ENCOUNTER — Inpatient Hospital Stay (HOSPITAL_COMMUNITY): Payer: Medicare Other | Admitting: Physical Therapy

## 2019-10-31 LAB — URINE CULTURE

## 2019-10-31 LAB — RENAL FUNCTION PANEL
Albumin: 2.4 g/dL — ABNORMAL LOW (ref 3.5–5.0)
Anion gap: 12 (ref 5–15)
BUN: 99 mg/dL — ABNORMAL HIGH (ref 6–20)
CO2: 20 mmol/L — ABNORMAL LOW (ref 22–32)
Calcium: 9.1 mg/dL (ref 8.9–10.3)
Chloride: 103 mmol/L (ref 98–111)
Creatinine, Ser: 5.72 mg/dL — ABNORMAL HIGH (ref 0.61–1.24)
GFR calc Af Amer: 12 mL/min — ABNORMAL LOW (ref >60)
GFR calc non Af Amer: 10 mL/min — ABNORMAL LOW (ref >60)
Glucose, Bld: 125 mg/dL — ABNORMAL HIGH (ref 70–99)
Phosphorus: 5.8 mg/dL — ABNORMAL HIGH (ref 2.5–4.6)
Potassium: 5.1 mmol/L (ref 3.5–5.1)
Sodium: 135 mmol/L (ref 135–145)

## 2019-10-31 LAB — GLUCOSE, CAPILLARY
Glucose-Capillary: 124 mg/dL — ABNORMAL HIGH (ref 70–99)
Glucose-Capillary: 143 mg/dL — ABNORMAL HIGH (ref 70–99)
Glucose-Capillary: 239 mg/dL — ABNORMAL HIGH (ref 70–99)
Glucose-Capillary: 152 mg/dL — ABNORMAL HIGH (ref 70–99)

## 2019-10-31 MED ORDER — OXYCODONE HCL 5 MG PO TABS
5.0000 mg | ORAL_TABLET | Freq: Four times a day (QID) | ORAL | Status: DC | PRN
  Administered 2019-10-31 – 2019-11-03 (×8): 5 mg via ORAL
  Filled 2019-10-31 (×8): qty 1

## 2019-10-31 MED ORDER — CARVEDILOL 12.5 MG PO TABS
12.5000 mg | ORAL_TABLET | Freq: Two times a day (BID) | ORAL | Status: DC
  Administered 2019-10-31 – 2019-11-03 (×6): 12.5 mg via ORAL
  Filled 2019-10-31 (×6): qty 1

## 2019-10-31 NOTE — Progress Notes (Signed)
Occupational Therapy Session Note  Patient Details  Name: Barry Lewis MRN: [id number] Date of Birth: September 11, 1961  Today's Date: 10/31/2019 OT Individual Time: [id number] OT Individual Time Calculation (min): 33 min    Short Term Goals: Week 2:  OT Short Term Goal 1 (Week 2): STG= LTG d/t ELOS  Skilled Therapeutic Interventions/Progress Updates:    Pt seen in bed at start of session.  Discussed pt's report of having dizziness at times and worked on determining if the cause of his dizziness is vestibular related.  Pt completed supine to sit with supervision and reports a light headed feeling that goes away within a few seconds, no nystagmus noted.  Had him transition back to supine with supervision without any report of light headedness of dizziness.  Tested all inner ear canals from bed level with all tests being negative for dizziness or nystagmus.  Had pt transition back to sitting and tested his vision.  Tracking, saccades, convergence, VOR were all within functional limits.  No report of dizziness with rapid head turns side to side or with sit to stand, which he was able to complete with supervision after donning his right darko shoe.  Finished session with transfer back to bed per pt request.  Call button and phone in reach with safety alarm on bed.  Memory notebook filled out as well.  It appears based on testing that light headedness may be related to BP, but continue to monitor for changes or nystagmus in therapy.    Therapy Documentation Precautions:  Precautions Precautions: Fall, Back Precaution Booklet Issued: No Precaution Comments: Reviewed log roll for comfort due to fracture Required Braces or Orthoses: (md note recommends Prevalon boot) Restrictions Weight Bearing Restrictions: No LUE Weight Bearing: Weight bearing as tolerated RLE Weight Bearing: Weight bearing as tolerated Other Position/Activity Restrictions: Prevalon Boot for wbing   Vital Signs: Therapy Vitals Pulse  Rate: 86 BP: (!) 186/92 Patient Position (if appropriate): Lying Pain: Pain Assessment Pain Scale: 0-10 Pain Score: 8  Pain Type: Acute pain Pain Location: Rib cage Pain Orientation: Left Pain Descriptors / Indicators: Sharp Pain Frequency: Intermittent Pain Onset: Gradual Patients Stated Pain Goal: 2 Pain Intervention(s): Medication (See eMAR)  Therapy/Group: Individual Therapy  Canary Fister OTR/L 10/31/2019, 12:15 PM

## 2019-10-31 NOTE — Plan of Care (Signed)
  Problem: Consults Goal: RH GENERAL PATIENT EDUCATION Description: See Patient Education module for education specifics. Outcome: Progressing Goal: Skin Care Protocol Initiated - if Braden Score 18 or less Description: If consults are not indicated, leave blank or document N/A Outcome: Progressing Goal: Diabetes Guidelines if Diabetic/Glucose > 140 Description: If diabetic or lab glucose is > 140 mg/dl - Initiate Diabetes/Hyperglycemia Guidelines & Document Interventions  Outcome: Progressing   Problem: RH SKIN INTEGRITY Goal: RH STG SKIN FREE OF INFECTION/BREAKDOWN Description: Patients skin will remain free from further infection or breakdown with mod I assist. Outcome: Progressing Goal: RH STG MAINTAIN SKIN INTEGRITY WITH ASSISTANCE Description: STG Maintain Skin Integrity With mod I Assistance. Outcome: Progressing Goal: RH STG ABLE TO PERFORM INCISION/WOUND CARE W/ASSISTANCE Description: STG Able To Perform Incision/Wound Care With mod I Assistance. Outcome: Progressing   Problem: RH SAFETY Goal: RH STG ADHERE TO SAFETY PRECAUTIONS W/ASSISTANCE/DEVICE Description: STG Adhere to Safety Precautions With supervision/cues Assistance/Device. Outcome: Progressing

## 2019-10-31 NOTE — Progress Notes (Signed)
Occupational Therapy Session Note  Patient Details  Name: Barry Lewis MRN: [id number] Date of Birth: 16-Nov-1960  Today's Date: 10/31/2019 OT Individual Time: 1415-1454 OT Individual Time Calculation (min): 39 min    Short Term Goals: Week 2:  OT Short Term Goal 1 (Week 2): STG= LTG d/t ELOS  Skilled Therapeutic Interventions/Progress Updates:    Pt received sleeping, very irritated about therapy session but agreeable when reminded of upcoming d/c. Pt completed bed mobility to EOB with mod I. Pt declined any ADLs but requested to go outside. Pt used rollator to transfer into the bathroom and complete standing level urination. Pt completed 100 ft of w/c propulsion for BUE endurance and strengthening. Pt completed functional mobility outdoors over uneven surfaces to challenge dynamic standing balance. Discussed need for continued support and planning for the future, pt receptive but dismissive of current deficits. Pt returned inside and was left sitting up in the recliner with all needs met. Provided pt with handout re energy conservation strategies.   Therapy Documentation Precautions:  Precautions Precautions: Fall, Back Precaution Booklet Issued: No Precaution Comments: Reviewed log roll for comfort due to fracture Required Braces or Orthoses: (md note recommends Prevalon boot) Restrictions Weight Bearing Restrictions: No LUE Weight Bearing: Weight bearing as tolerated RLE Weight Bearing: Weight bearing as tolerated Other Position/Activity Restrictions: Prevalon Boot for wbing   Therapy/Group: Individual Therapy  Curtis Sites 10/31/2019, 12:56 PM

## 2019-10-31 NOTE — Progress Notes (Signed)
Patient ID: Barry Lewis, male   DOB: 1961/04/06, 59 y.o.   MRN: [id number] S: Feeling well, no complaints O:BP (!) 179/98   Pulse 85   Temp 98.5 F (36.9 C) (Oral)   Resp 17   Ht 5\' 6"  (1.676 m)   Wt 82.9 kg   SpO2 93%   BMI 29.50 kg/m   Intake/Output Summary (Last 24 hours) at 10/31/2019 1117 Last data filed at 10/31/2019 1108 Gross per 24 hour  Intake 1164 ml  Output 3500 ml  Net -2336 ml   Intake/Output: I/O last 3 completed shifts: In: [id number] [P.O.:1404] Out: 5150 [Urine:5150]  Intake/Output this shift:  Total I/O In: 480 [P.O.:480] Out: 300 [Urine:300] Weight change: -3.6 kg Gen: NAD CVS: no rub Resp: cta Abd: benign Ext: trace edema right foot.  Recent Labs  Lab 10/25/19 1040 10/26/19 0613 10/27/19 0829 10/28/19 1020 10/29/19 0521 10/30/19 0716 10/31/19 0556  NA 134* 136 135 136 137 137 135  K 5.2* 4.9 5.2* 5.3* 4.8 4.8 5.1  CL 97* 97* 99 102 105 104 103  CO2 22 22 22 22  20* 20* 20*  GLUCOSE 252* 124* 173* 148* 101* 123* 125*  BUN 96* 95* 98* 104* 95* 95* 99*  CREATININE 5.23* 5.57* 5.82* 6.33* 5.91* 5.96* 5.72*  ALBUMIN 2.1* 2.2* 2.1* 2.3* 2.2* 2.2* 2.4*  CALCIUM 8.5* 8.8* 8.6* 8.7* 8.8* 8.8* 9.1  PHOS 6.3* 6.8* 6.5* 5.9* 5.7* 5.8* 5.8*   Liver Function Tests: Recent Labs  Lab 10/29/19 0521 10/30/19 0716 10/31/19 0556  ALBUMIN 2.2* 2.2* 2.4*   No results for input(s): LIPASE, AMYLASE in the last 168 hours. No results for input(s): AMMONIA in the last 168 hours. CBC: No results for input(s): WBC, NEUTROABS, HGB, HCT, MCV, PLT in the last 168 hours. Cardiac Enzymes: No results for input(s): CKTOTAL, CKMB, CKMBINDEX, TROPONINI in the last 168 hours. CBG: Recent Labs  Lab 10/30/19 0609 10/30/19 1140 10/30/19 1700 10/30/19 2115 10/31/19 0614  GLUCAP 118* 258* 117* 155* 124*    Iron Studies: No results for input(s): IRON, TIBC, TRANSFERRIN, FERRITIN in the last 72 hours. Studies/Results: No results found. [name] amLODipine  10 mg Oral Daily  .  calcium acetate  1,334 mg Oral TID WC  . carvedilol  6.25 mg Oral BID WC  . docusate sodium  100 mg Oral BID  . feeding supplement (NEPRO CARB STEADY)  237 mL Oral BID BM  . ferrous sulfate  325 mg Oral Q breakfast  . fluticasone  1 spray Each Nare Daily  . folic acid  1 mg Oral Daily  . gabapentin  300 mg Oral QHS  . heparin  5,000 Units Subcutaneous Q8H  . hydrALAZINE  100 mg Oral Q8H  . insulin aspart  0-15 Units Subcutaneous TID WC  . insulin glargine  18 Units Subcutaneous Daily  . lidocaine  2 patch Transdermal Q24H  . OLANZapine  5 mg Oral QHS  . polyethylene glycol  17 g Oral Daily  . sodium bicarbonate  1,300 mg Oral BID  . thiamine  100 mg Oral Daily  . traZODone  200 mg Oral QHS  . venlafaxine XR  225 mg Oral Q breakfast    BMET    Component Value Date/Time   NA 135 10/31/2019 0556   K 5.1 10/31/2019 0556   CL 103 10/31/2019 0556   CO2 20 (L) 10/31/2019 0556   GLUCOSE 125 (H) 10/31/2019 0556   BUN 99 (H) 10/31/2019 0556   CREATININE 5.72 (H) 10/31/2019 [id number]  CALCIUM 9.1 10/31/2019 0556   CALCIUM 7.8 (L) 10/21/2019 1211   GFRNONAA 10 (L) 10/31/2019 0556   GFRAA 12 (L) 10/31/2019 0556   CBC    Component Value Date/Time   WBC 5.4 10/19/2019 0521   RBC 3.18 (L) 10/19/2019 0521   HGB 10.2 (L) 10/19/2019 0521   HCT 31.3 (L) 10/19/2019 0521   PLT 260 10/19/2019 0521   MCV 98.4 10/19/2019 0521   MCH 32.1 10/19/2019 0521   MCHC 32.6 10/19/2019 0521   RDW 13.4 10/19/2019 0521   LYMPHSABS 0.8 10/19/2019 0521   MONOABS 0.6 10/19/2019 0521   EOSABS 0.3 10/19/2019 0521   BASOSABS 0.0 10/19/2019 0521     Assessment/Plan:  1. AKI/CKD stage 4- has history of AKI/CKD back in September 2020 when he presented with fall and multiple fractures, and required transient HD at that time.  Cr peaked at 7.l24 but improved off of HD to 2.87 on 05/17/19.  Cr peaked at 6.33 this admission and is slowly improving.  No indication for dialysis and will need to follow up closely with  his Nephrologist in [place] area. 2. HTN- poorly controlled.  Increase coreg to 12.5 mg bid and follow HR and bp. 3. Hyperkalemia- resolved 4. Anemia of CKD- stable 5. IDDM- per primary  Donetta Potts, MD [facility] [phone]

## 2019-10-31 NOTE — Progress Notes (Signed)
Pts IV was pulled out of left arm during sleep. There was a moderate amount of blood on the bed and on pts left arm. Bed linen changed, pt cleaned, and MD notified. Small abrasion on left elbow appeared to be reopened, area cleaned and Band-Aid applied. Vitals stable and pt sleeping at this time.

## 2019-10-31 NOTE — Progress Notes (Signed)
PHYSICAL MEDICINE & REHABILITATION PROGRESS NOTE   Subjective/Complaints:  No new complaints. Having pain but feels it's tolerable. 5mg  oxy is helping  ROS: Patient denies fever, rash, sore throat, blurred vision, nausea, vomiting, diarrhea, cough, shortness of breath or chest pain,   headache, or mood change.     Objective:   No results found. No results for input(s): WBC, HGB, HCT, PLT in the last 72 hours. Recent Labs    10/30/19 0716 10/31/19 0556  NA 137 135  K 4.8 5.1  CL 104 103  CO2 20* 20*  GLUCOSE 123* 125*  BUN 95* 99*  CREATININE 5.96* 5.72*  CALCIUM 8.8* 9.1    Intake/Output Summary (Last 24 hours) at 10/31/2019 1102 Last data filed at 10/31/2019 0800 Gross per 24 hour  Intake 1164 ml  Output 3200 ml  Net -2036 ml     Physical Exam: Vital Signs Blood pressure (!) 179/98, pulse 85, temperature 98.5 F (36.9 C), temperature source Oral, resp. rate 17, height 5\' 6"  (1.676 m), weight 82.9 kg, SpO2 93 %. Constitutional: No distress . Vital signs reviewed. HEENT: EOMI, oral membranes moist Neck: supple Cardiovascular: RRR without murmur. No JVD    Respiratory: CTA Bilaterally without wheezes or rales. Normal effort    GI: BS +, non-tender, non-distended  Musculoskeletal:     Comments: right chest wall tenderness with palpation and deep breath    Neurological: He is alert.  Oriented to place person. Fair insight. STM deficits present.  Motor: Bilateral upper extremities: 4+/5 proximal distal right lower extremity: Hip flexion, knee extension 4/5, ankle dorsiflexion 4+/5 Left lower extremity: Hip flexion, knee extension 4 -/5, ankle dorsiflexion 4+/5. Decreased LT in both feet.  Skin:  Eschar/wound right 1st MT--somewhat decreased but without substantial change Psychiatric:pleasant and in good spirits.     Assessment/Plan: 1. Functional deficits secondary to encephalopathy, polytrauma which require 3+ hours per day of interdisciplinary  therapy in a comprehensive inpatient rehab setting.  Physiatrist is providing close team supervision and 24 hour management of active medical problems listed below.  Physiatrist and rehab team continue to assess barriers to discharge/monitor patient progress toward functional and medical goals  Care Tool:  Bathing    Body parts bathed by patient: Right arm, Left arm, Chest, Abdomen, Front perineal area, Right upper leg, Left upper leg, Right lower leg, Left lower leg, Face, Buttocks   Body parts bathed by helper: Buttocks     Bathing assist Assist Level: Supervision/Verbal cueing     Upper Body Dressing/Undressing Upper body dressing   What is the patient wearing?: Pull over shirt    Upper body assist Assist Level: Independent    Lower Body Dressing/Undressing Lower body dressing      What is the patient wearing?: Pants     Lower body assist Assist for lower body dressing: Supervision/Verbal cueing     Toileting Toileting    Toileting assist Assist for toileting: Supervision/Verbal cueing Assistive Device Comment: urinal   Transfers Chair/bed transfer  Transfers assist     Chair/bed transfer assist level: Supervision/Verbal cueing     Locomotion Ambulation   Ambulation assist   Ambulation activity did not occur: Safety/medical concerns(used cane PTA, unsafe to attempt)  Assist level: Contact Guard/Touching assist Assistive device: Rollator Max distance: 150'   Walk 10 feet activity   Assist  Walk 10 feet activity did not occur: Safety/medical concerns(unsafe to attempt w/cane)  Assist level: Contact Guard/Touching assist Assistive device: Rollator   Walk 50 feet  activity   Assist Walk 50 feet with 2 turns activity did not occur: Safety/medical concerns  Assist level: Contact Guard/Touching assist Assistive device: Rollator    Walk 150 feet activity   Assist Walk 150 feet activity did not occur: Safety/medical concerns  Assist level:  Contact Guard/Touching assist Assistive device: Rollator    Walk 10 feet on uneven surface  activity   Assist Walk 10 feet on uneven surfaces activity did not occur: Safety/medical concerns         Wheelchair     Assist Will patient use wheelchair at discharge?: No             Wheelchair 50 feet with 2 turns activity    Assist            Wheelchair 150 feet activity     Assist          Blood pressure (!) 179/98, pulse 85, temperature 98.5 F (36.9 C), temperature source Oral, resp. rate 17, height 5\' 6"  (1.676 m), weight 82.9 kg, SpO2 93 %.  Medical Problem List and Plan: 1.  Debility/encephalopathy secondary to polytrauma.             -patient may not shower ,              -ELOS 2/18 confirmed in team conf 2/16  CIR therapies including PT and OT and SLP  -  darco shoe for right foot to off load 1st MT head while standing   -mod I goals given lack of supervision at home.     2.  Antithrombotics: -DVT/anticoagulation:  SQ Heparin             -antiplatelet therapy: N/A 3. Pain Management/Chronic back pain: Neurontin 300 mg nightly, trazodone 200 mg nightly oxycodone as needed   2/10: Added kpad for lower back and lidocaine patch for right rib pain  2/11 will increase lidocaine patches to 2 daily. Not anxious to increase oxycodone further given cognition, total dose, etc  2/16 decreasing oxycodone to 5mg  q6 prn---need to further wean to reduce risk at home. Would do better perhaps with scheduled med as well.  4. Mood:/Dementia With history of bipolar disorder.  Effexor 225 mg daily             -antipsychotic agents: Zyprexa 5 mg nightly  -anxiety likely a factor in pain. 5. Neuropsych: This patient is capable of making decisions on his own behalf.  2/10: Telesitter may be d/ced 6. Skin/Wound Care:  -will dc santyl as it's not really having much affect. Can keep toe protected with kerlix wrap.  7. Fluids/Electrolytes/Nutrition: Routine in and  outs.  CMP ordered. 8.  Acute on chronic renal failure stage IV.  Baseline creatinine 2.2-2.7.  Follow-up renal services.   Lasix 40mg  daily being held d/t incr Cr                2/15- Cr stable after holding lasix. 5.96  -nephro is following  -renal u/s negative  9.  Rhabdomyolysis.  CK trending down. 10.  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  Hemoglobin A1c 8.5.  Lantus insulin 15 units daily.  Check blood sugars before meals and at bedtime.  Diabetic teaching             2/5 sugars remain elevated.   2/9 increased lantus to 18u daily.   -cover sith SSI  2/12 fair control---follow for further pattern    CBG (last 3)  Recent Labs  10/30/19 1700 10/30/19 2115 10/31/19 0614  GLUCAP 117* 155* 124*    Controlled 2/16  11. Hypertension.Norvasc 10 mg daily,Hydralazine 25 mg every 8 hours             2/9 some increase today. Follow for more consistent pattern   -no changes at present  2/12 intermittent elevation often d/t pain/anxiety 12.  Combined systolic and diastolic congestive heart failure.  Monitor for any signs of fluid overload.  Continue diuresis               Filed Weights   10/26/19 0628 10/30/19 0454 10/31/19 0431  Weight: 83.2 kg 86.5 kg 82.9 kg   weights stable 2/16 13.  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  Continue Prevalon boot.  Follow-up outpatient Dr. Sharol Given to plan for debridement.  No surgical intervention necessary at this time  -see above #6 14.  Left pleural effusion with history of COPD.  Continue diuresis.  May need thoracentesis.  Follow-up chest x-ray as needed  IS, OOB 15.  Destructive process involving sternum suspect old fracture versus infection.  Latest blood cultures no growth to date.  Radiologist reviewed likely chronic smoldering process.  Follow-up outpatient CVTS.  -rx pain as needed, appears controlled  2/8 likely cause of w/e chest pain.        Appreciate cardiology follow up 16.  T3 as well as T1 and T2 right transverse process fractures.  Conservative care per Dr. Ashok Pall 17.  Right scapular fracture.  Weightbearing as tolerated 18.  Bilateral rib fractures.  Conservative care. 19.  History of polysubstance abuse tobacco and alcohol abuse.  Urine drug screen negative.  Provide counseling 20.  Medical noncompliance.  Provide counseling     LOS: 13 days A FACE TO FACE EVALUATION WAS PERFORMED  Meredith Staggers 10/31/2019, 11:02 AM

## 2019-10-31 NOTE — Progress Notes (Signed)
Physical Therapy Session Note  Patient Details  Name: Tradd Sergi MRN: [id number] Date of Birth: 1961/06/12  Today's Date: 10/31/2019 PT Individual Time: 1035-1130 PT Individual Time Calculation (min): 55 min   Short Term Goals: Week 2:  PT Short Term Goal 1 (Week 2): =LTGs due to ELOS  Skilled Therapeutic Interventions/Progress Updates:   Pt in supine and agreeable to therapy, pain 7/10 in ribs (premedicated). Supervision bed mobility. Pt's pants w/ hole in them. Pt changed pants w/ supervision from seated level. Sit<>stand to rollator w/ supervision and ambulated to/from therapy gym and around unit w/ supervision using rollator in 100-150' bouts. Seated rest breaks in rollator 2/2 fatigue. Worked on functional balance in ADL kitchen. Required pt to gather items from fridge and set on counter, fill pot w/ water and place on stove, and remove and place dishes in cabinet above counter. Verbal reminders to lock rollator brakes prior to reaching for items and discussed that he can likely perform a lot of counter-top tasks from seated level in w/c. Educated on keeping items on counter top for easy reach as well. Pt performed all tasks w/ supervision. Discussed alternative ways to get his groceries including online ordering. Pt does not have access to computer or online at home, but feels his brother in [place] may be able to put in the order for him if pt can go with neighbor to pick up. Performed Berg Balance Scale as detailed below and explained significance of results to pt including high fall risk. Practiced gait and stair negotiation w/ cane as he will likely need to perform this at home by himself to get to neighbors house. Pt w/o phone to contact neighbor and would not be safe to attempt stairs w/ rollator or w/o any AD. In case of an emergency, would recommend he use SPC. Discussed this w/ pt and he states he will only take rollator out of the house if he has someone to bring it down the stairs for him.  Ambulated 51' and negotiated 4 steps x2 w/ close supervision w/ SPC and unilateral rail (as per home access). Verbal cues for cane technique on stairs. Ambulated back to room and ended session in supine, all needs in reach.   Therapy Documentation Precautions:  Precautions Precautions: Fall, Back Precaution Booklet Issued: No Precaution Comments: Reviewed log roll for comfort due to fracture Required Braces or Orthoses: (md note recommends Prevalon boot) Restrictions Weight Bearing Restrictions: No LUE Weight Bearing: Weight bearing as tolerated RLE Weight Bearing: Weight bearing as tolerated Other Position/Activity Restrictions: Prevalon Boot for wbing Vital Signs: Therapy Vitals Pulse Rate: 85 BP: (!) 179/98 Balance: Balance Balance Assessed: Yes Standardized Balance Assessment Standardized Balance Assessment: Berg Balance Test Berg Balance Test Sit to Stand: Able to stand without using hands and stabilize independently Standing Unsupported: Able to stand 2 minutes with supervision Sitting with Back Unsupported but Feet Supported on Floor or Stool: Able to sit safely and securely 2 minutes Stand to Sit: Sits safely with minimal use of hands Transfers: Able to transfer safely, minor use of hands Standing Unsupported with Eyes Closed: Able to stand 10 seconds with supervision Standing Ubsupported with Feet Together: Able to place feet together independently and stand for 1 minute with supervision From Standing, Reach Forward with Outstretched Arm: Can reach forward >12 cm safely (5") From Standing Position, Pick up Object from Floor: Able to pick up shoe, needs supervision From Standing Position, Turn to Look Behind Over each Shoulder: Turn sideways only but maintains  balance Turn 360 Degrees: Needs close supervision or verbal cueing Standing Unsupported, Alternately Place Feet on Step/Stool: Able to complete >2 steps/needs minimal assist Standing Unsupported, One Foot in Front:  Able to take small step independently and hold 30 seconds Standing on One Leg: Tries to lift leg/unable to hold 3 seconds but remains standing independently Total Score: 38  Therapy/Group: Individual Therapy  Raizel Wesolowski K Fadil Macmaster 10/31/2019, 11:40 AM

## 2019-10-31 NOTE — Patient Care Conference (Signed)
Inpatient RehabilitationTeam Conference and Plan of Care Update Date: 10/31/2019   Time: 10:05 AM   Patient Name: Barry Lewis      Medical Record Number: [id number]  Date of Birth: May 11, 1961 Sex: Male         Room/Bed: 4W24C/4W24C-01 Payor Info: Payor: MEDICARE / Plan: MEDICARE PART A AND B / Product Type: *No Product type* /    Admit Date/Time:  10/18/2019  1:59 PM  Primary Diagnosis:  Multiple trauma  Patient Active Problem List   Diagnosis Date Noted  . Left scapula fracture 10/22/2019  . Acute renal failure superimposed on stage 4 chronic kidney disease ([place])   . AKI (acute kidney injury) ([place])   . Chronic combined systolic and diastolic CHF (congestive heart failure) ([place])   . Multiple closed fractures of ribs of both sides   . Closed nondisplaced fracture of seventh cervical vertebra ([place])   . Compression fracture of T3 vertebra ([facility])   . Hyperglycemia   . Trauma   . Multiple trauma   . Type 2 diabetes mellitus with polyneuropathy ([place])   . Diabetic ulcer of right midfoot associated with diabetes mellitus due to underlying condition, limited to breakdown of skin ([place])   . Severe protein-calorie malnutrition ([place])   . Anasarca   . Rhabdomyolysis 10/13/2019  . Fall at home, initial encounter 10/13/2019  . Hypertension   . Dyslipidemia   . Diabetes mellitus without complication ([place])   . Dementia ([place])   . COPD (chronic obstructive pulmonary disease) ([place])   . CKD stage 4 secondary to hypertension ([place])   . Chronic diastolic (congestive) heart failure [facility])     Expected Discharge Date: Expected Discharge Date: 11/02/19  Team Members Present: Physician leading conference: Dr. Alger Simons Social Worker Present: Lennart Pall, LCSW(Auria Barbra Sarks, LCSW) Nurse Present: Other (comment)(Susan Truman Hayward, RN) Case Manager: Karene Fry, RN PT Present: Burnard Bunting, PT OT Present: Laverle Hobby, OT SLP Present: Weston Anna, SLP PPS Coordinator present : Gunnar Fusi, SLP   Current Status/Progress Goal Weekly Team Focus  Bowel/Bladder   Pt is vont of b&B  Remain cont  Assess toiletting q ddift and prn   Swallow/Nutrition/ Hydration             ADL's   mod I UB ADLs, (S) LB ADLs, (S) Transfers with rollator, still needing cueing for rollator management and back precaution adherence  mod I overall  ADL retraining, IADLs, d/c planning, functional activity tolerance   Mobility   supervision overall, gait >150' w/ rollator  mod/i  balance, endurance, d/c planning   Communication             Safety/Cognition/ Behavioral Observations  Min-ModA  Min-Mod A  recall with use of strategies, attention, problem solving, awareness   Pain   Pt c/o of pain in rt side ribs of a level of 7/10  pain less than 7  assess pain q shift and as needed   Skin   Abrasions on rt and left knee, arm and rt foot  continue to prevent furthur break down and infection  assess q shift and on an as needed basis     Rehab Goals Patient on target to meet rehab goals: Yes *See Care Plan and progress notes for long and short-term goals.     Barriers to Discharge  Current Status/Progress Possible Resolutions Date Resolved   Nursing                  PT  OT                  SLP                SW                Discharge Planning/Teaching Needs:  Pt plans to d/c to his home with only VERY intermitent assistance from daughter  NA   Team Discussion: RI, kidney failure, nephrology following, adjusting lasix, working on DM control, reducing pain meds.  RN burning on urination, knee abrasions, eschar on foot, drsg changes.  OT mod I UB, S LB ADL, S transfers with rollator, mod I goals.  PT S 150' rollator, fall risk ed, mod I goals.  SLP memory deficits, decreased safety awareness.   Revisions to Treatment Plan: N/A     Medical Summary Current Status: ongoing wound care, Cr trending back down, pain still an issue but weaning meds Weekly Focus/Goal: wounds, dm,bp,  CKD  Barriers to Discharge: Medical stability  Barriers to Discharge Comments: baseline cognitive deficits Possible Resolutions to Barriers: establishing a routine, continued safety education/cueing   Continued Need for Acute Rehabilitation Level of Care: The patient requires daily medical management by a physician with specialized training in physical medicine and rehabilitation for the following reasons: Direction of a multidisciplinary physical rehabilitation program to maximize functional independence : Yes Medical management of patient stability for increased activity during participation in an intensive rehabilitation regime.: Yes Analysis of laboratory values and/or radiology reports with any subsequent need for medication adjustment and/or medical intervention. : Yes   I attest that I was present, lead the team conference, and concur with the assessment and plan of the team.   Retta Diones 10/31/2019, 3:00 PM   Team conference was held via web/ teleconference due to [place] - 19

## 2019-10-31 NOTE — Discharge Summary (Signed)
Physician Discharge Summary  Patient ID: Barry Lewis MRN: [id number] DOB/AGE: May 11, 1961 59 y.o.  Admit date: 10/18/2019 Discharge date: 11/02/2018  Discharge Diagnoses:  Principal Problem:   Multiple trauma Active Problems:   Rhabdomyolysis   Left scapula fracture DVT prophylaxis Acute on chronic renal failure stage IV Diabetes mellitus History of bipolar disorder Hypertension Combined systolic and diastolic congestive heart failure Polysubstance abuse as well as tobacco abuse Hyperlipidemia  Discharged Condition: Stable  Significant Diagnostic Studies: CT ABDOMEN PELVIS WO CONTRAST  Result Date: 10/13/2019 CLINICAL DATA:  Initial evaluation for acute trauma, fall. EXAM: CT CHEST, ABDOMEN AND PELVIS WITHOUT CONTRAST TECHNIQUE: Multidetector CT imaging of the chest, abdomen and pelvis was performed following the standard protocol without IV contrast. COMPARISON:  Comparison made with prior CT from 04/26/2019. FINDINGS: CT CHEST FINDINGS Cardiovascular: Intrathoracic aorta normal in caliber without aneurysm. Scattered atheromatous change noted about the arch and origin of the great vessels as well as within the descending intrathoracic aorta. Cardiomegaly. Scattered 3 vessel coronary artery calcifications. No pericardial effusion. Mediastinum/Nodes: Coarse calcification noted within the left lobe of thyroid, of doubtful significance. Visualized thyroid otherwise unremarkable. No pathologically enlarged mediastinal or hilar lymph nodes identified on this noncontrast examination. No axillary adenopathy. Esophagus within normal limits. No mediastinal hematoma. Lungs/Pleura: Tracheobronchial tree intact and patent. Lungs hypoinflated with elevation of the right hemidiaphragm. Moderate to large right greater than left pleural effusions. These measure simple fluid density with no evidence for hemothorax. Superimposed ground-glass opacity in interlobular septal thickening elsewhere within the aerated  portions of the lungs, suggesting a degree of pulmonary interstitial edema. Superimposed scattered linear and parenchymal opacity within the dependent aspects of both lungs, likely atelectasis. Infection difficult to exclude. No pneumothorax. Musculoskeletal: Mild diffuse anasarca noted within the external soft tissues. There is an acute fracture of the left scapular wing (series 7, image 49). There is an acute fracture of the left anterolateral third rib, lateral left fifth and sixth ribs. Additional acute appearing fracture of the left posterolateral eleventh rib (series 7, image 164). On the right, there are acute nondisplaced fractures of the right lateral 6 and likely seventh ribs, as well as the posterior ninth and tenth ribs. Multiple underlying chronic bilateral rib fractures noted. There is new destructive process involving the superior aspect of the sternum (series 6, image 70). Finding suspected to reflect a previous/remote fracture with osseous remodeling. No significant surrounding soft tissue stranding identified. Age indeterminate fractures involving T3 and the right transverse processes of T1 and T2 noted, better evaluated on concomitant CT of the thoracic spine. CT ABDOMEN PELVIS FINDINGS Hepatobiliary: Limited noncontrast evaluation of the liver is grossly unremarkable. Multiple calcified stones noted within the gallbladder lumen. No evidence for acute cholecystitis. No biliary dilatation. Pancreas: Pancreas demonstrates no acute abnormality. Few scattered calcific densities at the pancreatic head and body favored to be vascular in nature. Spleen: Spleen grossly intact and without acute abnormality on this noncontrast examination. Dystrophic calcifications in contour deformity along the lateral margin again noted, stable. Adrenals/Urinary Tract: Adrenal glands within normal limits. Kidneys equal in size. No nephrolithiasis. No hydronephrosis. Mild scattered perinephric stranding noted about the  kidneys bilaterally, nonspecific, but similar to previous. No hydroureter. Bladder moderately distended without acute abnormality. Stomach/Bowel: Stomach within normal limits. No evidence for bowel obstruction. Negative appendix. Large volume retained stool seen within the colon and rectal vault, suggesting constipation. No acute inflammatory changes seen about the bowels. Vascular/Lymphatic: Vascular patency not evaluated given lack of IV contrast. Intra-abdominal aorta of normal  caliber. Mild atherosclerotic change. No adenopathy. Reproductive: Prostate and seminal vesicles within normal limits. Other: No free air or fluid. No mesenteric or retroperitoneal hematoma. Musculoskeletal: No acute fracture within the pelvis. No discrete lytic or blastic osseous lesions. Diffuse anasarca seen within the external soft tissues. Asymmetric soft tissue stranding within the subcutaneous fat of the lateral left flank could reflect mild contusion (series 7, image 188). IMPRESSION: CT CHEST 1. Acute bilateral rib fractures as above. No evidence for hemothorax or pneumothorax. 2. Acute fracture of the left scapular wing. 3. Age indeterminate fractures involving the T3 as well as the T1 and T2 right transverse processes, better evaluated on concomitant CT of the thoracic spine. 4. Moderate to large right greater than left pleural effusions with de pendant opacity, likely atelectasis. Superimposed infection difficult to exclude, and could be considered in the correct clinical setting. Superimposed ground-glass opacity and interlobular septal thickening suggest a degree of pulmonary interstitial edema. 5. Destructive process involving the sternum, new from previous. Finding favored to reflect a remote fracture with associated osseous remodeling. Possible infection or even a destructive lytic mass not entirely excluded. Correlation with physical exam recommended. Additionally, further assessment with dedicated MRI could be performed  as warranted. CT ABDOMEN AND PELVIS 1. Asymmetric soft tissue stranding within the subcutaneous fat of the lateral left flank, which may reflect mild contusion. 2. No other acute traumatic injury within the abdomen and pelvis. 3. Large volume retained stool within the colon, suggesting constipation. 4. Cholelithiasis. 5. Diffuse anasarca, likely related overall volume status. Electronically Signed   By: Jeannine Boga M.D.   On: 10/13/2019 05:20   DG Chest 2 View  Result Date: 10/16/2019 CLINICAL DATA:  Pleural effusion EXAM: CHEST - 2 VIEW COMPARISON:  10/13/2019 FINDINGS: Bilateral pleural effusions layering dependently in the posteroinferior pleural space. Mild compressive atelectasis at the right lung base associated with that. Chronic pleural and parenchymal scarring on the left related old chest trauma. Associated atelectasis related to the effusion. Effusions appear larger than were seen on 10/13/2019. IMPRESSION: Bilateral effusions layering dependently in the posteroinferior pleural spaces. Associated atelectasis of the lower lungs. Chronic pleural and parenchymal scarring on the left related old chest trauma. Effusions may be slightly larger than were seen 3 days ago. Electronically Signed   By: Nelson Chimes M.D.   On: 10/16/2019 09:13   CT HEAD WO CONTRAST  Result Date: 10/13/2019 CLINICAL DATA:  Initial evaluation for acute trauma, fall. EXAM: CT HEAD WITHOUT CONTRAST CT CERVICAL SPINE WITHOUT CONTRAST TECHNIQUE: Multidetector CT imaging of the head and cervical spine was performed following the standard protocol without intravenous contrast. Multiplanar CT image reconstructions of the cervical spine were also generated. COMPARISON:  None available. FINDINGS: CT HEAD FINDINGS Brain: Generalized age-related cerebral atrophy with chronic microvascular ischemic disease. No acute intracranial hemorrhage. No acute large vessel territory infarct. No mass lesion, midline shift or mass effect. No  hydrocephalus. No extra-axial fluid collection. Vascular: No hyperdense vessel. Scattered vascular calcifications noted within the carotid siphons. Skull: Scalp soft tissues demonstrate no acute finding. Calvarium intact. Sinuses/Orbits: Globes and orbital soft tissues within normal limits. Mild mucosal thickening noted within the ethmoidal air cells and maxillary sinuses. Few scattered dental caries noted. No mastoid effusion. Other: None. CT CERVICAL SPINE FINDINGS Alignment: Vertebral bodies normally aligned with preservation of the normal cervical lordosis. No listhesis. Skull base and vertebrae: Skull base intact. Normal C1-2 articulations are preserved in the dens is intact. There is minimal height loss at the superior  endplate of C7, age indeterminate (series 6, image 35). Vertebral body height otherwise maintained. Additional age-indeterminate fractures of the right transverse processes of T1 and T2. Remote fracture of the T1 spinous process noted. No other acute fracture identified. Soft tissues and spinal canal: Paraspinous soft tissues demonstrate no acute finding. No significant prevertebral edema. Vascular calcifications about the carotid bifurcations. Disc levels: Mild multilevel degenerative disc bulging, most notable at C3-4. Left-sided facet arthrosis noted at C2-3. Upper chest: Bilateral pleural effusions noted within the visualized lung apices, better evaluated on concomitant CT of the chest. Other: None. IMPRESSION: CT BRAIN: 1. No acute intracranial abnormality. 2. Generalized age-related cerebral atrophy with mild chronic small vessel ischemic disease. CT CERVICAL SPINE: 1. Mild height loss at the superior endplate of C7, age indeterminate. Correlation with physical exam for possible pain at this location recommended. Additionally, finding could be further assessed with dedicated MRI as clinically warranted. 2. Nondisplaced fractures of the right transverse processes of T1 and T2, also somewhat  age indeterminate. 3. Remote fracture of the T1 spinous process, stable from previous. Electronically Signed   By: Jeannine Boga M.D.   On: 10/13/2019 04:33   CT Chest Wo Contrast  Result Date: 10/13/2019 CLINICAL DATA:  Initial evaluation for acute trauma, fall. EXAM: CT CHEST, ABDOMEN AND PELVIS WITHOUT CONTRAST TECHNIQUE: Multidetector CT imaging of the chest, abdomen and pelvis was performed following the standard protocol without IV contrast. COMPARISON:  Comparison made with prior CT from 04/26/2019. FINDINGS: CT CHEST FINDINGS Cardiovascular: Intrathoracic aorta normal in caliber without aneurysm. Scattered atheromatous change noted about the arch and origin of the great vessels as well as within the descending intrathoracic aorta. Cardiomegaly. Scattered 3 vessel coronary artery calcifications. No pericardial effusion. Mediastinum/Nodes: Coarse calcification noted within the left lobe of thyroid, of doubtful significance. Visualized thyroid otherwise unremarkable. No pathologically enlarged mediastinal or hilar lymph nodes identified on this noncontrast examination. No axillary adenopathy. Esophagus within normal limits. No mediastinal hematoma. Lungs/Pleura: Tracheobronchial tree intact and patent. Lungs hypoinflated with elevation of the right hemidiaphragm. Moderate to large right greater than left pleural effusions. These measure simple fluid density with no evidence for hemothorax. Superimposed ground-glass opacity in interlobular septal thickening elsewhere within the aerated portions of the lungs, suggesting a degree of pulmonary interstitial edema. Superimposed scattered linear and parenchymal opacity within the dependent aspects of both lungs, likely atelectasis. Infection difficult to exclude. No pneumothorax. Musculoskeletal: Mild diffuse anasarca noted within the external soft tissues. There is an acute fracture of the left scapular wing (series 7, image 49). There is an acute fracture  of the left anterolateral third rib, lateral left fifth and sixth ribs. Additional acute appearing fracture of the left posterolateral eleventh rib (series 7, image 164). On the right, there are acute nondisplaced fractures of the right lateral 6 and likely seventh ribs, as well as the posterior ninth and tenth ribs. Multiple underlying chronic bilateral rib fractures noted. There is new destructive process involving the superior aspect of the sternum (series 6, image 70). Finding suspected to reflect a previous/remote fracture with osseous remodeling. No significant surrounding soft tissue stranding identified. Age indeterminate fractures involving T3 and the right transverse processes of T1 and T2 noted, better evaluated on concomitant CT of the thoracic spine. CT ABDOMEN PELVIS FINDINGS Hepatobiliary: Limited noncontrast evaluation of the liver is grossly unremarkable. Multiple calcified stones noted within the gallbladder lumen. No evidence for acute cholecystitis. No biliary dilatation. Pancreas: Pancreas demonstrates no acute abnormality. Few scattered calcific densities at  the pancreatic head and body favored to be vascular in nature. Spleen: Spleen grossly intact and without acute abnormality on this noncontrast examination. Dystrophic calcifications in contour deformity along the lateral margin again noted, stable. Adrenals/Urinary Tract: Adrenal glands within normal limits. Kidneys equal in size. No nephrolithiasis. No hydronephrosis. Mild scattered perinephric stranding noted about the kidneys bilaterally, nonspecific, but similar to previous. No hydroureter. Bladder moderately distended without acute abnormality. Stomach/Bowel: Stomach within normal limits. No evidence for bowel obstruction. Negative appendix. Large volume retained stool seen within the colon and rectal vault, suggesting constipation. No acute inflammatory changes seen about the bowels. Vascular/Lymphatic: Vascular patency not evaluated  given lack of IV contrast. Intra-abdominal aorta of normal caliber. Mild atherosclerotic change. No adenopathy. Reproductive: Prostate and seminal vesicles within normal limits. Other: No free air or fluid. No mesenteric or retroperitoneal hematoma. Musculoskeletal: No acute fracture within the pelvis. No discrete lytic or blastic osseous lesions. Diffuse anasarca seen within the external soft tissues. Asymmetric soft tissue stranding within the subcutaneous fat of the lateral left flank could reflect mild contusion (series 7, image 188). IMPRESSION: CT CHEST 1. Acute bilateral rib fractures as above. No evidence for hemothorax or pneumothorax. 2. Acute fracture of the left scapular wing. 3. Age indeterminate fractures involving the T3 as well as the T1 and T2 right transverse processes, better evaluated on concomitant CT of the thoracic spine. 4. Moderate to large right greater than left pleural effusions with de pendant opacity, likely atelectasis. Superimposed infection difficult to exclude, and could be considered in the correct clinical setting. Superimposed ground-glass opacity and interlobular septal thickening suggest a degree of pulmonary interstitial edema. 5. Destructive process involving the sternum, new from previous. Finding favored to reflect a remote fracture with associated osseous remodeling. Possible infection or even a destructive lytic mass not entirely excluded. Correlation with physical exam recommended. Additionally, further assessment with dedicated MRI could be performed as warranted. CT ABDOMEN AND PELVIS 1. Asymmetric soft tissue stranding within the subcutaneous fat of the lateral left flank, which may reflect mild contusion. 2. No other acute traumatic injury within the abdomen and pelvis. 3. Large volume retained stool within the colon, suggesting constipation. 4. Cholelithiasis. 5. Diffuse anasarca, likely related overall volume status. Electronically Signed   By: Jeannine Boga  M.D.   On: 10/13/2019 05:20   CT CERVICAL SPINE WO CONTRAST  Result Date: 10/13/2019 CLINICAL DATA:  Initial evaluation for acute trauma, fall. EXAM: CT HEAD WITHOUT CONTRAST CT CERVICAL SPINE WITHOUT CONTRAST TECHNIQUE: Multidetector CT imaging of the head and cervical spine was performed following the standard protocol without intravenous contrast. Multiplanar CT image reconstructions of the cervical spine were also generated. COMPARISON:  None available. FINDINGS: CT HEAD FINDINGS Brain: Generalized age-related cerebral atrophy with chronic microvascular ischemic disease. No acute intracranial hemorrhage. No acute large vessel territory infarct. No mass lesion, midline shift or mass effect. No hydrocephalus. No extra-axial fluid collection. Vascular: No hyperdense vessel. Scattered vascular calcifications noted within the carotid siphons. Skull: Scalp soft tissues demonstrate no acute finding. Calvarium intact. Sinuses/Orbits: Globes and orbital soft tissues within normal limits. Mild mucosal thickening noted within the ethmoidal air cells and maxillary sinuses. Few scattered dental caries noted. No mastoid effusion. Other: None. CT CERVICAL SPINE FINDINGS Alignment: Vertebral bodies normally aligned with preservation of the normal cervical lordosis. No listhesis. Skull base and vertebrae: Skull base intact. Normal C1-2 articulations are preserved in the dens is intact. There is minimal height loss at the superior endplate of C7, age  indeterminate (series 6, image 30). Vertebral body height otherwise maintained. Additional age-indeterminate fractures of the right transverse processes of T1 and T2. Remote fracture of the T1 spinous process noted. No other acute fracture identified. Soft tissues and spinal canal: Paraspinous soft tissues demonstrate no acute finding. No significant prevertebral edema. Vascular calcifications about the carotid bifurcations. Disc levels: Mild multilevel degenerative disc bulging,  most notable at C3-4. Left-sided facet arthrosis noted at C2-3. Upper chest: Bilateral pleural effusions noted within the visualized lung apices, better evaluated on concomitant CT of the chest. Other: None. IMPRESSION: CT BRAIN: 1. No acute intracranial abnormality. 2. Generalized age-related cerebral atrophy with mild chronic small vessel ischemic disease. CT CERVICAL SPINE: 1. Mild height loss at the superior endplate of C7, age indeterminate. Correlation with physical exam for possible pain at this location recommended. Additionally, finding could be further assessed with dedicated MRI as clinically warranted. 2. Nondisplaced fractures of the right transverse processes of T1 and T2, also somewhat age indeterminate. 3. Remote fracture of the T1 spinous process, stable from previous. Electronically Signed   By: Jeannine Boga M.D.   On: 10/13/2019 04:33   US RENAL  Result Date: 10/28/2019 CLINICAL DATA:  Acute kidney injury EXAM: RENAL / URINARY TRACT ULTRASOUND COMPLETE COMPARISON:  Ultrasound 10/14/2019 FINDINGS: Right Kidney: Renal measurements: 11.6 x 5.1 x 6 cm = volume: 176.3 mL . Echogenicity within normal limits. No mass or hydronephrosis visualized. Left Kidney: Renal measurements: 10.8 x 6.2 x 5.7 cm = volume: 199 mL. Echogenicity within normal limits. No mass or hydronephrosis visualized. Bladder: Appears normal for degree of bladder distention. Other: Prevoid bladder volume 290 cc. IMPRESSION: Negative renal ultrasound Electronically Signed   By: Donavan Foil M.D.   On: 10/28/2019 15:58   US RENAL  Result Date: 10/14/2019 CLINICAL DATA:  AK I on CKD EXAM: RENAL / URINARY TRACT ULTRASOUND COMPLETE COMPARISON:  CT 10/13/2019 FINDINGS: Right Kidney: Renal measurements: 10.1 x 4.5 x 6.0 cm = volume: 141 mL . Echogenicity within normal limits. No mass or hydronephrosis visualized. Left Kidney: Not assessed. Bladder: Not assessed. Other: None. IMPRESSION: 1. No evidence of obstructive uropathy  of the right kidney. 2. The left kidney and urinary bladder were not assessed. Patient refused to complete exam. Electronically Signed   By: Davina Poke D.O.   On: 10/14/2019 15:16   CT T-SPINE NO CHARGE  Result Date: 10/13/2019 CLINICAL DATA:  Initial evaluation for acute trauma, fall. Back pain. EXAM: CT THORACIC SPINE WITHOUT CONTRAST TECHNIQUE: Multidetector CT images of the thoracic were obtained using the standard protocol without intravenous contrast. COMPARISON:  Prior CT from 04/26/2019. FINDINGS: Alignment: Vertebral bodies normally aligned with preservation of the normal thoracic kyphosis. No listhesis. Vertebrae: Chronic height loss at the superior endplates of T2 and T4 is stable from previous exam. Remote fracture of the T1 spinous process also unchanged. Mild approximate 20% height loss at the superior endplate of T3, new from previous, and could be acute to subacute in nature. No bony retropulsion. Additionally, there are nondisplaced fractures of the right transverse processes of T1, T2, and T3, also new from previous, and could be acute to subacute in nature. Otherwise, vertebral body height maintained with no other acute fracture identified. No discrete osseous lesions. Multiple remotely healed rib fractures noted, stable. Paraspinal and other soft tissues: Paraspinous soft tissues demonstrate no acute finding. Large right greater than left pleural effusions with associated atelectasis and/or consolidation, better evaluated on concomitant chest CT. Disc levels: Mild scattered degenerative  endplate spurring noted within the mid and lower thoracic spine. No appreciable spinal stenosis. IMPRESSION: 1. Mild approximate 20% height loss at the superior endplate of T3, somewhat age indeterminate, but new from previous, and could be acute to subacute in nature. No bony retropulsion. Correlation with physical exam recommended. 2. Nondisplaced fractures of the right transverse processes of T1, T2,  and T3, also new from previous, and could be acute to subacute in nature. 3. Chronic compression deformities at the superior endplates of T2 and T4, stable. 4. Large right greater than left pleural effusions with associated atelectasis and/or consolidation, better evaluated on concomitant chest CT. Electronically Signed   By: Jeannine Boga M.D.   On: 10/13/2019 03:54   CT L-SPINE NO CHARGE  Result Date: 10/13/2019 CLINICAL DATA:  Initial evaluation for acute trauma, fall. EXAM: CT LUMBAR SPINE WITHOUT CONTRAST TECHNIQUE: Multidetector CT imaging of the lumbar spine was performed without intravenous contrast administration. Multiplanar CT image reconstructions were also generated. COMPARISON:  Prior CT from 04/26/2019. FINDINGS: Segmentation: Standard. Lowest well-formed disc space labeled the L5-S1 level. Alignment: Physiologic with preservation of the normal lumbar lordosis. No subluxation or malalignment. Vertebrae: Vertebral body height maintained without evidence for acute or chronic fracture. Visualized sacrum and pelvis intact. SI joints approximated symmetric. No discrete osseous lesions. Paraspinal and other soft tissues: Paraspinous soft tissues within normal limits. Disc levels: L1-2:  Unremarkable. L2-3:  Negative interspace. Mild facet hypertrophy. No stenosis. L3-4: Chronic intervertebral disc space narrowing with diffuse disc bulge. Mild facet hypertrophy. Resultant mild-to-moderate spinal stenosis. Moderate right with mild left foraminal narrowing. L4-5: Mild disc bulge. Bilateral facet hypertrophy. No significant stenosis. L5-S1:  Minimal disc bulge. No stenosis. IMPRESSION: 1. No acute traumatic injury within the lumbar spine. 2. Degenerative disc bulge with facet hypertrophy at L3-4 with resultant mild to moderate spinal stenosis. Electronically Signed   By: Jeannine Boga M.D.   On: 10/13/2019 04:05   DG CHEST PORT 1 VIEW  Result Date: 10/22/2019 CLINICAL DATA:  Cough and  pleural effusion. EXAM: PORTABLE CHEST 1 VIEW COMPARISON:  10/16/2019 FINDINGS: 1114 hours. Asymmetric elevation left hemidiaphragm, stable. Streaky opacity in the left mid lung and left base is similar to prior with substantial overlying deformity of numerous left ribs compatible with old trauma. Right lung is better aerated than on the previous study. Cardiopericardial silhouette is at upper limits of normal for size. IMPRESSION: Interval improvement in right basilar aeration with persistent streaky parenchymal opacity in the left lung and pleuroparenchymal changes likely related to prior left chest wall trauma. No substantial pleural effusion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 AM with labs to be done 1 week prior to that. Contact information: [address] [place] [address] [phone]           Signed: Lavon Paganini [place] 11/03/2019, 5:28 AM

## 2019-10-31 NOTE — Progress Notes (Signed)
Speech Language Pathology Daily Session Note  Patient Details  Name: Barry Lewis MRN: [id number] Date of Birth: 12/06/1960  Today's Date: 10/31/2019 SLP Individual Time: [id number] SLP Individual Time Calculation (min): 55 min  Short Term Goals: Week 2: SLP Short Term Goal 1 (Week 2): STGs=LTGs due to ELOS  Skilled Therapeutic Interventions: Skilled treatment session focused on cognitive goals. SLP facilitated session by providing a handout to maximize scheduling and recall for medication administration with pain medications at discharge. Patient requested to use the bathroom and required Mod-Max A verbal cues for safety with task in regard to donning his darco shoe and utilizing the rollator for safety. Patient requesting assistance finding a car and was able to navigate a newspaper independently to locate appropriate sections/information and demonstrated appropriate problem solving on way he is unable to purchase a car at this time. Patient left upright in bed with alarm on and all needs within reach. Continue with current plan of care.      Pain Pain Assessment Pain Scale: 0-10 Pain Score: 7  Pain Type: Chronic pain Pain Location: Rib cage Pain Orientation: Right Pain Onset: On-going Patients Stated Pain Goal: 3 Pain Intervention(s): Medication (See eMAR)  Therapy/Group: Individual Therapy  Whittaker Lenis 10/31/2019, 10:11 AM

## 2019-11-01 ENCOUNTER — Inpatient Hospital Stay (HOSPITAL_COMMUNITY): Payer: Medicare Other | Admitting: Physical Therapy

## 2019-11-01 ENCOUNTER — Inpatient Hospital Stay (HOSPITAL_COMMUNITY): Payer: Medicare Other

## 2019-11-01 ENCOUNTER — Inpatient Hospital Stay (HOSPITAL_COMMUNITY): Payer: Medicare Other | Admitting: Speech Pathology

## 2019-11-01 LAB — GLUCOSE, CAPILLARY
Glucose-Capillary: 103 mg/dL — ABNORMAL HIGH (ref 70–99)
Glucose-Capillary: 188 mg/dL — ABNORMAL HIGH (ref 70–99)
Glucose-Capillary: 156 mg/dL — ABNORMAL HIGH (ref 70–99)
Glucose-Capillary: 179 mg/dL — ABNORMAL HIGH (ref 70–99)

## 2019-11-01 MED ORDER — ACETAMINOPHEN 325 MG PO TABS: 650.0000 mg | ORAL_TABLET | Freq: Four times a day (QID) | ORAL | Status: AC | PRN

## 2019-11-01 MED ORDER — CARVEDILOL 12.5 MG PO TABS: 12.5000 mg | ORAL_TABLET | Freq: Two times a day (BID) | ORAL | 0 refills | Status: DC

## 2019-11-01 MED ORDER — HYDRALAZINE HCL 100 MG PO TABS: 100.0000 mg | ORAL_TABLET | Freq: Three times a day (TID) | ORAL | 0 refills | Status: DC

## 2019-11-01 MED ORDER — POLYETHYLENE GLYCOL 3350 17 G PO PACK: 17.0000 g | PACK | Freq: Every day | ORAL | 0 refills | Status: AC

## 2019-11-01 MED ORDER — INSULIN GLARGINE 100 UNIT/ML SOLOSTAR PEN: 18.0000 [IU] | PEN_INJECTOR | Freq: Every day | SUBCUTANEOUS | 11 refills | Status: AC

## 2019-11-01 MED ORDER — SODIUM BICARBONATE 650 MG PO TABS: 1300.0000 mg | ORAL_TABLET | Freq: Two times a day (BID) | ORAL | 0 refills | Status: DC

## 2019-11-01 MED ORDER — AMLODIPINE BESYLATE 10 MG PO TABS: 10.0000 mg | ORAL_TABLET | Freq: Every day | ORAL | 0 refills | Status: DC

## 2019-11-01 MED ORDER — CALCIUM ACETATE (PHOS BINDER) 667 MG PO CAPS: 1334.0000 mg | ORAL_CAPSULE | Freq: Three times a day (TID) | ORAL | 0 refills | Status: DC

## 2019-11-01 MED ORDER — VENLAFAXINE HCL ER 75 MG PO CP24: 225.0000 mg | ORAL_CAPSULE | Freq: Every day | ORAL | 0 refills | Status: DC

## 2019-11-01 MED ORDER — OXYCODONE HCL 5 MG PO TABS: 5.0000 mg | ORAL_TABLET | Freq: Four times a day (QID) | ORAL | 0 refills | Status: DC | PRN

## 2019-11-01 MED ORDER — NITROGLYCERIN 0.4 MG SL SUBL: 0.4000 mg | SUBLINGUAL_TABLET | SUBLINGUAL | 12 refills | Status: AC | PRN

## 2019-11-01 MED ORDER — TRAZODONE HCL 100 MG PO TABS: 200.0000 mg | ORAL_TABLET | Freq: Every day | ORAL | 0 refills | Status: DC

## 2019-11-01 MED ORDER — FOLIC ACID 1 MG PO TABS: 1.0000 mg | ORAL_TABLET | Freq: Every day | ORAL | 0 refills | Status: DC

## 2019-11-01 MED ORDER — DOCUSATE SODIUM 100 MG PO CAPS: 100.0000 mg | ORAL_CAPSULE | Freq: Two times a day (BID) | ORAL | 0 refills | Status: DC

## 2019-11-01 MED ORDER — OLANZAPINE 5 MG PO TABS: 5.0000 mg | ORAL_TABLET | Freq: Every day | ORAL | 0 refills | Status: DC

## 2019-11-01 MED ORDER — FERROUS SULFATE 325 (65 FE) MG PO TABS: 325.0000 mg | ORAL_TABLET | Freq: Every day | ORAL | 3 refills | Status: AC

## 2019-11-01 MED ORDER — LIDOCAINE 5 % EX PTCH: 2.0000 | MEDICATED_PATCH | CUTANEOUS | 0 refills | Status: DC

## 2019-11-01 MED ORDER — GABAPENTIN 600 MG PO TABS: 300.0000 mg | ORAL_TABLET | Freq: Every day | ORAL | 0 refills | Status: DC

## 2019-11-01 NOTE — Plan of Care (Signed)
  Problem: Consults Goal: RH GENERAL PATIENT EDUCATION Description: See Patient Education module for education specifics. Outcome: Progressing Goal: Skin Care Protocol Initiated - if Braden Score 18 or less Description: If consults are not indicated, leave blank or document N/A Outcome: Progressing Goal: Diabetes Guidelines if Diabetic/Glucose > 140 Description: If diabetic or lab glucose is > 140 mg/dl - Initiate Diabetes/Hyperglycemia Guidelines & Document Interventions  Outcome: Progressing   Problem: RH SKIN INTEGRITY Goal: RH STG SKIN FREE OF INFECTION/BREAKDOWN Description: Patients skin will remain free from further infection or breakdown with mod I assist. Outcome: Progressing Goal: RH STG MAINTAIN SKIN INTEGRITY WITH ASSISTANCE Description: STG Maintain Skin Integrity With mod I Assistance. Outcome: Progressing Goal: RH STG ABLE TO PERFORM INCISION/WOUND CARE W/ASSISTANCE Description: STG Able To Perform Incision/Wound Care With mod I Assistance. Outcome: Progressing   Problem: RH SAFETY Goal: RH STG ADHERE TO SAFETY PRECAUTIONS W/ASSISTANCE/DEVICE Description: STG Adhere to Safety Precautions With supervision/cues Assistance/Device. Outcome: Progressing

## 2019-11-01 NOTE — Progress Notes (Signed)
Occupational Therapy Discharge Summary  Patient Details  Name: Barry Lewis MRN: [id number] Date of Birth: 01/30/1961  Today's Date: 11/01/2019 OT Individual Time: 1300-1415 OT Individual Time Calculation (min): 75 min    Patient has met 10 of 15 long term goals due to improved activity tolerance, improved balance, postural control, ability to compensate for deficits and improved awareness.  Patient to discharge at overall Modified Independent/supervision level.  Supervision is being recommended at d/c but pt is unable to guarantee this will happen. Pt's daughter lives nearby but is inconsistent in (S). Extensive training and education have been provided to pt to ensure success but he remains unwilling to implement several recommendations.   Reasons goals not met: Pt reports his baseline function includes balance deficits that are much improved after his CIR stay. Pt also remains resistant to several interventions to improve safety and is adamant on "doing things his way" despite recommendations. Pt is still at a (S) level for tub transfers, dynamic standing balance, and IADLs 2/2 need for cueing.   Recommendation:  Patient will benefit from ongoing skilled OT services in home health setting to continue to advance functional skills in the area of BADL and iADL.  Equipment: shower chair  Reasons for discharge: treatment goals met and discharge from hospital  Patient/family agrees with progress made and goals achieved: Yes   Skilled OT Intervention: Pt received supine with c/o rib pain 5/10, requesting shower as intervention. Pt completed bed mobility with mod I. (S) transfer using rollator into bathroom. Pt still requiring min cueing for rollator management before sitting. Pt completed all bathing and dressing  at mod I level. Pt's ulcers re-dressed according to RN instructions. Pt given rest break and cup of water. Pt requesting to go outside and practice more uneven surface functional  mobility. Pt used rollator to complete 350 ft of functional mobility, including navigating elevator and busier hallways with (S) overall. Pt required seated rest break every ~50 ft or so. Cueing provided for rollator management. Discussed d/c throughout session. Pt returned to his room and was left sitting up in the recliner with all needs met, RN present.   OT Discharge Precautions/Restrictions  Precautions Precautions: Fall;Back Required Braces or Orthoses: Other Brace Other Brace: darco shoe on RLE when ambulating Restrictions Weight Bearing Restrictions: No LUE Weight Bearing: Weight bearing as tolerated RLE Weight Bearing: Weight bearing as tolerated Vital Signs Therapy Vitals Temp: 97.9 F (36.6 C) Pulse Rate: 72 BP: (!) 149/91 Patient Position (if appropriate): Lying Oxygen Therapy SpO2: 92 % O2 Device: Room Air Pain Pain Assessment Pain Scale: 0-10 Pain Score: 0-No pain ADL ADL Eating: Independent Where Assessed-Eating: Chair Grooming: Modified independent Where Assessed-Grooming: Standing at sink Upper Body Bathing: Modified independent Where Assessed-Upper Body Bathing: Shower Lower Body Bathing: Modified independent Where Assessed-Lower Body Bathing: Shower Upper Body Dressing: Independent Where Assessed-Upper Body Dressing: Sitting at sink Lower Body Dressing: Modified independent Where Assessed-Lower Body Dressing: Sitting at sink, Standing at sink Toileting: Modified independent Where Assessed-Toileting: Toilet Toilet Transfer: Modified independent Toilet Transfer Method: Ambulating Tub/Shower Transfer: Distant supervision Tub/Shower Transfer Method: Ambulating Tub/Shower Equipment: Shower seat with back Vision Baseline Vision/History: No visual deficits Patient Visual Report: Blurring of vision Vision Assessment?: No apparent visual deficits Perception  Perception: Within Functional Limits Praxis Praxis: Intact Cognition Overall Cognitive Status:  History of cognitive impairments - at baseline Arousal/Alertness: Awake/alert Orientation Level: Oriented X4 Attention: Sustained Sustained Attention: Appears intact Memory: Impaired Memory Impairment: Decreased short term memory;Decreased recall of new information Decreased   Short Term Memory: Verbal complex;Functional complex Awareness: Impaired Awareness Impairment: Emergent impairment Problem Solving: Impaired Problem Solving Impairment: Verbal complex;Functional complex Decision Making: Impaired Decision Making Impairment: Functional complex Behaviors: Impulsive Safety/Judgment: Impaired Sensation Sensation Light Touch: Impaired by gross assessment Hot/Cold: Appears Intact Proprioception: Appears Intact Coordination Gross Motor Movements are Fluid and Coordinated: No Fine Motor Movements are Fluid and Coordinated: Yes Coordination and Movement Description: Pt limited by diabetic ulcers on his RLE and requires a darco boot for ambulation, impacting balance, as well as balance deficits present at baseline Finger Nose Finger Test: WFL Motor  Motor Motor: Within Functional Limits Motor - Discharge Observations: Generalized weakness/balance deficits Mobility  Bed Mobility Bed Mobility: Rolling Right;Rolling Left;Sit to Sidelying Left;Left Sidelying to Sit Rolling Right: Independent Rolling Left: Independent Left Sidelying to Sit: Independent Sit to Sidelying Left: Independent Transfers Sit to Stand: Independent with assistive device Stand to Sit: Independent with assistive device  Trunk/Postural Assessment  Cervical Assessment Cervical Assessment: Within Functional Limits Thoracic Assessment Thoracic Assessment: Exceptions to WFL(back precautions) Lumbar Assessment Lumbar Assessment: Within Functional Limits Postural Control Postural Control: Deficits on evaluation Protective Responses: delayed balance reactions  Balance Balance Balance Assessed: Yes Dynamic  Sitting Balance Dynamic Sitting - Balance Support: Feet supported Dynamic Sitting - Level of Assistance: 6: Modified independent (Device/Increase time) Static Standing Balance Static Standing - Balance Support: Bilateral upper extremity supported Static Standing - Level of Assistance: 6: Modified independent (Device/Increase time) Dynamic Standing Balance Dynamic Standing - Balance Support: Bilateral upper extremity supported Dynamic Standing - Level of Assistance: 5: Stand by assistance Extremity/Trunk Assessment RUE Assessment RUE Assessment: Exceptions to WFL General Strength Comments: generalized weakness LUE Assessment LUE Assessment: Exceptions to WFL General Strength Comments: generalized weakness    H  OTR/L  11/01/2019, 7:34 AM 

## 2019-11-01 NOTE — Progress Notes (Signed)
Physical Therapy Discharge Summary  Patient Details  Name: Barry Lewis MRN: [id number] Date of Birth: February 27, 1961  Today's Date: 11/01/2019 PT Individual Time: 1130-1153 PT Individual Time Calculation (min): 23 min   Pt received ambulating in hallway w/ NT, took over supervision of pt. Pt agreeable to therapy and no c/o pain. Pt ambulated to/from therapy gym w/ 1 seated rest break, w/ supervision. Practiced gait over uneven surfaces w/ supervision using rollator and furniture transfer to ADL apartment couch - mod/i. Practiced car transfer w/ supervision and verbal cues for safety/technique as well. Upon returning to room, pt left rollator, did not lock brakes, and sat on bed 2-3 ft away. When therapist called pt's attention to this safety issue, pt stated "oh I forgot!". Otherwise, pt performed all rollator brakes management safely throughout session w/o cues. Pt ended session sitting EOB independently, all needs in reach.   Patient has met 3 of 8 long term goals due to improved activity tolerance, improved balance, improved postural control, increased strength, improved attention, improved awareness and improved coordination.  Patient to discharge at an ambulatory level , mod/i sit<>stands and stand pivot transfers. Patient's care partner unavailable to provide the necessary physical and cognitive assistance at discharge. Pt does have a daughter in the area, however she is rarely available to check up on him. He admits that he was very unsafe prior to admission and feels his functional level and balance is far better than what it was prior to admission. However, pt continues to require cues for safety awareness and supervision w/ gait and stair negotiation. He is aware of cues but often chooses to ignore recommendations that would make his mobility safer. Therapy team would recommend 24/7 supervision but pt states he has no option other than to be home alone at this time. Therapy team has also performed  extensive fall risk education including home set-up modifications, floor transfer practice, and education w/ using rollator to make IADLs safer.   Reasons goals not met: Pt continues to require supervision for gait and stair negotiation 2/2 poor safety awareness, decreased memory of education provided to him for safety, and balance deficits that likely have been present for many months 2/2 significant history of falls at home prior to admission.   Recommendation:  Patient will benefit from ongoing skilled PT services in home health setting to continue to advance safe functional mobility, address ongoing impairments in functional balance, BLE strength, endurance, and safety awareness and minimize fall risk.  Equipment: rollator  Reasons for discharge: discharge from hospital  Patient/family agrees with progress made and goals achieved: Yes  PT Discharge Precautions/Restrictions Precautions Precautions: Fall;Back Required Braces or Orthoses: Other Brace Other Brace: darco shoe on RLE when WB Restrictions Weight Bearing Restrictions: No LUE Weight Bearing: Weight bearing as tolerated RLE Weight Bearing: Weight bearing as tolerated Vital Signs Therapy Vitals Temp: 97.9 F (36.6 C) Pulse Rate: 82 BP: (!) 165/82 Patient Position (if appropriate): Lying Oxygen Therapy SpO2: 92 % O2 Device: Room Air Pain Pain Assessment Pain Scale: 0-10 Pain Score: 6  Pain Type: Acute pain Pain Location: Rib cage Pain Orientation: Right Pain Descriptors / Indicators: Sharp Pain Frequency: Intermittent Pain Onset: On-going Patients Stated Pain Goal: 3 Pain Intervention(s): Rest Vision/Perception  Perception Perception: Within Functional Limits Praxis Praxis: Intact  Cognition Overall Cognitive Status: History of cognitive impairments - at baseline Arousal/Alertness: Awake/alert Orientation Level: Oriented X4 Attention: Sustained Sustained Attention: Appears intact Memory:  Impaired Memory Impairment: Decreased short term memory;Decreased recall of new information  Decreased Short Term Memory: Verbal complex;Functional complex Awareness: Impaired Awareness Impairment: Emergent impairment Problem Solving: Impaired Problem Solving Impairment: Verbal complex;Functional complex Decision Making: Impaired Decision Making Impairment: Functional complex Behaviors: Impulsive Safety/Judgment: Impaired Sensation Sensation Light Touch: Impaired by gross assessment Hot/Cold: Appears Intact Proprioception: Appears Intact Coordination Gross Motor Movements are Fluid and Coordinated: No Fine Motor Movements are Fluid and Coordinated: Yes Coordination and Movement Description: Pt limited by diabetic ulcers on his RLE and requires a darco boot for ambulation, impacting balance, as well as balance deficits present at baseline Finger Nose Finger Test: [facility] Motor  Motor Motor: Motor perseverations Motor - Discharge Observations: Generalized weakness/balance deficits, global large-amplitude tremors in standing, pt states that is baseline  Mobility Bed Mobility Bed Mobility: Rolling Right;Rolling Left;Supine to Sit;Sit to Supine Rolling Right: Independent Rolling Left: Independent Left Sidelying to Sit: Independent Supine to Sit: Independent Sit to Supine: Independent Sit to Sidelying Left: Independent Transfers Transfers: Sit to Stand;Stand to Sit;Stand Pivot Transfers Sit to Stand: Independent with assistive device Stand to Sit: Independent with assistive device Stand Pivot Transfers: Independent with assistive device Stand Pivot Transfer Details: Verbal cues for gait pattern;Verbal cues for safe use of DME/AE;Verbal cues for sequencing Transfer (Assistive device): Rollator Locomotion  Gait Ambulation: Yes Gait Assistance: Supervision/Verbal cueing Gait Distance (Feet): 150 Feet Assistive device: Rollator Gait Assistance Details: Verbal cues for  precautions/safety;Verbal cues for safe use of DME/AE Gait Gait: Yes Gait Pattern: Impaired Gait Pattern: Wide base of support;Trunk flexed Gait velocity: decreased Stairs / Additional Locomotion Stairs: Yes Stairs Assistance: Supervision/Verbal cueing Stair Management Technique: One rail Left Number of Stairs: 4 Height of Stairs: 6 Wheelchair Mobility Wheelchair Mobility: No  Trunk/Postural Assessment  Cervical Assessment Cervical Assessment: Within Functional Limits Thoracic Assessment Thoracic Assessment: Within Functional Limits Lumbar Assessment Lumbar Assessment: Within Functional Limits Postural Control Postural Control: Deficits on evaluation Protective Responses: delayed balance reactions  Balance Balance Balance Assessed: Yes Dynamic Sitting Balance Dynamic Sitting - Balance Support: Feet supported Dynamic Sitting - Level of Assistance: 6: Modified independent (Device/Increase time) Static Standing Balance Static Standing - Balance Support: Bilateral upper extremity supported Static Standing - Level of Assistance: 6: Modified independent (Device/Increase time) Dynamic Standing Balance Dynamic Standing - Balance Support: Bilateral upper extremity supported Dynamic Standing - Level of Assistance: 5: Stand by assistance Extremity Assessment  RLE Assessment RLE Assessment: Exceptions to [facility] General Strength Comments: Globally 4/5 LLE Assessment LLE Assessment: Exceptions to [facility] General Strength Comments: Globally 4/5    Esker Dever K Orah Sonnen 11/01/2019, 8:08 AM

## 2019-11-01 NOTE — Progress Notes (Signed)
Speech Language Pathology Discharge Summary  Patient Details  Name: Barry Lewis MRN: [id number] Date of Birth: March 06, 1961  Today's Date: 11/01/2019 SLP Individual Time: 0925-1020 SLP Individual Time Calculation (min): 55 min   Skilled Therapeutic Interventions:  Skilled treatment session focused on cognitive goals. SLP facilitated session by administering the [place]. Patient scored 28/30 points with difficulty only noted in short-term recall. However,  patient reports baseline impairments in recall due to "drinking too much." SLP also educated patient on memory strategies to utilize at home to maximize recall and safety. Patient verbalized understanding and handouts were given to reinforce information. Patient left supine in bed with alarm on and all needs within reach.   Patient has met 4 of 4 long term goals.  Patient to discharge at overall Min;Mod level.   Reasons goals not met: N/A   Clinical Impression/Discharge Summary: Patient has made slow but functional gains and has met 4 of 4 LTGs this admission. Patient has a history of cognitive impairments at baseline and continues to require overall Mod A verbal cues for recall of new information with use of compensatory strategies and emergent awareness, especially in regard to safety. Patient also requires overall supervision-Min A verbal cues for sustained attention to tasks and functional problem solving. Patient education is complete and patient will discharge home with intermittent assistance from family/friends despite recommendation for 24 hour supervision to ensure safety. Although patient will not have 24 hour supervision at home, suspect patient is close to his baseline level of cognitive functioning prior to hospitalization and has additional support in place to maximize safety. Patient would benefit from f/u SLP services to maximize his cognitive functioning and overall functional independence.   Care Partner:  Caregiver Able to  Provide Assistance: No  Type of Caregiver Assistance: Physical;Cognitive  Recommendation:  Home Health SLP;24 hour supervision/assistance  Rationale for SLP Follow Up: Maximize cognitive function and independence;Reduce caregiver burden   Equipment: N/A   Reasons for discharge: Discharged from hospital;Treatment goals met   Patient/Family Agrees with Progress Made and Goals Achieved: Yes    Lehman Whiteley 11/01/2019, 6:41 AM

## 2019-11-01 NOTE — Progress Notes (Signed)
Patient ID: Barry Lewis, male   DOB: 07/01/1961, 59 y.o.   MRN: [id number] S: He feels well and thinks he is getting better O:BP (!) 165/82   Pulse 82   Temp 97.9 F (36.6 C)   Resp 18   Ht 5\' 6"  (1.676 m)   Wt 82.6 kg   SpO2 92%   BMI 29.39 kg/m   Intake/Output Summary (Last 24 hours) at 11/01/2019 1335 Last data filed at 11/01/2019 1130 Gross per 24 hour  Intake 1322 ml  Output 2150 ml  Net -828 ml   Intake/Output: I/O last 3 completed shifts: In: 2399 [P.O.:1925; NG/GT:474] Out: 4825 [Urine:4825]  Intake/Output this shift:  Total I/O In: 477 [P.O.:240; NG/GT:237] Out: 325 [Urine:325] Weight change: -0.3 kg Gen: WD WN WM in NAD CVS: no rub Resp: cta Abd: benign Ext: no edema  Recent Labs  Lab 10/26/19 0613 10/27/19 0829 10/28/19 1020 10/29/19 0521 10/30/19 0716 10/31/19 0556  NA 136 135 136 137 137 135  K 4.9 5.2* 5.3* 4.8 4.8 5.1  CL 97* 99 102 105 104 103  CO2 22 22 22  20* 20* 20*  GLUCOSE 124* 173* 148* 101* 123* 125*  BUN 95* 98* 104* 95* 95* 99*  CREATININE 5.57* 5.82* 6.33* 5.91* 5.96* 5.72*  ALBUMIN 2.2* 2.1* 2.3* 2.2* 2.2* 2.4*  CALCIUM 8.8* 8.6* 8.7* 8.8* 8.8* 9.1  PHOS 6.8* 6.5* 5.9* 5.7* 5.8* 5.8*   Liver Function Tests: Recent Labs  Lab 10/29/19 0521 10/30/19 0716 10/31/19 0556  ALBUMIN 2.2* 2.2* 2.4*   No results for input(s): LIPASE, AMYLASE in the last 168 hours. No results for input(s): AMMONIA in the last 168 hours. CBC: No results for input(s): WBC, NEUTROABS, HGB, HCT, MCV, PLT in the last 168 hours. Cardiac Enzymes: No results for input(s): CKTOTAL, CKMB, CKMBINDEX, TROPONINI in the last 168 hours. CBG: Recent Labs  Lab 10/31/19 1159 10/31/19 1656 10/31/19 2053 11/01/19 0534 11/01/19 1231  GLUCAP 143* 239* 152* 103* 188*    Iron Studies: No results for input(s): IRON, TIBC, TRANSFERRIN, FERRITIN in the last 72 hours. Studies/Results: No results found. [name] amLODipine  10 mg Oral Daily  . calcium acetate  1,334 mg Oral  TID WC  . carvedilol  12.5 mg Oral BID WC  . docusate sodium  100 mg Oral BID  . feeding supplement (NEPRO CARB STEADY)  237 mL Oral BID BM  . ferrous sulfate  325 mg Oral Q breakfast  . fluticasone  1 spray Each Nare Daily  . folic acid  1 mg Oral Daily  . gabapentin  300 mg Oral QHS  . heparin  5,000 Units Subcutaneous Q8H  . hydrALAZINE  100 mg Oral Q8H  . insulin aspart  0-15 Units Subcutaneous TID WC  . insulin glargine  18 Units Subcutaneous Daily  . lidocaine  2 patch Transdermal Q24H  . OLANZapine  5 mg Oral QHS  . polyethylene glycol  17 g Oral Daily  . sodium bicarbonate  1,300 mg Oral BID  . thiamine  100 mg Oral Daily  . traZODone  200 mg Oral QHS  . venlafaxine XR  225 mg Oral Q breakfast    BMET    Component Value Date/Time   NA 135 10/31/2019 0556   K 5.1 10/31/2019 0556   CL 103 10/31/2019 0556   CO2 20 (L) 10/31/2019 0556   GLUCOSE 125 (H) 10/31/2019 0556   BUN 99 (H) 10/31/2019 0556   CREATININE 5.72 (H) 10/31/2019 0556   CALCIUM 9.1  10/31/2019 0556   CALCIUM 7.8 (L) 10/21/2019 1211   GFRNONAA 10 (L) 10/31/2019 0556   GFRAA 12 (L) 10/31/2019 0556   CBC    Component Value Date/Time   WBC 5.4 10/19/2019 0521   RBC 3.18 (L) 10/19/2019 0521   HGB 10.2 (L) 10/19/2019 0521   HCT 31.3 (L) 10/19/2019 0521   PLT 260 10/19/2019 0521   MCV 98.4 10/19/2019 0521   MCH 32.1 10/19/2019 0521   MCHC 32.6 10/19/2019 0521   RDW 13.4 10/19/2019 0521   LYMPHSABS 0.8 10/19/2019 0521   MONOABS 0.6 10/19/2019 0521   EOSABS 0.3 10/19/2019 0521   BASOSABS 0.0 10/19/2019 0521    Assessment/Plan:  1. AKI/CKD stage 4- has history of AKI/CKD back in September 2020 when he presented with fall and multiple fractures, and required transient HD at that time.  Cr peaked at 7.l24 but improved off of HD to 2.87 on 05/17/19.  Cr peaked at 6.33 this admission and is slowly improving.   1. Recheck renal panel in am.  2. No indication for dialysis and will need to follow up closely  with his Nephrologist in [place] area. 2. HTN- poorly controlled.  Increase coreg to 12.5 mg bid and follow HR and bp. 3. Hyperkalemia- resolved but may need to start po lasix to control K. 4. Anemia of CKD- stable 5. IDDM- per primary  Donetta Potts, MD [facility] [phone]

## 2019-11-01 NOTE — Discharge Instructions (Signed)
Inpatient Rehab Discharge Instructions  [facility] Discharge date and time: No discharge date for patient encounter.   Activities/Precautions/ Functional Status: Activity: activity as tolerated Diet: diabetic diet with 1200 mL fluid restriction Wound Care: keep wound clean and dry Functional status:  ___ No restrictions     ___ Walk up steps independently ___ 24/7 supervision/assistance   ___ Walk up steps with assistance ___ Intermittent supervision/assistance  ___ Bathe/dress independently ___ Walk with walker     _x__ Bathe/dress with assistance ___ Walk Independently    ___ Shower independently ___ Walk with assistance    ___ Shower with assistance ___ No alcohol     ___ Return to work/school ________     COMMUNITY REFERRALS UPON DISCHARGE:    Home Health:   PT     OT     ST    SW                    Agency:  [facility]    Phone:  [phone]    Medical Equipment/Items Ordered:  rollator walker, tub seat                                                      Agency/Supplier:  [employer] @ [phone]   Transportation Assistance (in [place]. Only) - [employer] @ [phone]      Special Instructions: No driving smoking or alcohol  Follow-up Dr. Sharol Given orthopedic services for debridement and ongoing care of chronic foot wounds  Follow-up renal services in regards to renal insufficiency   My questions have been answered and I understand these instructions. I will adhere to these goals and the provided educational materials after my discharge from the hospital.  Patient/Caregiver Signature _______________________________ Date __________  Clinician Signature _______________________________________ Date __________  Please bring this form and your medication list with you to all your follow-up doctor's appointments.

## 2019-11-01 NOTE — Progress Notes (Signed)
[facility] PHYSICAL MEDICINE & REHABILITATION PROGRESS NOTE   Subjective/Complaints: [name] is very excited to be going home tomorrow.  He is currently feeling very cold and is wearing his jacket in his bed with blankets on him.  He asks whether there is any transportation assistance he can obtain to attend his outpatient appointments.   ROS: Patient denies fever, rash, sore throat, blurred vision, nausea, vomiting, diarrhea, cough, shortness of breath or chest pain,   headache, or mood change.     Objective:   No results found. No results for input(s): WBC, HGB, HCT, PLT in the last 72 hours. Recent Labs    10/30/19 0716 10/31/19 0556  NA 137 135  K 4.8 5.1  CL 104 103  CO2 20* 20*  GLUCOSE 123* 125*  BUN 95* 99*  CREATININE 5.96* 5.72*  CALCIUM 8.8* 9.1    Intake/Output Summary (Last 24 hours) at 11/01/2019 1022 Last data filed at 11/01/2019 0800 Gross per 24 hour  Intake 1682 ml  Output 2125 ml  Net -443 ml     Physical Exam: Vital Signs Blood pressure (!) 165/82, pulse 82, temperature 97.9 F (36.6 C), resp. rate 18, height 5\' 6"  (1.676 m), weight 82.6 kg, SpO2 92 %. Constitutional: No distress . Vital signs reviewed. Lying in bed with blankets wrapped around him, wearing jacket, feeling cold.  HEENT: EOMI, oral membranes moist Neck: supple Cardiovascular: RRR without murmur. No JVD    Respiratory: CTA Bilaterally without wheezes or rales. Normal effort    GI: BS +, non-tender, non-distended  Musculoskeletal:     Comments: right chest wall tenderness with palpation and deep breath Neurological: He is alert.  Oriented to place person. Fair insight. STM deficits present.  Motor: Bilateral upper extremities: 4+/5 proximal distal right lower extremity: Hip flexion, knee extension 4/5, ankle dorsiflexion 4+/5 Left lower extremity: Hip flexion, knee extension 4 -/5, ankle dorsiflexion 4+/5. Decreased LT in both feet.  Skin:  Eschar/wound right 1st MT--somewhat  decreased but without substantial change Psychiatric:pleasant and in good spirits.     Assessment/Plan: 1. Functional deficits secondary to encephalopathy, polytrauma which require 3+ hours per day of interdisciplinary therapy in a comprehensive inpatient rehab setting.  Physiatrist is providing close team supervision and 24 hour management of active medical problems listed below.  Physiatrist and rehab team continue to assess barriers to discharge/monitor patient progress toward functional and medical goals  Care Tool:  Bathing    Body parts bathed by patient: Right arm, Left arm, Chest, Abdomen, Front perineal area, Right upper leg, Left upper leg, Right lower leg, Left lower leg, Face, Buttocks   Body parts bathed by helper: Buttocks     Bathing assist Assist Level: Supervision/Verbal cueing     Upper Body Dressing/Undressing Upper body dressing   What is the patient wearing?: Pull over shirt    Upper body assist Assist Level: Independent    Lower Body Dressing/Undressing Lower body dressing      What is the patient wearing?: Pants     Lower body assist Assist for lower body dressing: Supervision/Verbal cueing     Toileting Toileting    Toileting assist Assist for toileting: Supervision/Verbal cueing Assistive Device Comment: urinal   Transfers Chair/bed transfer  Transfers assist     Chair/bed transfer assist level: Independent with assistive device Chair/bed transfer assistive device: Other(rollator)   Locomotion Ambulation   Ambulation assist   Ambulation activity did not occur: Safety/medical concerns(used cane PTA, unsafe to attempt)  Assist level:  Supervision/Verbal cueing Assistive device: Rollator Max distance: 150'   Walk 10 feet activity   Assist  Walk 10 feet activity did not occur: Safety/medical concerns(unsafe to attempt w/cane)  Assist level: Supervision/Verbal cueing Assistive device: Rollator   Walk 50 feet  activity   Assist Walk 50 feet with 2 turns activity did not occur: Safety/medical concerns  Assist level: Supervision/Verbal cueing Assistive device: Rollator    Walk 150 feet activity   Assist Walk 150 feet activity did not occur: Safety/medical concerns  Assist level: Supervision/Verbal cueing Assistive device: Rollator    Walk 10 feet on uneven surface  activity   Assist Walk 10 feet on uneven surfaces activity did not occur: Safety/medical concerns   Assist level: Supervision/Verbal cueing Assistive device: Rollator   Wheelchair     Assist Will patient use wheelchair at discharge?: No   Wheelchair activity did not occur: N/A         Wheelchair 50 feet with 2 turns activity    Assist    Wheelchair 50 feet with 2 turns activity did not occur: N/A       Wheelchair 150 feet activity     Assist  Wheelchair 150 feet activity did not occur: N/A       Blood pressure (!) 165/82, pulse 82, temperature 97.9 F (36.6 C), resp. rate 18, height 5\' 6"  (1.676 m), weight 82.6 kg, SpO2 92 %.  Medical Problem List and Plan: 1.  Debility/encephalopathy secondary to polytrauma.             -patient may not shower             -ELOS 2/18 confirmed in team conf 2/16  -Continue CIR therapies including PT and OT and SLP  -  darco shoe for right foot to off load 1st MT head while standing   -mod I goals given lack of supervision at home.  2.  Antithrombotics: -DVT/anticoagulation:  SQ Heparin             -antiplatelet therapy: N/A 3. Pain Management/Chronic back pain: Neurontin 300 mg nightly, trazodone 200 mg nightly oxycodone as needed   2/10: Added kpad for lower back and lidocaine patch for right rib pain  2/11 will increase lidocaine patches to 2 daily. Not anxious to increase oxycodone further given cognition, total dose, etc  2/16 decreasing oxycodone to 5mg  q6 prn---need to further wean to reduce risk at home. Would do better perhaps with scheduled med  as well.  4. Mood:/Dementia With history of bipolar disorder.  Effexor 225 mg daily             -antipsychotic agents: Zyprexa 5 mg nightly  -anxiety likely a factor in pain.  -2/17: well controlled. Has used one oxycodone this morning.  5. Neuropsych: This patient is capable of making decisions on his own behalf.  2/10: Telesitter may be d/ced 6. Skin/Wound Care:  -will dc santyl as it's not really having much affect. Can keep toe protected with kerlix wrap.  7. Fluids/Electrolytes/Nutrition: Routine in and outs.  CMP ordered. 8.  Acute on chronic renal failure stage IV.  Baseline creatinine 2.2-2.7.  Follow-up renal services.   Lasix 40mg  daily being held d/t incr Cr  2/15- Cr stable after holding lasix. 5.96  -nephro is following  -renal u/s negative 9.  Rhabdomyolysis.  CK trending down. 10.  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  Hemoglobin A1c 8.5.  Lantus insulin 15 units daily.  Check blood sugars before meals and  at bedtime.  Diabetic teaching             2/5 sugars remain elevated.   2/9 increased lantus to 18u daily.   -cover sith SSI  2/12 fair control---follow for further pattern    CBG (last 3)  Recent Labs    10/31/19 1656 10/31/19 2053 11/01/19 0534  GLUCAP 239* 152* 103*    Controlled 2/16, 2/17 11. Hypertension.Norvasc 10 mg daily,Hydralazine 25 mg every 8 hours             2/9 some increase today. Follow for more consistent pattern   -no changes at present  2/12 intermittent elevation often d/t pain/anxiety  2/17: elevated this morning. Flowsheet personally reviewed. Has been quite elevated last several reads. Reviewed nephrology note; coreg was increased to 12.5mg  BID yesterday, Continue to monitor.  12.  Combined systolic and diastolic congestive heart failure.  Monitor for any signs of fluid overload.  Continue diuresis          Filed Weights   10/30/19 0454 10/31/19 0431 11/01/19 0447  Weight: 86.5 kg 82.9 kg 82.6 kg   weights  stable 2/16 13.  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  Continue Prevalon boot.  Follow-up outpatient Dr. Sharol Given to plan for debridement.  No surgical intervention necessary at this time  -see above #6 14.  Left pleural effusion with history of COPD.  Continue diuresis.  May need thoracentesis.  Follow-up chest x-ray as needed  IS, OOB 15.  Destructive process involving sternum suspect old fracture versus infection.  Latest blood cultures no growth to date.  Radiologist reviewed likely chronic smoldering process.  Follow-up outpatient CVTS.  -rx pain as needed, appears controlled  2/8 likely cause of w/e chest pain.       Appreciate cardiology follow up 16.  T3 as well as T1 and T2 right transverse process fractures.  Conservative care per Dr. Ashok Pall 17.  Right scapular fracture.  Weightbearing as tolerated 18.  Bilateral rib fractures.  Conservative care. 19.  History of polysubstance abuse tobacco and alcohol abuse.  Urine drug screen negative.  Provide counseling 20.  Medical noncompliance.  Provide counseling 21. Poor family support: Patient does not have family or friends who can drive him to appointments. Discussed with SW Lorre Nick and she will discuss with [facility] regarding what transportation services he may qualify for. Unfortunately that will not bring him to [place]. I will discuss with outpatient team to schedule his transitional care appointment with [place] via [employer] due to his lack of transportation.      LOS: 14 days A FACE TO FACE EVALUATION WAS PERFORMED  Gardy Montanari P Chee Dimon 11/01/2019, 10:22 AM

## 2019-11-01 NOTE — Progress Notes (Signed)
Physical Therapy Session Note  Patient Details  Name: Barry Lewis MRN: [id number] Date of Birth: 02-Jun-1961  Today's Date: 11/01/2019 PT Individual Time: 0800-0825 PT Individual Time Calculation (min): 25 min   Short Term Goals: Week 1:  PT Short Term Goal 1 (Week 1): Pt will demonstrate wc to/from bed/mat w/supervision and safe use of LRAD PT Short Term Goal 1 - Progress (Week 1): Progressing toward goal PT Short Term Goal 2 (Week 1): Gait 267f w/LRAD and cga PT Short Term Goal 2 - Progress (Week 1): Met PT Short Term Goal 3 (Week 1): ascend/descent 3 stairs w/single rail and cga assist PT Short Term Goal 3 - Progress (Week 1): Progressing toward goal Week 2:  PT Short Term Goal 1 (Week 2): =LTGs due to ELOS  Skilled Therapeutic Interventions/Progress Updates:   Received pt supine in bed, pt agreeable to therapy, and denied any pain at beginning of session. Session focused on discharge planning, functional mobility/transfers, toileting, ambulation, floor transfers, dynamic standing balance/coordination, and improved endurance with activity. Pt performed bed mobility independently with increased time. Therapist donned R darco shoe with max A for time management purposes. Pt ambulated 155fwith rollator to bathroom with supervision and able to void while standing with supervision. Pt able to perform LE clothing management and hygiene with supervision. Pt ambulated 15090f 2 trials to/from therapy gym with 1 seated rest break in between ([id number] performed floor transfer x 2 trials with close supervision without verbal cues. Pt required multiple rest breaks throughout session due to increased fatigue. Concluded session with pt supine in bed, needs within reach, and bed alarm on.   Therapy Documentation Precautions:  Precautions Precautions: Fall, Back Precaution Booklet Issued: No Precaution Comments: Reviewed log roll for comfort due to fracture Required Braces or Orthoses: Other  Brace Other Brace: darco shoe on RLE when ambulating Restrictions Weight Bearing Restrictions: No LUE Weight Bearing: Weight bearing as tolerated RLE Weight Bearing: Weight bearing as tolerated Other Position/Activity Restrictions: Prevalon Boot for wbing   Therapy/Group: Individual Therapy AnnaAlfonse Alpers DPT   11/01/2019, 7:47 AM

## 2019-11-02 LAB — GLUCOSE, CAPILLARY
Glucose-Capillary: 113 mg/dL — ABNORMAL HIGH (ref 70–99)
Glucose-Capillary: 245 mg/dL — ABNORMAL HIGH (ref 70–99)
Glucose-Capillary: 177 mg/dL — ABNORMAL HIGH (ref 70–99)
Glucose-Capillary: 97 mg/dL (ref 70–99)

## 2019-11-02 LAB — RENAL FUNCTION PANEL
Albumin: 2.3 g/dL — ABNORMAL LOW (ref 3.5–5.0)
Anion gap: 12 (ref 5–15)
BUN: 105 mg/dL — ABNORMAL HIGH (ref 6–20)
CO2: 21 mmol/L — ABNORMAL LOW (ref 22–32)
Calcium: 8.5 mg/dL — ABNORMAL LOW (ref 8.9–10.3)
Chloride: 104 mmol/L (ref 98–111)
Creatinine, Ser: 6.1 mg/dL — ABNORMAL HIGH (ref 0.61–1.24)
GFR calc Af Amer: 11 mL/min — ABNORMAL LOW (ref >60)
GFR calc non Af Amer: 9 mL/min — ABNORMAL LOW (ref >60)
Glucose, Bld: 109 mg/dL — ABNORMAL HIGH (ref 70–99)
Phosphorus: 5.8 mg/dL — ABNORMAL HIGH (ref 2.5–4.6)
Potassium: 5 mmol/L (ref 3.5–5.1)
Sodium: 137 mmol/L (ref 135–145)

## 2019-11-02 LAB — CBC
HCT: 29.3 % — ABNORMAL LOW (ref 39.0–52.0)
Hemoglobin: 9.8 g/dL — ABNORMAL LOW (ref 13.0–17.0)
MCH: 32.6 pg (ref 26.0–34.0)
MCHC: 33.4 g/dL (ref 30.0–36.0)
MCV: 97.3 fL (ref 80.0–100.0)
Platelets: 255 10*3/uL (ref 150–400)
RBC: 3.01 MIL/uL — ABNORMAL LOW (ref 4.22–5.81)
RDW: 12.8 % (ref 11.5–15.5)
WBC: 4.6 10*3/uL (ref 4.0–10.5)
nRBC: 0 % (ref 0.0–0.2)

## 2019-11-02 NOTE — Plan of Care (Signed)
  Problem: Consults Goal: RH GENERAL PATIENT EDUCATION Description: See Patient Education module for education specifics. Outcome: Completed/Met Goal: Skin Care Protocol Initiated - if Braden Score 18 or less Description: If consults are not indicated, leave blank or document N/A Outcome: Completed/Met Goal: Diabetes Guidelines if Diabetic/Glucose > 140 Description: If diabetic or lab glucose is > 140 mg/dl - Initiate Diabetes/Hyperglycemia Guidelines & Document Interventions  Outcome: Completed/Met   Problem: RH SKIN INTEGRITY Goal: RH STG SKIN FREE OF INFECTION/BREAKDOWN Description: Patients skin will remain free from further infection or breakdown with mod I assist. Outcome: Completed/Met Goal: RH STG MAINTAIN SKIN INTEGRITY WITH ASSISTANCE Description: STG Maintain Skin Integrity With mod I Assistance. Outcome: Completed/Met Goal: RH STG ABLE TO PERFORM INCISION/WOUND CARE W/ASSISTANCE Description: STG Able To Perform Incision/Wound Care With mod I Assistance. Outcome: Completed/Met   Problem: RH SAFETY Goal: RH STG ADHERE TO SAFETY PRECAUTIONS W/ASSISTANCE/DEVICE Description: STG Adhere to Safety Precautions With supervision/cues Assistance/Device. Outcome: Completed/Met

## 2019-11-02 NOTE — Progress Notes (Signed)
Patient ID: Barry Lewis, male   DOB: Aug 29, 1961, 58 y.o.   MRN: [id number] S: Doing well but disappointed in not going home today O:BP (!) 154/81   Pulse 76   Temp 98.9 F (37.2 C) (Oral)   Resp 16   Ht 5\' 6"  (1.676 m)   Wt 83.9 kg   SpO2 94%   BMI 29.85 kg/m   Intake/Output Summary (Last 24 hours) at 11/02/2019 1246 Last data filed at 11/02/2019 0900 Gross per 24 hour  Intake 1554 ml  Output 1650 ml  Net -96 ml   Intake/Output: I/O last 3 completed shifts: In: 1491 [P.O.:1491] Out: 3125 [Urine:3125]  Intake/Output this shift:  Total I/O In: 717 [P.O.:717] Out: 500 [Urine:500] Weight change: 1.3 kg Gen: NAD CVS: no rub Resp: cta Abd: benign Ext: no edema  Recent Labs  Lab 10/27/19 0829 10/28/19 1020 10/29/19 0521 10/30/19 0716 10/31/19 0556 11/02/19 0605  NA 135 136 137 137 135 137  K 5.2* 5.3* 4.8 4.8 5.1 5.0  CL 99 102 105 104 103 104  CO2 22 22 20* 20* 20* 21*  GLUCOSE 173* 148* 101* 123* 125* 109*  BUN 98* 104* 95* 95* 99* 105*  CREATININE 5.82* 6.33* 5.91* 5.96* 5.72* 6.10*  ALBUMIN 2.1* 2.3* 2.2* 2.2* 2.4* 2.3*  CALCIUM 8.6* 8.7* 8.8* 8.8* 9.1 8.5*  PHOS 6.5* 5.9* 5.7* 5.8* 5.8* 5.8*   Liver Function Tests: Recent Labs  Lab 10/30/19 0716 10/31/19 0556 11/02/19 0605  ALBUMIN 2.2* 2.4* 2.3*   No results for input(s): LIPASE, AMYLASE in the last 168 hours. No results for input(s): AMMONIA in the last 168 hours. CBC: Recent Labs  Lab 11/02/19 0605  WBC 4.6  HGB 9.8*  HCT 29.3*  MCV 97.3  PLT 255   Cardiac Enzymes: No results for input(s): CKTOTAL, CKMB, CKMBINDEX, TROPONINI in the last 168 hours. CBG: Recent Labs  Lab 11/01/19 1231 11/01/19 1654 11/01/19 2100 11/02/19 0613 11/02/19 1141  GLUCAP 188* 156* 179* 113* 245*    Iron Studies: No results for input(s): IRON, TIBC, TRANSFERRIN, FERRITIN in the last 72 hours. Studies/Results: No results found. [name] amLODipine  10 mg Oral Daily  . calcium acetate  1,334 mg Oral TID WC  .  carvedilol  12.5 mg Oral BID WC  . docusate sodium  100 mg Oral BID  . feeding supplement (NEPRO CARB STEADY)  237 mL Oral BID BM  . ferrous sulfate  325 mg Oral Q breakfast  . fluticasone  1 spray Each Nare Daily  . folic acid  1 mg Oral Daily  . gabapentin  300 mg Oral QHS  . heparin  5,000 Units Subcutaneous Q8H  . hydrALAZINE  100 mg Oral Q8H  . insulin aspart  0-15 Units Subcutaneous TID WC  . insulin glargine  18 Units Subcutaneous Daily  . lidocaine  2 patch Transdermal Q24H  . OLANZapine  5 mg Oral QHS  . polyethylene glycol  17 g Oral Daily  . sodium bicarbonate  1,300 mg Oral BID  . thiamine  100 mg Oral Daily  . traZODone  200 mg Oral QHS  . venlafaxine XR  225 mg Oral Q breakfast    BMET    Component Value Date/Time   NA 137 11/02/2019 0605   K 5.0 11/02/2019 0605   CL 104 11/02/2019 0605   CO2 21 (L) 11/02/2019 0605   GLUCOSE 109 (H) 11/02/2019 0605   BUN 105 (H) 11/02/2019 0605   CREATININE 6.10 (H) 11/02/2019 [id number]  CALCIUM 8.5 (L) 11/02/2019 0605   CALCIUM 7.8 (L) 10/21/2019 1211   GFRNONAA 9 (L) 11/02/2019 0605   GFRAA 11 (L) 11/02/2019 0605   CBC    Component Value Date/Time   WBC 4.6 11/02/2019 0605   RBC 3.01 (L) 11/02/2019 0605   HGB 9.8 (L) 11/02/2019 0605   HCT 29.3 (L) 11/02/2019 0605   PLT 255 11/02/2019 0605   MCV 97.3 11/02/2019 0605   MCH 32.6 11/02/2019 0605   MCHC 33.4 11/02/2019 0605   RDW 12.8 11/02/2019 0605   LYMPHSABS 0.8 10/19/2019 0521   MONOABS 0.6 10/19/2019 0521   EOSABS 0.3 10/19/2019 0521   BASOSABS 0.0 10/19/2019 0521   Assessment/Plan:  1. AKI/CKD stage 4- has history of AKI/CKD back in September 2020 when he presented with fall and multiple fractures, and required transient HD at that time. Cr peaked at 7.l24 but improved off of HD to 2.87 on 05/17/19. Cr peaked at 6.33 this admission and is slowly improving.   1. Recheck renal panel in am. 2. New baseline appears to be around 5.9. 3. BUN/CR up slightly but is  completely asymptomatic.  May want to increase his po intake 4. No indication for dialysis and will need to follow up closely with his Nephrologist in [place] area. 2. HTN- poorly controlled. Increase coreg to 12.5 mg bid and follow HR and bp. 3. Hyperkalemia- resolved but may need to start po lasix to control K. 4. Anemia of CKD- stable 5. IDDM- per primary  Donetta Potts, MD [facility] [phone]

## 2019-11-02 NOTE — Progress Notes (Signed)
Patient educated on dressing change and importance of preventing injury/trauma to limbs. Discussed DM and wound healing and foot protection. Patient performed left heel dressing change with direction from RN.
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
## 2019-11-03 LAB — GLUCOSE, CAPILLARY: Glucose-Capillary: 115 mg/dL — ABNORMAL HIGH (ref 70–99)

## 2019-11-03 NOTE — Progress Notes (Signed)
Patient transported via wheelchair by tech Thedore Mins and this nurse to transport home for discharge. All patient belongings transported with patient.

## 2019-11-03 NOTE — Progress Notes (Signed)
Social Work Discharge Note   The overall goal for the admission was met for:   Discharge location: Yes - pt returning home with very intermittent support of daughter  Length of Stay: Yes - delayed x 1 day due to weather - 16 days  Discharge activity level: Yes - modified ind overall  Home/community participation: Yes  Services provided included: MD, RD, PT, OT, SLP, RN, TR, Pharmacy, [place]: Medicare  Follow-up services arranged: Home Health: PT, OT, ST, MSW via [employer], DME: rollator, tub seat via [facility] and Patient/Family has no preference for HH/DME agencies  Comments (or additional information):    conatct - daughter, Clementeen Graham @ [phone]  Patient/Family verbalized understanding of follow-up arrangements: Yes  Individual responsible for coordination of the follow-up plan: pt  Confirmed correct DME delivered: Lennart Pall 11/03/2019    Tyrisha Benninger

## 2019-11-06 ENCOUNTER — Telehealth: Payer: Self-pay | Admitting: Registered Nurse

## 2019-11-06 NOTE — Telephone Encounter (Signed)
Placed a call to Ms. Collie Siad ( daughter of Mr. Hasbrook),. Also placed a call to Mr. Orsak, no answer. Awaiting a return call.

## 2019-11-08 ENCOUNTER — Other Ambulatory Visit: Payer: Self-pay | Admitting: Physical Medicine and Rehabilitation

## 2019-11-08 ENCOUNTER — Telehealth: Payer: Self-pay

## 2019-11-08 MED ORDER — SANTYL 250 UNIT/GM EX OINT: 1.0000 "application " | TOPICAL_OINTMENT | Freq: Every day | CUTANEOUS | 0 refills | Status: AC

## 2019-11-08 MED ORDER — BLOOD GLUCOSE MONITOR KIT: PACK | 0 refills | Status: AC

## 2019-11-08 NOTE — Telephone Encounter (Signed)
RN from [employer] called with update on [facility] nursing. She states patient needs a home glucometer, need prescription to get one. Also patient is out of cream for wound on foot and also needs something for debridement or referral to wound care.

## 2019-11-08 NOTE — Telephone Encounter (Signed)
Thank you! I have just put in both orders.

## 2019-11-15 ENCOUNTER — Encounter
Payer: Medicare Other | Attending: Physical Medicine and Rehabilitation | Admitting: Physical Medicine and Rehabilitation

## 2019-11-15 DIAGNOSIS — R1311 Dysphagia, oral phase: Secondary | ICD-10-CM | POA: Diagnosis not present

## 2019-11-15 DIAGNOSIS — S2243XD Multiple fractures of ribs, bilateral, subsequent encounter for fracture with routine healing: Secondary | ICD-10-CM

## 2019-11-15 DIAGNOSIS — S2241XD Multiple fractures of ribs, right side, subsequent encounter for fracture with routine healing: Secondary | ICD-10-CM

## 2019-11-15 DIAGNOSIS — S22019D Unspecified fracture of first thoracic vertebra, subsequent encounter for fracture with routine healing: Secondary | ICD-10-CM

## 2019-11-15 DIAGNOSIS — Z794 Long term (current) use of insulin: Secondary | ICD-10-CM

## 2019-11-15 DIAGNOSIS — Z9181 History of falling: Secondary | ICD-10-CM

## 2019-11-15 DIAGNOSIS — S42102D Fracture of unspecified part of scapula, left shoulder, subsequent encounter for fracture with routine healing: Secondary | ICD-10-CM

## 2019-11-15 DIAGNOSIS — I130 Hypertensive heart and chronic kidney disease with heart failure and stage 1 through stage 4 chronic kidney disease, or unspecified chronic kidney disease: Secondary | ICD-10-CM

## 2019-11-15 DIAGNOSIS — J449 Chronic obstructive pulmonary disease, unspecified: Secondary | ICD-10-CM

## 2019-11-15 DIAGNOSIS — S22029D Unspecified fracture of second thoracic vertebra, subsequent encounter for fracture with routine healing: Secondary | ICD-10-CM

## 2019-11-15 DIAGNOSIS — L97419 Non-pressure chronic ulcer of right heel and midfoot with unspecified severity: Secondary | ICD-10-CM | POA: Diagnosis not present

## 2019-11-15 DIAGNOSIS — E1142 Type 2 diabetes mellitus with diabetic polyneuropathy: Secondary | ICD-10-CM

## 2019-11-15 DIAGNOSIS — E1165 Type 2 diabetes mellitus with hyperglycemia: Secondary | ICD-10-CM

## 2019-11-15 DIAGNOSIS — E559 Vitamin D deficiency, unspecified: Secondary | ICD-10-CM

## 2019-11-15 DIAGNOSIS — E669 Obesity, unspecified: Secondary | ICD-10-CM

## 2019-11-15 DIAGNOSIS — L97429 Non-pressure chronic ulcer of left heel and midfoot with unspecified severity: Secondary | ICD-10-CM | POA: Diagnosis not present

## 2019-11-15 DIAGNOSIS — I5042 Chronic combined systolic (congestive) and diastolic (congestive) heart failure: Secondary | ICD-10-CM

## 2019-11-15 DIAGNOSIS — F0390 Unspecified dementia without behavioral disturbance: Secondary | ICD-10-CM

## 2019-11-15 DIAGNOSIS — E1122 Type 2 diabetes mellitus with diabetic chronic kidney disease: Secondary | ICD-10-CM

## 2019-11-15 DIAGNOSIS — Z8673 Personal history of transient ischemic attack (TIA), and cerebral infarction without residual deficits: Secondary | ICD-10-CM

## 2019-11-15 DIAGNOSIS — F319 Bipolar disorder, unspecified: Secondary | ICD-10-CM

## 2019-11-15 DIAGNOSIS — E11621 Type 2 diabetes mellitus with foot ulcer: Secondary | ICD-10-CM | POA: Diagnosis not present

## 2019-11-15 DIAGNOSIS — E11319 Type 2 diabetes mellitus with unspecified diabetic retinopathy without macular edema: Secondary | ICD-10-CM

## 2019-11-15 DIAGNOSIS — S22039D Unspecified fracture of third thoracic vertebra, subsequent encounter for fracture with routine healing: Secondary | ICD-10-CM

## 2019-11-15 DIAGNOSIS — N184 Chronic kidney disease, stage 4 (severe): Secondary | ICD-10-CM

## 2019-11-20 ENCOUNTER — Inpatient Hospital Stay (HOSPITAL_COMMUNITY): Payer: Medicare Other

## 2019-11-20 ENCOUNTER — Emergency Department (HOSPITAL_COMMUNITY): Payer: Medicare Other

## 2019-11-20 ENCOUNTER — Inpatient Hospital Stay (HOSPITAL_COMMUNITY)
Admission: EM | Admit: 2019-11-20 | Discharge: 2019-11-29 | DRG: 252 | Disposition: A | Discharge status: Home / Self Care | Payer: Medicare Other | Attending: Student in an Organized Health Care Education/Training Program | Admitting: Student in an Organized Health Care Education/Training Program

## 2019-11-20 ENCOUNTER — Other Ambulatory Visit: Payer: Self-pay

## 2019-11-20 ENCOUNTER — Encounter (HOSPITAL_COMMUNITY): Payer: Self-pay | Admitting: Emergency Medicine

## 2019-11-20 DIAGNOSIS — D649 Anemia, unspecified: Secondary | ICD-10-CM | POA: Diagnosis not present

## 2019-11-20 DIAGNOSIS — F259 Schizoaffective disorder, unspecified: Secondary | ICD-10-CM | POA: Diagnosis not present

## 2019-11-20 DIAGNOSIS — G9349 Other encephalopathy: Secondary | ICD-10-CM | POA: Diagnosis present

## 2019-11-20 DIAGNOSIS — Z9081 Acquired absence of spleen: Secondary | ICD-10-CM

## 2019-11-20 DIAGNOSIS — I5021 Acute systolic (congestive) heart failure: Secondary | ICD-10-CM

## 2019-11-20 DIAGNOSIS — N185 Chronic kidney disease, stage 5: Secondary | ICD-10-CM | POA: Diagnosis not present

## 2019-11-20 DIAGNOSIS — R627 Adult failure to thrive: Secondary | ICD-10-CM | POA: Diagnosis present

## 2019-11-20 DIAGNOSIS — E1122 Type 2 diabetes mellitus with diabetic chronic kidney disease: Secondary | ICD-10-CM | POA: Diagnosis present

## 2019-11-20 DIAGNOSIS — Z8673 Personal history of transient ischemic attack (TIA), and cerebral infarction without residual deficits: Secondary | ICD-10-CM

## 2019-11-20 DIAGNOSIS — N2581 Secondary hyperparathyroidism of renal origin: Secondary | ICD-10-CM | POA: Diagnosis present

## 2019-11-20 DIAGNOSIS — I509 Heart failure, unspecified: Secondary | ICD-10-CM

## 2019-11-20 DIAGNOSIS — R300 Dysuria: Secondary | ICD-10-CM | POA: Diagnosis not present

## 2019-11-20 DIAGNOSIS — M549 Dorsalgia, unspecified: Secondary | ICD-10-CM | POA: Diagnosis present

## 2019-11-20 DIAGNOSIS — D631 Anemia in chronic kidney disease: Secondary | ICD-10-CM | POA: Diagnosis present

## 2019-11-20 DIAGNOSIS — E8889 Other specified metabolic disorders: Secondary | ICD-10-CM | POA: Diagnosis present

## 2019-11-20 DIAGNOSIS — N289 Disorder of kidney and ureter, unspecified: Secondary | ICD-10-CM

## 2019-11-20 DIAGNOSIS — F0390 Unspecified dementia without behavioral disturbance: Secondary | ICD-10-CM | POA: Diagnosis present

## 2019-11-20 DIAGNOSIS — J9601 Acute respiratory failure with hypoxia: Secondary | ICD-10-CM | POA: Diagnosis present

## 2019-11-20 DIAGNOSIS — E1142 Type 2 diabetes mellitus with diabetic polyneuropathy: Secondary | ICD-10-CM | POA: Diagnosis present

## 2019-11-20 DIAGNOSIS — F1490 Cocaine use, unspecified, uncomplicated: Secondary | ICD-10-CM | POA: Diagnosis present

## 2019-11-20 DIAGNOSIS — I252 Old myocardial infarction: Secondary | ICD-10-CM | POA: Diagnosis not present

## 2019-11-20 DIAGNOSIS — E11621 Type 2 diabetes mellitus with foot ulcer: Secondary | ICD-10-CM | POA: Diagnosis present

## 2019-11-20 DIAGNOSIS — F250 Schizoaffective disorder, bipolar type: Secondary | ICD-10-CM | POA: Diagnosis present

## 2019-11-20 DIAGNOSIS — E871 Hypo-osmolality and hyponatremia: Secondary | ICD-10-CM | POA: Diagnosis present

## 2019-11-20 DIAGNOSIS — I132 Hypertensive heart and chronic kidney disease with heart failure and with stage 5 chronic kidney disease, or end stage renal disease: Principal | ICD-10-CM | POA: Diagnosis present

## 2019-11-20 DIAGNOSIS — I5043 Acute on chronic combined systolic (congestive) and diastolic (congestive) heart failure: Secondary | ICD-10-CM | POA: Diagnosis present

## 2019-11-20 DIAGNOSIS — E11319 Type 2 diabetes mellitus with unspecified diabetic retinopathy without macular edema: Secondary | ICD-10-CM | POA: Diagnosis present

## 2019-11-20 DIAGNOSIS — I5023 Acute on chronic systolic (congestive) heart failure: Secondary | ICD-10-CM | POA: Diagnosis not present

## 2019-11-20 DIAGNOSIS — Z992 Dependence on renal dialysis: Secondary | ICD-10-CM | POA: Diagnosis not present

## 2019-11-20 DIAGNOSIS — J449 Chronic obstructive pulmonary disease, unspecified: Secondary | ICD-10-CM | POA: Diagnosis present

## 2019-11-20 DIAGNOSIS — Z7951 Long term (current) use of inhaled steroids: Secondary | ICD-10-CM | POA: Diagnosis not present

## 2019-11-20 DIAGNOSIS — I059 Rheumatic mitral valve disease, unspecified: Secondary | ICD-10-CM | POA: Diagnosis not present

## 2019-11-20 DIAGNOSIS — I340 Nonrheumatic mitral (valve) insufficiency: Secondary | ICD-10-CM | POA: Diagnosis not present

## 2019-11-20 DIAGNOSIS — Z20822 Contact with and (suspected) exposure to covid-19: Secondary | ICD-10-CM | POA: Diagnosis present

## 2019-11-20 DIAGNOSIS — I5189 Other ill-defined heart diseases: Secondary | ICD-10-CM

## 2019-11-20 DIAGNOSIS — E875 Hyperkalemia: Secondary | ICD-10-CM | POA: Diagnosis present

## 2019-11-20 DIAGNOSIS — I330 Acute and subacute infective endocarditis: Secondary | ICD-10-CM | POA: Diagnosis not present

## 2019-11-20 DIAGNOSIS — F17210 Nicotine dependence, cigarettes, uncomplicated: Secondary | ICD-10-CM | POA: Diagnosis present

## 2019-11-20 DIAGNOSIS — E872 Acidosis: Secondary | ICD-10-CM | POA: Diagnosis present

## 2019-11-20 DIAGNOSIS — G9340 Encephalopathy, unspecified: Secondary | ICD-10-CM

## 2019-11-20 DIAGNOSIS — Z79899 Other long term (current) drug therapy: Secondary | ICD-10-CM

## 2019-11-20 DIAGNOSIS — N186 End stage renal disease: Secondary | ICD-10-CM | POA: Diagnosis not present

## 2019-11-20 DIAGNOSIS — I440 Atrioventricular block, first degree: Secondary | ICD-10-CM | POA: Diagnosis not present

## 2019-11-20 DIAGNOSIS — G8929 Other chronic pain: Secondary | ICD-10-CM | POA: Diagnosis present

## 2019-11-20 DIAGNOSIS — L97419 Non-pressure chronic ulcer of right heel and midfoot with unspecified severity: Secondary | ICD-10-CM | POA: Diagnosis present

## 2019-11-20 DIAGNOSIS — N179 Acute kidney failure, unspecified: Secondary | ICD-10-CM | POA: Diagnosis present

## 2019-11-20 DIAGNOSIS — Z87441 Personal history of nephrotic syndrome: Secondary | ICD-10-CM

## 2019-11-20 DIAGNOSIS — Z9114 Patient's other noncompliance with medication regimen: Secondary | ICD-10-CM

## 2019-11-20 DIAGNOSIS — Z794 Long term (current) use of insulin: Secondary | ICD-10-CM

## 2019-11-20 LAB — URINALYSIS, ROUTINE W REFLEX MICROSCOPIC
Bilirubin Urine: NEGATIVE
Glucose, UA: 50 mg/dL — AB
Hgb urine dipstick: NEGATIVE
Ketones, ur: NEGATIVE mg/dL
Leukocytes,Ua: NEGATIVE
Nitrite: NEGATIVE
Protein, ur: 100 mg/dL — AB
Specific Gravity, Urine: 1.006 (ref 1.005–1.030)
pH: 7 (ref 5.0–8.0)

## 2019-11-20 LAB — COMPREHENSIVE METABOLIC PANEL
ALT: 19 U/L (ref 0–44)
AST: 19 U/L (ref 15–41)
Albumin: 2.5 g/dL — ABNORMAL LOW (ref 3.5–5.0)
Alkaline Phosphatase: 146 U/L — ABNORMAL HIGH (ref 38–126)
Anion gap: 10 (ref 5–15)
BUN: 61 mg/dL — ABNORMAL HIGH (ref 6–20)
CO2: 17 mmol/L — ABNORMAL LOW (ref 22–32)
Calcium: 7.9 mg/dL — ABNORMAL LOW (ref 8.9–10.3)
Chloride: 98 mmol/L (ref 98–111)
Creatinine, Ser: 5.51 mg/dL — ABNORMAL HIGH (ref 0.61–1.24)
GFR calc Af Amer: 12 mL/min — ABNORMAL LOW (ref >60)
GFR calc non Af Amer: 11 mL/min — ABNORMAL LOW (ref >60)
Glucose, Bld: 105 mg/dL — ABNORMAL HIGH (ref 70–99)
Potassium: 5.1 mmol/L (ref 3.5–5.1)
Sodium: 125 mmol/L — ABNORMAL LOW (ref 135–145)
Total Bilirubin: 0.5 mg/dL (ref 0.3–1.2)
Total Protein: 6.2 g/dL — ABNORMAL LOW (ref 6.5–8.1)

## 2019-11-20 LAB — GLUCOSE, CAPILLARY: Glucose-Capillary: 223 mg/dL — ABNORMAL HIGH (ref 70–99)

## 2019-11-20 LAB — POCT I-STAT EG7
Acid-base deficit: 5 mmol/L — ABNORMAL HIGH (ref 0.0–2.0)
Bicarbonate: 17.9 mmol/L — ABNORMAL LOW (ref 20.0–28.0)
Calcium, Ion: 0.99 mmol/L — ABNORMAL LOW (ref 1.15–1.40)
HCT: 26 % — ABNORMAL LOW (ref 39.0–52.0)
Hemoglobin: 8.8 g/dL — ABNORMAL LOW (ref 13.0–17.0)
O2 Saturation: 97 %
Potassium: 5.1 mmol/L (ref 3.5–5.1)
Sodium: 126 mmol/L — ABNORMAL LOW (ref 135–145)
TCO2: 19 mmol/L — ABNORMAL LOW (ref 22–32)
pCO2, Ven: 26.6 mmHg — ABNORMAL LOW (ref 44.0–60.0)
pH, Ven: 7.435 — ABNORMAL HIGH (ref 7.250–7.430)
pO2, Ven: 84 mmHg — ABNORMAL HIGH (ref 32.0–45.0)

## 2019-11-20 LAB — CBC WITH DIFFERENTIAL/PLATELET
Abs Immature Granulocytes: 0.02 10*3/uL (ref 0.00–0.07)
Basophils Absolute: 0 10*3/uL (ref 0.0–0.1)
Basophils Relative: 0 %
Eosinophils Absolute: 0.3 10*3/uL (ref 0.0–0.5)
Eosinophils Relative: 4 %
HCT: 27.2 % — ABNORMAL LOW (ref 39.0–52.0)
Hemoglobin: 9 g/dL — ABNORMAL LOW (ref 13.0–17.0)
Immature Granulocytes: 0 %
Lymphocytes Relative: 13 %
Lymphs Abs: 0.9 10*3/uL (ref 0.7–4.0)
MCH: 31.5 pg (ref 26.0–34.0)
MCHC: 33.1 g/dL (ref 30.0–36.0)
MCV: 95.1 fL (ref 80.0–100.0)
Monocytes Absolute: 0.5 10*3/uL (ref 0.1–1.0)
Monocytes Relative: 7 %
Neutro Abs: 5.4 10*3/uL (ref 1.7–7.7)
Neutrophils Relative %: 76 %
Platelets: 239 10*3/uL (ref 150–400)
RBC: 2.86 MIL/uL — ABNORMAL LOW (ref 4.22–5.81)
RDW: 12.7 % (ref 11.5–15.5)
WBC: 7.1 10*3/uL (ref 4.0–10.5)
nRBC: 0 % (ref 0.0–0.2)

## 2019-11-20 LAB — BASIC METABOLIC PANEL
Anion gap: 12 (ref 5–15)
BUN: 63 mg/dL — ABNORMAL HIGH (ref 6–20)
CO2: 16 mmol/L — ABNORMAL LOW (ref 22–32)
Calcium: 7.4 mg/dL — ABNORMAL LOW (ref 8.9–10.3)
Chloride: 93 mmol/L — ABNORMAL LOW (ref 98–111)
Creatinine, Ser: 5.74 mg/dL — ABNORMAL HIGH (ref 0.61–1.24)
GFR calc Af Amer: 12 mL/min — ABNORMAL LOW (ref >60)
GFR calc non Af Amer: 10 mL/min — ABNORMAL LOW (ref >60)
Glucose, Bld: 157 mg/dL — ABNORMAL HIGH (ref 70–99)
Potassium: 5.8 mmol/L — ABNORMAL HIGH (ref 3.5–5.1)
Sodium: 121 mmol/L — ABNORMAL LOW (ref 135–145)

## 2019-11-20 LAB — CBG MONITORING, ED: Glucose-Capillary: 120 mg/dL — ABNORMAL HIGH (ref 70–99)

## 2019-11-20 LAB — RAPID URINE DRUG SCREEN, HOSP PERFORMED
Amphetamines: NOT DETECTED
Barbiturates: NOT DETECTED
Benzodiazepines: NOT DETECTED
Cocaine: POSITIVE — AB
Opiates: NOT DETECTED
Tetrahydrocannabinol: NOT DETECTED

## 2019-11-20 LAB — ECHOCARDIOGRAM COMPLETE
Height: 66 in
Weight: 2962.98 oz

## 2019-11-20 LAB — CARBAMAZEPINE LEVEL, TOTAL: Carbamazepine Lvl: 7.2 ug/mL (ref 4.0–12.0)

## 2019-11-20 LAB — ETHANOL: Alcohol, Ethyl (B): <10 mg/dL (ref <10)

## 2019-11-20 LAB — TROPONIN I (HIGH SENSITIVITY): Troponin I (High Sensitivity): 26 ng/L — ABNORMAL HIGH (ref <18)

## 2019-11-20 LAB — SARS CORONAVIRUS 2 (TAT 6-24 HRS): SARS Coronavirus 2: NEGATIVE

## 2019-11-20 MED ORDER — INSULIN ASPART 100 UNIT/ML ~~LOC~~ SOLN
0.0000 [IU] | Freq: Three times a day (TID) | SUBCUTANEOUS | Status: DC
  Administered 2019-11-21: 5 [IU] via SUBCUTANEOUS
  Administered 2019-11-21: 3 [IU] via SUBCUTANEOUS

## 2019-11-20 MED ORDER — FUROSEMIDE 10 MG/ML IJ SOLN
80.0000 mg | Freq: Two times a day (BID) | INTRAMUSCULAR | Status: DC
  Administered 2019-11-20 – 2019-11-21 (×2): 80 mg via INTRAVENOUS
  Filled 2019-11-20 (×2): qty 8

## 2019-11-20 MED ORDER — OXYCODONE-ACETAMINOPHEN 5-325 MG PO TABS
2.0000 | ORAL_TABLET | Freq: Once | ORAL | Status: AC
  Administered 2019-11-20: 2 via ORAL
  Filled 2019-11-20: qty 2

## 2019-11-20 MED ORDER — IPRATROPIUM-ALBUTEROL 0.5-2.5 (3) MG/3ML IN SOLN
3.0000 mL | Freq: Once | RESPIRATORY_TRACT | Status: AC
  Administered 2019-11-20: 3 mL via RESPIRATORY_TRACT

## 2019-11-20 MED ORDER — TRAZODONE HCL 100 MG PO TABS
200.0000 mg | ORAL_TABLET | Freq: Every day | ORAL | Status: DC
  Administered 2019-11-20 – 2019-11-28 (×9): 200 mg via ORAL
  Filled 2019-11-20 (×9): qty 2

## 2019-11-20 MED ORDER — OLANZAPINE 5 MG PO TABS
5.0000 mg | ORAL_TABLET | Freq: Every day | ORAL | Status: DC
  Administered 2019-11-20 – 2019-11-28 (×9): 5 mg via ORAL
  Filled 2019-11-20 (×10): qty 1

## 2019-11-20 MED ORDER — INSULIN GLARGINE 100 UNIT/ML ~~LOC~~ SOLN
10.0000 [IU] | Freq: Every day | SUBCUTANEOUS | Status: DC
  Administered 2019-11-20 – 2019-11-28 (×9): 10 [IU] via SUBCUTANEOUS
  Filled 2019-11-20 (×11): qty 0.1

## 2019-11-20 MED ORDER — FOLIC ACID 1 MG PO TABS
1.0000 mg | ORAL_TABLET | Freq: Every day | ORAL | Status: DC
  Administered 2019-11-20 – 2019-11-29 (×10): 1 mg via ORAL
  Filled 2019-11-20 (×11): qty 1

## 2019-11-20 MED ORDER — FLUTICASONE PROPIONATE 50 MCG/ACT NA SUSP
2.0000 | Freq: Every day | NASAL | Status: DC | PRN
  Filled 2019-11-20: qty 16

## 2019-11-20 MED ORDER — IPRATROPIUM-ALBUTEROL 0.5-2.5 (3) MG/3ML IN SOLN
3.0000 mL | Freq: Four times a day (QID) | RESPIRATORY_TRACT | Status: DC | PRN
  Administered 2019-11-23 – 2019-11-24 (×2): 3 mL via RESPIRATORY_TRACT
  Filled 2019-11-20 (×3): qty 3

## 2019-11-20 MED ORDER — IPRATROPIUM-ALBUTEROL 0.5-2.5 (3) MG/3ML IN SOLN: 3.0000 mL | Freq: Once | RESPIRATORY_TRACT | Status: DC

## 2019-11-20 MED ORDER — ACETAMINOPHEN 650 MG RE SUPP: 650.0000 mg | Freq: Four times a day (QID) | RECTAL | Status: DC | PRN

## 2019-11-20 MED ORDER — ACETAMINOPHEN 325 MG PO TABS
650.0000 mg | ORAL_TABLET | Freq: Four times a day (QID) | ORAL | Status: DC | PRN
  Administered 2019-11-20 – 2019-11-22 (×2): 650 mg via ORAL
  Filled 2019-11-20 (×2): qty 2

## 2019-11-20 MED ORDER — UMECLIDINIUM BROMIDE 62.5 MCG/INH IN AEPB
1.0000 | INHALATION_SPRAY | Freq: Every day | RESPIRATORY_TRACT | Status: DC
  Administered 2019-11-21 – 2019-11-28 (×4): 1 via RESPIRATORY_TRACT
  Filled 2019-11-20: qty 7

## 2019-11-20 MED ORDER — SODIUM ZIRCONIUM CYCLOSILICATE 10 G PO PACK: 10.0000 g | PACK | Freq: Once | ORAL | Status: DC

## 2019-11-20 MED ORDER — HYDRALAZINE HCL 50 MG PO TABS
100.0000 mg | ORAL_TABLET | Freq: Three times a day (TID) | ORAL | Status: DC
  Administered 2019-11-20: 100 mg via ORAL
  Filled 2019-11-20 (×3): qty 2

## 2019-11-20 MED ORDER — INSULIN ASPART 100 UNIT/ML ~~LOC~~ SOLN
0.0000 [IU] | Freq: Every day | SUBCUTANEOUS | Status: DC
  Administered 2019-11-20: 2 [IU] via SUBCUTANEOUS
  Administered 2019-11-24: 4 [IU] via SUBCUTANEOUS
  Administered 2019-11-25 – 2019-11-26 (×2): 2 [IU] via SUBCUTANEOUS

## 2019-11-20 MED ORDER — SODIUM CHLORIDE 0.9% FLUSH
3.0000 mL | Freq: Two times a day (BID) | INTRAVENOUS | Status: DC
  Administered 2019-11-20 – 2019-11-29 (×16): 3 mL via INTRAVENOUS

## 2019-11-20 MED ORDER — AMLODIPINE BESYLATE 10 MG PO TABS
10.0000 mg | ORAL_TABLET | Freq: Every day | ORAL | Status: DC
  Administered 2019-11-21 – 2019-11-22 (×2): 10 mg via ORAL
  Filled 2019-11-20 (×2): qty 1

## 2019-11-20 MED ORDER — VENLAFAXINE HCL ER 75 MG PO CP24
150.0000 mg | ORAL_CAPSULE | Freq: Every day | ORAL | Status: DC
  Administered 2019-11-21 – 2019-11-29 (×8): 150 mg via ORAL
  Filled 2019-11-20 (×8): qty 2
  Filled 2019-11-20: qty 1
  Filled 2019-11-20 (×3): qty 2

## 2019-11-20 MED ORDER — HEPARIN SODIUM (PORCINE) 5000 UNIT/ML IJ SOLN
5000.0000 [IU] | Freq: Three times a day (TID) | INTRAMUSCULAR | Status: DC
  Administered 2019-11-20 – 2019-11-29 (×23): 5000 [IU] via SUBCUTANEOUS
  Filled 2019-11-20 (×24): qty 1

## 2019-11-20 MED ORDER — IPRATROPIUM-ALBUTEROL 0.5-2.5 (3) MG/3ML IN SOLN
3.0000 mL | Freq: Once | RESPIRATORY_TRACT | Status: AC
  Administered 2019-11-20: 3 mL via RESPIRATORY_TRACT
  Filled 2019-11-20: qty 3

## 2019-11-20 MED ORDER — CARBAMAZEPINE ER 200 MG PO TB12
600.0000 mg | ORAL_TABLET | Freq: Every day | ORAL | Status: DC
  Administered 2019-11-20 – 2019-11-28 (×9): 600 mg via ORAL
  Filled 2019-11-20 (×11): qty 3

## 2019-11-20 MED ORDER — PREDNISONE 20 MG PO TABS
40.0000 mg | ORAL_TABLET | Freq: Every day | ORAL | Status: DC
  Administered 2019-11-21: 40 mg via ORAL
  Filled 2019-11-20: qty 2

## 2019-11-20 MED ORDER — CARVEDILOL 12.5 MG PO TABS
12.5000 mg | ORAL_TABLET | Freq: Two times a day (BID) | ORAL | Status: DC
  Administered 2019-11-20: 12.5 mg via ORAL
  Filled 2019-11-20: qty 1

## 2019-11-20 MED ORDER — CALCIUM ACETATE (PHOS BINDER) 667 MG PO CAPS
1334.0000 mg | ORAL_CAPSULE | Freq: Three times a day (TID) | ORAL | Status: DC
  Administered 2019-11-20 – 2019-11-29 (×21): 1334 mg via ORAL
  Filled 2019-11-20 (×24): qty 2

## 2019-11-20 MED ORDER — ISOSORB DINITRATE-HYDRALAZINE 20-37.5 MG PO TABS
1.0000 | ORAL_TABLET | Freq: Three times a day (TID) | ORAL | Status: DC
  Administered 2019-11-21 – 2019-11-25 (×15): 1 via ORAL
  Filled 2019-11-20 (×15): qty 1

## 2019-11-20 MED ORDER — FLUTICASONE FUROATE-VILANTEROL 100-25 MCG/INH IN AEPB
1.0000 | INHALATION_SPRAY | Freq: Every day | RESPIRATORY_TRACT | Status: DC
  Administered 2019-11-21 – 2019-11-28 (×5): 1 via RESPIRATORY_TRACT
  Filled 2019-11-20: qty 28

## 2019-11-20 MED ORDER — FUROSEMIDE 10 MG/ML IJ SOLN
80.0000 mg | Freq: Once | INTRAMUSCULAR | Status: AC
  Administered 2019-11-20: 80 mg via INTRAVENOUS
  Filled 2019-11-20: qty 8

## 2019-11-20 MED ORDER — GABAPENTIN 600 MG PO TABS
300.0000 mg | ORAL_TABLET | Freq: Every day | ORAL | Status: DC
  Administered 2019-11-20 – 2019-11-28 (×9): 300 mg via ORAL
  Filled 2019-11-20 (×6): qty 1
  Filled 2019-11-20: qty 0.5
  Filled 2019-11-20 (×3): qty 1

## 2019-11-20 MED ORDER — FERROUS SULFATE 325 (65 FE) MG PO TABS
325.0000 mg | ORAL_TABLET | Freq: Every day | ORAL | Status: DC
  Administered 2019-11-21 – 2019-11-29 (×9): 325 mg via ORAL
  Filled 2019-11-20 (×11): qty 1

## 2019-11-20 NOTE — Plan of Care (Addendum)
Paged to pt room for SOB. Pt here with acute on chronic HFmrEF (EF of 45-50%) exacerbation.   Pt has been satting well but was placed on non rebreather for comfort. He is anxious and reports feeling like he's suffocating.   On exam, pt has crackles to the mid lung with wheezing throughout.  Patient reports he uses an inhaler regularly at home but ran out 2 weeks ago.   He is diuresing well on current IV lasix dose, currently net negative 1300 mls.  Action taken: -sat patient up in bed -ordered Joelyn Oms, MD 11/20/2019, 11:29 PM Pager: [phone]

## 2019-11-20 NOTE — ED Notes (Addendum)
Admitting at bedside, pt wheezing and having a hard time catching his breath. Pt placed on non- rebreather for comfort. IV access gained.
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
## 2019-11-20 NOTE — ED Triage Notes (Signed)
Pt arrives POV with complaints of SOB X3 days and has been getting worse. Pt states he recently fell and fractured ribs. Pt has auditory wheezing during triage. Sp02 92% RA. Placed on 2 liters for comfort

## 2019-11-20 NOTE — Progress Notes (Signed)
Patient bradycardic,  HR 30's to 40's.  MD notified via text page.

## 2019-11-20 NOTE — ED Notes (Signed)
Lunch tray ordered 

## 2019-11-20 NOTE — Progress Notes (Signed)
MD on call texted page patient HR is 34 bp 161/70 98% 15 L non rebreather. Arthor Captain LPN

## 2019-11-20 NOTE — ED Notes (Signed)
Bladder scan: 33mL

## 2019-11-20 NOTE — ED Notes (Signed)
CBG 120 
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
## 2019-11-20 NOTE — ED Notes (Signed)
Pt HR 36 Sinus Barry Lewis pt complaining of chest pain and shortness of breath. Pt has no IV access. RN paged admitting and attempted to gain IV access.

## 2019-11-20 NOTE — ED Notes (Signed)
Spoke with sister and provided update. Sister is concerned pt is unable to care for himself at home. Also pt states he is suppose to be on HD but can not get to and from treatment. Family is wanting help possibly from case management or social work to help provide transportation

## 2019-11-20 NOTE — ED Notes (Signed)
Tele   Paged admitting to RN Chester Holstein

## 2019-11-20 NOTE — ED Notes (Signed)
Call pt sister, laura for updates [phone]

## 2019-11-20 NOTE — ED Notes (Signed)
Help get patient undress on the monitor

## 2019-11-20 NOTE — Progress Notes (Signed)
Echocardiogram 2D Echocardiogram has been performed.  Oneal Deputy Banner Huckaba 11/20/2019, 3:01 PM

## 2019-11-20 NOTE — ED Notes (Signed)
Patient was incont of urine , linens  Changed urinal available

## 2019-11-21 DIAGNOSIS — G9340 Encephalopathy, unspecified: Secondary | ICD-10-CM

## 2019-11-21 DIAGNOSIS — N2581 Secondary hyperparathyroidism of renal origin: Secondary | ICD-10-CM

## 2019-11-21 DIAGNOSIS — I440 Atrioventricular block, first degree: Secondary | ICD-10-CM

## 2019-11-21 DIAGNOSIS — I5023 Acute on chronic systolic (congestive) heart failure: Secondary | ICD-10-CM

## 2019-11-21 DIAGNOSIS — E871 Hypo-osmolality and hyponatremia: Secondary | ICD-10-CM

## 2019-11-21 DIAGNOSIS — I5189 Other ill-defined heart diseases: Secondary | ICD-10-CM

## 2019-11-21 DIAGNOSIS — I132 Hypertensive heart and chronic kidney disease with heart failure and with stage 5 chronic kidney disease, or end stage renal disease: Principal | ICD-10-CM

## 2019-11-21 DIAGNOSIS — E875 Hyperkalemia: Secondary | ICD-10-CM

## 2019-11-21 DIAGNOSIS — I340 Nonrheumatic mitral (valve) insufficiency: Secondary | ICD-10-CM

## 2019-11-21 DIAGNOSIS — Z79899 Other long term (current) drug therapy: Secondary | ICD-10-CM

## 2019-11-21 DIAGNOSIS — Z794 Long term (current) use of insulin: Secondary | ICD-10-CM

## 2019-11-21 DIAGNOSIS — E1122 Type 2 diabetes mellitus with diabetic chronic kidney disease: Secondary | ICD-10-CM

## 2019-11-21 DIAGNOSIS — Z9119 Patient's noncompliance with other medical treatment and regimen: Secondary | ICD-10-CM

## 2019-11-21 DIAGNOSIS — F259 Schizoaffective disorder, unspecified: Secondary | ICD-10-CM

## 2019-11-21 DIAGNOSIS — J9601 Acute respiratory failure with hypoxia: Secondary | ICD-10-CM

## 2019-11-21 DIAGNOSIS — F1410 Cocaine abuse, uncomplicated: Secondary | ICD-10-CM

## 2019-11-21 DIAGNOSIS — N185 Chronic kidney disease, stage 5: Secondary | ICD-10-CM

## 2019-11-21 DIAGNOSIS — J449 Chronic obstructive pulmonary disease, unspecified: Secondary | ICD-10-CM

## 2019-11-21 DIAGNOSIS — R300 Dysuria: Secondary | ICD-10-CM

## 2019-11-21 DIAGNOSIS — N289 Disorder of kidney and ureter, unspecified: Secondary | ICD-10-CM

## 2019-11-21 DIAGNOSIS — I5021 Acute systolic (congestive) heart failure: Secondary | ICD-10-CM

## 2019-11-21 DIAGNOSIS — Z7951 Long term (current) use of inhaled steroids: Secondary | ICD-10-CM

## 2019-11-21 DIAGNOSIS — D649 Anemia, unspecified: Secondary | ICD-10-CM

## 2019-11-21 LAB — CBC
HCT: 26.6 % — ABNORMAL LOW (ref 39.0–52.0)
Hemoglobin: 8.9 g/dL — ABNORMAL LOW (ref 13.0–17.0)
MCH: 31.7 pg (ref 26.0–34.0)
MCHC: 33.5 g/dL (ref 30.0–36.0)
MCV: 94.7 fL (ref 80.0–100.0)
Platelets: 228 10*3/uL (ref 150–400)
RBC: 2.81 MIL/uL — ABNORMAL LOW (ref 4.22–5.81)
RDW: 12.7 % (ref 11.5–15.5)
WBC: 7.1 10*3/uL (ref 4.0–10.5)
nRBC: 0 % (ref 0.0–0.2)

## 2019-11-21 LAB — LIPID PANEL
Cholesterol: 164 mg/dL (ref 0–200)
HDL: 55 mg/dL (ref >40)
LDL Cholesterol: 87 mg/dL (ref 0–99)
Total CHOL/HDL Ratio: 3 RATIO
Triglycerides: 108 mg/dL (ref <150)
VLDL: 22 mg/dL (ref 0–40)

## 2019-11-21 LAB — GLUCOSE, CAPILLARY
Glucose-Capillary: 69 mg/dL — ABNORMAL LOW (ref 70–99)
Glucose-Capillary: 74 mg/dL (ref 70–99)
Glucose-Capillary: 79 mg/dL (ref 70–99)
Glucose-Capillary: 209 mg/dL — ABNORMAL HIGH (ref 70–99)
Glucose-Capillary: 185 mg/dL — ABNORMAL HIGH (ref 70–99)
Glucose-Capillary: 131 mg/dL — ABNORMAL HIGH (ref 70–99)

## 2019-11-21 LAB — RENAL FUNCTION PANEL
Albumin: 2.5 g/dL — ABNORMAL LOW (ref 3.5–5.0)
Anion gap: 14 (ref 5–15)
BUN: 67 mg/dL — ABNORMAL HIGH (ref 6–20)
CO2: 17 mmol/L — ABNORMAL LOW (ref 22–32)
Calcium: 7.9 mg/dL — ABNORMAL LOW (ref 8.9–10.3)
Chloride: 94 mmol/L — ABNORMAL LOW (ref 98–111)
Creatinine, Ser: 5.76 mg/dL — ABNORMAL HIGH (ref 0.61–1.24)
GFR calc Af Amer: 12 mL/min — ABNORMAL LOW (ref >60)
GFR calc non Af Amer: 10 mL/min — ABNORMAL LOW (ref >60)
Glucose, Bld: 97 mg/dL (ref 70–99)
Phosphorus: 7.7 mg/dL — ABNORMAL HIGH (ref 2.5–4.6)
Potassium: 5.6 mmol/L — ABNORMAL HIGH (ref 3.5–5.1)
Sodium: 125 mmol/L — ABNORMAL LOW (ref 135–145)

## 2019-11-21 LAB — MAGNESIUM: Magnesium: 2.1 mg/dL (ref 1.7–2.4)

## 2019-11-21 MED ORDER — DARBEPOETIN ALFA 150 MCG/0.3ML IJ SOSY
150.0000 ug | PREFILLED_SYRINGE | INTRAMUSCULAR | Status: DC
  Administered 2019-11-22: 150 ug via SUBCUTANEOUS
  Filled 2019-11-21 (×2): qty 0.3

## 2019-11-21 MED ORDER — GLUCOSE 40 % PO GEL
1.0000 | ORAL | Status: AC
  Filled 2019-11-21: qty 1

## 2019-11-21 MED ORDER — FUROSEMIDE 10 MG/ML IJ SOLN
160.0000 mg | Freq: Three times a day (TID) | INTRAVENOUS | Status: DC
  Administered 2019-11-22 – 2019-11-23 (×3): 160 mg via INTRAVENOUS
  Filled 2019-11-21 (×6): qty 16

## 2019-11-21 MED ORDER — FUROSEMIDE 10 MG/ML IJ SOLN
80.0000 mg | Freq: Three times a day (TID) | INTRAMUSCULAR | Status: DC
  Administered 2019-11-21 (×2): 80 mg via INTRAVENOUS
  Filled 2019-11-21 (×2): qty 8

## 2019-11-21 MED ORDER — SODIUM ZIRCONIUM CYCLOSILICATE 10 G PO PACK
10.0000 g | PACK | Freq: Once | ORAL | Status: AC
  Administered 2019-11-21: 10 g via ORAL
  Filled 2019-11-21: qty 1
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
## 2019-11-21 NOTE — Progress Notes (Signed)
Patient requesting to remove nonrebreather mask so he can eat breakfast, wean down to 10LNC, saturating at 95%. Tech feeding patient.

## 2019-11-21 NOTE — Progress Notes (Signed)
CSW attempted to complete assessment for SNF rec however patient completely out of breath and gasping for air, to tired to talk at this time.   Will try back later.   [place], [place]

## 2019-11-21 NOTE — Progress Notes (Signed)
MD on call text that patient bp is 170/83. Attempt was made to switch to nasal canula 7 liters patient de stats to 88% was place back on the nonrebreather. Will continue to monitor. Arthor Captain LPN

## 2019-11-21 NOTE — Evaluation (Signed)
Occupational Therapy Evaluation Patient Details Name: Barry Lewis MRN: [id number] DOB: July 19, 1961 Today's Date: 11/21/2019    History of Present Illness Pt is a 59 yo male presenting with shortness of breath x3 days and reports of a recent fall. Upon admission, pt dx with acute hypoxic resp failure due to CHF exacerbation. Pt PMH includes: CVA, schizoaffective disorder, polysubstance abuse, hyperlipidemia, COPD, CKD III, chronic CHF, and recent hospitalization after a fall-resulting in multiple rib fractures.   Clinical Impression   Patient is a 59 year old male that lives alone in an apartment with 1 step to enter. Patient reports he is modified independent at baseline, however has fallen due to reports of dizziness. Patient demonstrates poor insight, reports getting up quickly at home without any AD, feeling dizzy and falling. Also during therapy session patient has difficulty self monitoring his energy level, making comments regarding feeling tired but does not stop for rest break until therapists prompt. Patient also unfamiliar with pursed lip breathing techniques requiring multiple cues for carry over due to breathing heavily through his mouth.   Due to cognitive and safety concerns during self care and functional mobility recommending SNF at this time, unless patient can have 24/7 supervision at home.     Follow Up Recommendations  SNF;Other (comment)(vs HH if 24/7 supervision is available)    Equipment Recommendations  Other (comment)(defer to next venue)       Precautions / Restrictions Precautions Precautions: Fall Precaution Booklet Issued: No Precaution Comments: Reviewed log roll for comfort due to fracture Restrictions Weight Bearing Restrictions: No      Mobility Bed Mobility Overal bed mobility: Needs Assistance Bed Mobility: Supine to Sit     Supine to sit: Supervision     General bed mobility comments: pt able to come to EOB without assist, supervision for line  management only  Transfers Overall transfer level: Needs assistance Equipment used: Rolling walker (2 wheeled) Transfers: Sit to/from Stand Sit to Stand: Min guard         General transfer comment: for safety    Balance Overall balance assessment: Needs assistance;History of Falls Sitting-balance support: No upper extremity supported;Feet supported Sitting balance-Leahy Scale: Fair Sitting balance - Comments: supervision   Standing balance support: Bilateral upper extremity supported;During functional activity Standing balance-Leahy Scale: Fair Standing balance comment: able to static stand without UE support, reliant on BUE support for ambulation                           ADL either performed or assessed with clinical judgement   ADL Overall ADL's : Needs assistance/impaired Eating/Feeding: Independent   Grooming: Sitting;Supervision/safety   Upper Body Bathing: Sitting;Supervision/ safety   Lower Body Bathing: Moderate assistance;Sitting/lateral leans;Sit to/from stand   Upper Body Dressing : Supervision/safety;Sitting   Lower Body Dressing: Moderate assistance;Sitting/lateral leans;Sit to/from stand Lower Body Dressing Details (indicate cue type and reason): due to decreased activity tolerance, dizziness with activity Toilet Transfer: Ambulation;RW;Cueing for safety;Regular Toilet;Min guard Toilet Transfer Details (indicate cue type and reason): simulated with functional mobility, cues for body mechancis Toileting- Clothing Manipulation and Hygiene: Min guard;Sitting/lateral lean;Sit to/from stand       Functional mobility during ADLs: Minimal assistance;+2 for safety/equipment;Cueing for safety;Rolling walker(chair follow recommended)                    Pertinent Vitals/Pain Pain Assessment: No/denies pain     Hand Dominance Right   Extremity/Trunk Assessment Upper Extremity Assessment Upper  Extremity Assessment: Generalized weakness    Lower Extremity Assessment Lower Extremity Assessment: Defer to PT evaluation   Cervical / Trunk Assessment Cervical / Trunk Assessment: Kyphotic   Communication Communication Communication: No difficulties   Cognition Arousal/Alertness: Awake/alert Behavior During Therapy: WFL for tasks assessed/performed Overall Cognitive Status: History of cognitive impairments - at baseline Area of Impairment: Safety/judgement;Awareness;Problem solving                         Safety/Judgement: Decreased awareness of safety;Decreased awareness of deficits Awareness: Intellectual Problem Solving: Slow processing;Requires verbal cues General Comments: patient with poor insight and ability to self monitor, reports at home when he started feeling better "I get up really quickly" resulting in dizziness and falls. when asked if he was using his rollator he states no.    General Comments  O2 maintained above 90% on 6L during session, requires cues for pursed lip breathing technique            Home Living Family/patient expects to be discharged to:: Private residence Living Arrangements: Alone Available Help at Discharge: Other (Comment)(neighbor picks up groceries, DTR visits less than monthly) Type of Home: Apartment Home Access: Stairs to enter Entrance Stairs-Number of Steps: 1 Entrance Stairs-Rails: None Home Layout: One level     Bathroom Shower/Tub: Corporate investment banker: Standard Bathroom Accessibility: Yes How Accessible: Accessible via walker Home Equipment: [place] - single point;Walker - 4 wheels      Lives With: Alone    Prior Functioning/Environment Level of Independence: Independent with assistive device(s)        Comments: uses a cane to ambulate, pt reports he also has a rollator, but uses cane the most        OT Problem List: Decreased activity tolerance;Impaired balance (sitting and/or standing);Decreased cognition;Decreased safety  awareness      OT Treatment/Interventions: Self-care/ADL training;Therapeutic exercise;Energy conservation;DME and/or AE instruction;Therapeutic activities;Cognitive remediation/compensation;Patient/family education;Balance training    OT Goals(Current goals can be found in the care plan section) Acute Rehab OT Goals Patient Stated Goal: to get stronger OT Goal Formulation: With patient Time For Goal Achievement: 12/05/19 Potential to Achieve Goals: Good  OT Frequency: Min 2X/week   Barriers to D/C: Decreased caregiver support          Co-evaluation PT/OT/SLP Co-Evaluation/Treatment: Yes Reason for Co-Treatment: Complexity of the patient's impairments (multi-system involvement);To address functional/ADL transfers;Necessary to address cognition/behavior during functional activity PT goals addressed during session: Mobility/safety with mobility;Balance;Proper use of DME OT goals addressed during session: ADL's and self-care      AM-PAC OT "6 Clicks" Daily Activity     Outcome Measure Help from another person eating meals?: None Help from another person taking care of personal grooming?: A Little Help from another person toileting, which includes using toliet, bedpan, or urinal?: A Little Help from another person bathing (including washing, rinsing, drying)?: A Lot Help from another person to put on and taking off regular upper body clothing?: A Little Help from another person to put on and taking off regular lower body clothing?: A Lot 6 Click Score: 17   End of Session Equipment Utilized During Treatment: Rolling walker;Oxygen  Activity Tolerance: Patient tolerated treatment well Patient left: in chair;with call bell/phone within reach  OT Visit Diagnosis: Other abnormalities of gait and mobility (R26.89);History of falling (Z91.81);Other symptoms and signs involving cognitive function                Time: 8099-8338 OT Time  Calculation (min): 29 min Charges:  OT General  Charges $OT Visit: 1 Visit OT Evaluation $OT Eval Moderate Complexity: [place] OT OT office: [place] 11/21/2019, 1:37 PM

## 2019-11-21 NOTE — Progress Notes (Signed)
Subjective:   Patient was seen this morning on rounds. He appears more drowsy today then on admission. He is able to wake up to verbal and physical stimuli and is oriented to person and place, but needs considerably more prompting to remain engaged. He is in no apparent distress saturating well on 4L [place].  Objective:  Vital signs in last 24 hours: Vitals:   11/21/19 0012 11/21/19 0115 11/21/19 0134 11/21/19 0542  BP:  (!) 163/99 (!) 170/83 (!) 178/104  Pulse:  (!) 54  67  Resp:  (!) 23  (!) 22  Temp: (!) 97.4 F (36.3 C)   98.6 F (37 C)  TempSrc: Rectal     SpO2:  100%  96%  Weight:    91.4 kg  Height:        Physical Exam Vitals and nursing note reviewed.  Constitutional:      Appearance: He is obese.     Interventions: Nasal cannula in place.     Comments: Very fatigued, needing prompting often  Cardiovascular:     Rate and Rhythm: Normal rate and regular rhythm.     Heart sounds: No murmur.  Pulmonary:     Effort: Pulmonary effort is normal. No respiratory distress.     Breath sounds: Wheezing and rales present.  Abdominal:     General: Bowel sounds are normal. There is no distension.     Palpations: Abdomen is soft.     Tenderness: There is no abdominal tenderness. There is no guarding.  Musculoskeletal:     Right lower leg: 2+ Edema present.     Left lower leg: 2+ Edema present.  Skin:    General: Skin is warm and dry.     Coloration: Skin is not pale.     Findings: No erythema.  Neurological:     General: No focal deficit present.     Mental Status: He is oriented to person, place, and time. He is lethargic.    Assessment/Plan:  Active Problems:   Acute exacerbation of congestive heart failure ([place])  # Acutely Decompensated HFrEF # Acute Hypoxic Respiratory Failure TTE on 11/20/19 showed EF of 25-30% with severe global reduction of LV with global hypokinesis, bi-atrial enlargement. This is a change from his prior TTE in August of 2020 that showed EF of  45-50%. I suspect this acute worsening may have been triggered by his crack cocaine use. Weight on CIR discharge (11/02/19) was 83.9 kg and he weighed 91.4 kg on admission yesterday, so 7.5 kg increase in 19 days. Moderate output overnight at 2.1L, net negative of 1.7. He would benefit from increase in Lasix, will titrate to TID.   - Telemetry  - Strict in/out  - Daily weights  - Fluid restricted diet 1500 mL  - BMP daily  - Lasix 80 mg TID - Bidil 20-37.5 mg BID   # Mitral Valve Mass TTE showed 1 x 1 cm oscillating density on the ventricular side of the mitral valve, possibly calcification, however thrombus vs vegetation would not be ruled out. Mild mitral regurg and no stenosis. No evidence of this on prior TTE in August. Blood cultures have been drawn but low suspicion for endocarditis.   - Cardiology consulted for further recommendations regarding mass   # CKD Stage 5  # Secondary Hyperparathyroidism  Etiology likely 2/2 to diabetic nephropathy per chart review. As of February 2021, new baseline felt to be ~5.9. Overnight, patient has developed hyperkalemia with a maximum of 5.8. Lokelma  was ordered last night but not administered as patient was being moved rooms. Lokelma was re-ordered for this morning. Elevated phosporus noted at 7.7; Phoslow started yesterday evening. Pt also has a metabolic acidosis that is mildly worsened as of yesterday with a bicarb of 17. Given acute changes in mental status, concerning for acute encephalopathy associated withy uremia, plan to consult nephrology for recommendations regarding initiating HD.   - Nephrology consulted - Renal function panel daily  - Lokelma 10 mg this AM - Phoslo 1334 mg TID   # Acute Encephalopathy  Significantly more lethargic this AM compared to last night but continues to be oriented x3. After bedside rounds, continued to be lethargic but was able to walk with PT. This is concerning for uremic encephalopathy. Differential also  includes 2/2 to infectious process vs hyponatremia, however low suspicion. This may also be delirium vs behavioral but will first investigate organic causes.   - Nephrology consulted   # 1st Degree AV Block  HR worsened overnight with administration of Coreg, which was a home medication continued on admission since patient endorses not missing any doses. Discontinued at this time and do not plan to restart at a later time. HR has improved to the 60-70s this AM.   - Telemetry - Avoid beta blockers   # Hypertension  Improved this AM after receiving Bidil and Amlodipine, although quite hypertensive overnight. Continue to monitor closely.   - Amlodipine 10 mg QD - Bidil 20-37.5 mg BID   # COPD  On Incruse at home, but per nursing note, ran out 2 weeks ago. No pulmonology follow up or PFTs on chart. Wheezing present on examination both yesterday and today. Received Prednisone but will discontinue as hypoxia more likely cardiogenic.   - Duoneb q6h PRN - Incruse QD   # Hypervolemic Hyponatremia  Mild hyponatremia with sodium of 125 today. Likely dilutional. Will continue to monitor.   - BMP in the AM  # Dysuria  UA without evidence of infection. Although patient denied sexual activity, will check G/C.   -G/C urine test pending   # T2DM  Last A1c of 8.5% in January 2021.   - SSI (moderate)  - Lantus 10 units at bedtime    # Normocytic Anemia  Iron studies in February did not show evidence of iron deficiency anemia. High suspicion this is due to anemia of chronic disease. Will continue to monitor.   - CBC in the AM  # Schizoaffective Disorder - Continue Zyprexa 5 mg at bedtime  - Continue Effexor 225 mg in the AM - Continue Trazodone 200 mg at bedtime   # Polysubstance Abuse  - TOC consult for resources when closer to discharge    Dispo: Anticipated discharge pending further medical evaluation.   Dr. Jose Persia Internal Medicine PGY-1  Pager:  [phone] 11/21/2019, 7:03 AM

## 2019-11-21 NOTE — Progress Notes (Signed)
Hypoglycemic Event  CBG: 69 Treatment: 221ml Orange Juic  Symptoms: none  Follow-up CBG: FSFS:[id number] CBG Result:79  Possible Reasons for Event: poor po intake  Comments/MD notified:NO    [place], [place]

## 2019-11-21 NOTE — Evaluation (Signed)
Physical Therapy Evaluation Patient Details Name: Barry Lewis MRN: [id number] DOB: [birth year] Today's Date: 11/21/2019   History of Present Illness  Pt is a 59 yo male presenting with shortness of breath x3 days and reports of a recent fall. Upon admission, pt dx with acute hypoxic resp failure due to CHF exacerbation. Pt PMH includes: CVA, schizoaffective disorder, polysubstance abuse, hyperlipidemia, COPD, CKD III, chronic CHF, and recent hospitalization after a fall-resulting in multiple rib fractures.  Clinical Impression  Pt in bed upon arrival of PT/OT, agreeable to evaluation at this time. Prior to admission the pt was independent with use of SPC for ambulation in his home, and received some assist from neighbors for IADLs like getting groceries. The pt now presents with limitations in functional mobility, endurance, dynamic stability due to above dx, and will continue to benefit from skilled PT to address these deficits. The pt's course is further complicated by cognitive deficits in the areas of safety awareness, problem-solving, and energy conservation that significantly impact his ability to safely self-manage mobility if he were to return home. The pt was able to demo ~100 ft of ambulation in the hallway today with a RW and was informed multiple times that he should rest if he needed, but did not stop until he was so fatigued he had to have a chair brought to him by RN staff and roll back to room.  Pt reported 8/10 fatigue following 133ft ambulation, VSS. Pt educated and agreeable, but unable to functionally implement self-management strategies. The pt would benefit from continued rehab to maximize endurance training and self-management strategies, but would also need supervision after d/c which he does not currently appear to have at home. Therefore, I recommend SNF rehab, but pt could progress to d/c home if supervision could be arranged.      Follow Up Recommendations  SNF;Supervision/Assistance - 24 hour(due to cog deficits seen today, pt requires supervision to be safe after d/c. If supervision can be arranged at home, pt could progress to that)    Equipment Recommendations  (pt has rollator)    Recommendations for Other Services       Precautions / Restrictions Precautions Precautions: Fall Precaution Booklet Issued: No Precaution Comments: Reviewed log roll for comfort due to fracture Restrictions Weight Bearing Restrictions: No      Mobility  Bed Mobility Overal bed mobility: Needs Assistance Bed Mobility: Supine to Sit     Supine to sit: Supervision     General bed mobility comments: pt able to come to EOB without assist, supervision for line management only  Transfers Overall transfer level: Needs assistance Equipment used: Rolling walker (2 wheeled) Transfers: Sit to/from Stand Sit to Stand: Min guard         General transfer comment: minG for safety, pt able to power up without physical assist. no LOB, VCs for hand placement  Ambulation/Gait Ambulation/Gait assistance: Min guard Gait Distance (Feet): 100 Feet Assistive device: Rolling walker (2 wheeled) Gait Pattern/deviations: Step-through pattern;Decreased stride length;Trunk flexed Gait velocity: decreased Gait velocity interpretation: <1.31 ft/sec, indicative of household ambulator General Gait Details: pt with slow but steady ambulation, pt reports progressive fatigue but did not take any standing or seated rest breaks until he was so fatigued he could no longer walk back to his room and required RN to bring chair to him in hallway, this demos some of limitations in safety awareness and energy conservation deficits. Pt educated and agreeable, but unable to functionally implement.  Stairs  Wheelchair Mobility    Modified Rankin (Stroke Patients Only)       Balance Overall balance assessment: Needs assistance Sitting-balance support: No upper  extremity supported;Feet supported Sitting balance-Leahy Scale: Fair Sitting balance - Comments: supervision     Standing balance-Leahy Scale: Fair Standing balance comment: able to static stand without UE support, reliant on BUE support for ambulation                             Pertinent Vitals/Pain Pain Assessment: No/denies pain    Home Living Family/patient expects to be discharged to:: Private residence Living Arrangements: Alone Available Help at Discharge: Other (Comment)(neighbor picks up groceries monthly, daughter visits less than monthly) Type of Home: Apartment Home Access: Stairs to enter Entrance Stairs-Rails: None Entrance Stairs-Number of Steps: 1 Home Layout: One level Home Equipment: Cane - single point      Prior Function Level of Independence: Independent         Comments: uses a cane to ambulate, pt reports he also has a rollator, but uses cane the most     Hand Dominance   Dominant Hand: Right    Extremity/Trunk Assessment   Upper Extremity Assessment Upper Extremity Assessment: Defer to OT evaluation    Lower Extremity Assessment Lower Extremity Assessment: Overall WFL for tasks assessed    Cervical / Trunk Assessment Cervical / Trunk Assessment: Kyphotic  Communication   Communication: No difficulties  Cognition Arousal/Alertness: Awake/alert Behavior During Therapy: WFL for tasks assessed/performed Overall Cognitive Status: History of cognitive impairments - at baseline                                 General Comments: pt with sig cog deficits demoed during today's session, limited problem-solving and safety awareness are most concerning given pt reports he currently lives alone.      General Comments      Exercises     Assessment/Plan    PT Assessment Patient needs continued PT services  PT Problem List Decreased strength;Decreased mobility;Decreased safety awareness;Decreased  coordination;Decreased activity tolerance;Cardiopulmonary status limiting activity;Decreased balance       PT Treatment Interventions DME instruction;Gait training;Therapeutic exercise;Stair training;Functional mobility training;Therapeutic activities;Balance training;Cognitive remediation;Patient/family education    PT Goals (Current goals can be found in the Care Plan section)  Acute Rehab PT Goals Patient Stated Goal: to get stronger PT Goal Formulation: With patient Time For Goal Achievement: 12/05/19 Potential to Achieve Goals: Good    Frequency Min 3X/week   Barriers to discharge Decreased caregiver support pt living home alone with some support from [place] (for groceries)    Co-evaluation PT/OT/SLP Co-Evaluation/Treatment: Yes Reason for Co-Treatment: Necessary to address cognition/behavior during functional activity;For patient/therapist safety;Complexity of the patient's impairments (multi-system involvement) PT goals addressed during session: Mobility/safety with mobility;Balance;Proper use of DME         AM-PAC PT "6 Clicks" Mobility  Outcome Measure Help needed turning from your back to your side while in a flat bed without using bedrails?: A Little Help needed moving from lying on your back to sitting on the side of a flat bed without using bedrails?: A Little Help needed moving to and from a bed to a chair (including a wheelchair)?: A Little Help needed standing up from a chair using your arms (e.g., wheelchair or bedside chair)?: A Little Help needed to walk in hospital room?: A Little Help needed  climbing 3-5 steps with a railing? : A Lot 6 Click Score: 17    End of Session Equipment Utilized During Treatment: Gait belt;Oxygen(6L O2 [place]) Activity Tolerance: Patient tolerated treatment well;Patient limited by fatigue Patient left: in chair;with call bell/phone within reach Nurse Communication: Mobility status PT Visit Diagnosis: Difficulty in walking, not  elsewhere classified (R26.2);History of falling (Z91.81)    Time: 1102-1130 PT Time Calculation (min) (ACUTE ONLY): 28 min   Charges:   PT Evaluation $PT Eval Moderate Complexity: 1 Mod          Karma Ganja, PT, DPT   Acute Rehabilitation Department Pager #: [phone]  Otho Bellows 11/21/2019, 1:07 PM

## 2019-11-21 NOTE — Consult Note (Addendum)
Subjective/hpi   [name] is a 59 year old male with DM, COPD, CHF with EF 45-50%, history of CVA, schizoaffective d/o.  Had a recent hosp for fall resulting in fx/rehab.  Also has history of CKD-  pretty advanced- req transient HD during hosp at [facility] in the fall of 2020, left with crt in the 5's and 6's.  This was again noted  during most recent hosp.  He had been followed in the past by nephrology in [place] possibly.  Was not felt to be uremic last month- had vein mapping but not started on HD.  He now is hospitalized for volume overload but c/o are notable for extreme fatigue and SOB  Mr. Rote is resting upright in the chair asleep on arrival. During exam and interview patient is pleasant and cooperative. Work of breathing increased with short responses to questioning. He endorses improvement in SOB from ED presentation. He admits CP with inspiration due to broken ribs from previous and multiple falls. His LE edema has not improved since last admission 3 weeks ago, despite reported medication adherence. Patient states extreme fatigue and weakness since discharge. He has experienced increased urinary frequency, urgency and incontinence. Endorses dysuria with each void. When he feels the sensation to urinate he is aware that he has to move quickly to avoid incontinence, which is attributed to his falls. He admits increase work of breathing and SOB with minor effort at home. He is unable to complete ADL's at the level he would like due to weakness and fatigue. He uses crack cocaine every two weeks to increase his energy level to accomplish house maintence.   He has been experiencing some abdominal discomfort at home and has had some episode of emesis over the past several days. He maintains a normal appetitive, though states he does not cook as often as he could 2 months ago. He states some diarrhea and constipation in cycles, though the timeline is unclear. He admits to dark red blood in stool  every couple of days.  Objective Physical Exam  Constitutional: He is oriented to person, place, and time. He appears lethargic. He appears unhealthy. He appears distressed.  Cardiovascular: Normal rate and regular rhythm. Exam reveals distant heart sounds.  Pulmonary/Chest: Tachypnea noted. He is in respiratory distress. He has rales in the right lower field and the left lower field.  Abdominal: Soft. Bowel sounds are normal.  Musculoskeletal:        General: Edema present.     Right upper leg: Edema (+1) present.     Left upper leg: Edema (+1) present.     Right lower leg: Edema (2+) present.     Left lower leg: Edema (2+) present.  Neurological: He is oriented to person, place, and time. He appears lethargic.  +asterixis  Skin: Skin is warm and dry.    Intake/Output Summary (Last 24 hours) at 11/21/2019 1440 Last data filed at 11/21/2019 0900 Gross per 24 hour  Intake 650 ml  Output 1600 ml  Net -950 ml    Vitals with BMI 11/21/2019 11/21/2019 11/21/2019  Height - - -  Weight - - -  BMI - - -  Systolic 174 944 -  Diastolic 89 84 -  Pulse 65 63 63   Results for orders placed or performed during the hospital encounter of 11/20/19 (from the past 24 hour(s))  Urinalysis, Routine w reflex microscopic     Status: Abnormal   Collection Time: 11/20/19  3:08 PM  Result  Value Ref Range   Color, Urine STRAW (A) YELLOW   APPearance CLEAR CLEAR   Specific Gravity, Urine 1.006 1.005 - 1.030   pH 7.0 5.0 - 8.0   Glucose, UA 50 (A) NEGATIVE mg/dL   Hgb urine dipstick NEGATIVE NEGATIVE   Bilirubin Urine NEGATIVE NEGATIVE   Ketones, ur NEGATIVE NEGATIVE mg/dL   Protein, ur 100 (A) NEGATIVE mg/dL   Nitrite NEGATIVE NEGATIVE   Leukocytes,Ua NEGATIVE NEGATIVE   RBC / HPF 0-5 0 - 5 RBC/hpf   WBC, UA 0-5 0 - 5 WBC/hpf   Bacteria, UA RARE (A) NONE SEEN  CBG monitoring, ED     Status: Abnormal   Collection Time: 11/20/19  5:03 PM  Result Value Ref Range   Glucose-Capillary 120 (H) 70 - 99  mg/dL   Comment 1 Notify RN    Comment 2 Document in Chart   Culture, blood (routine x 2)     Status: None (Preliminary result)   Collection Time: 11/20/19  6:20 PM   Specimen: BLOOD RIGHT ARM  Result Value Ref Range   Specimen Description BLOOD RIGHT ARM    Special Requests      BOTTLES DRAWN AEROBIC AND ANAEROBIC Blood Culture adequate volume   Culture      NO GROWTH < 24 HOURS Performed at [facility], [address], [place], [place] [address]    Report Status PENDING   Basic metabolic panel     Status: Abnormal   Collection Time: 11/20/19  6:25 PM  Result Value Ref Range   Sodium 121 (L) 135 - 145 mmol/L   Potassium 5.8 (H) 3.5 - 5.1 mmol/L   Chloride 93 (L) 98 - 111 mmol/L   CO2 16 (L) 22 - 32 mmol/L   Glucose, Bld 157 (H) 70 - 99 mg/dL   BUN 63 (H) 6 - 20 mg/dL   Creatinine, Ser 5.74 (H) 0.61 - 1.24 mg/dL   Calcium 7.4 (L) 8.9 - 10.3 mg/dL   GFR calc non Af Amer 10 (L) >60 mL/min   GFR calc Af Amer 12 (L) >60 mL/min   Anion gap 12 5 - 15  Culture, blood (routine x 2)     Status: None (Preliminary result)   Collection Time: 11/20/19  6:25 PM   Specimen: BLOOD LEFT ARM  Result Value Ref Range   Specimen Description BLOOD LEFT ARM    Special Requests      BOTTLES DRAWN AEROBIC ONLY Blood Culture adequate volume   Culture      NO GROWTH < 24 HOURS Performed at [facility], [address], [place], [place] [address]    Report Status PENDING   Glucose, capillary     Status: Abnormal   Collection Time: 11/20/19 10:24 PM  Result Value Ref Range   Glucose-Capillary 223 (H) 70 - 99 mg/dL  CBC     Status: Abnormal   Collection Time: 11/21/19  3:59 AM  Result Value Ref Range   WBC 7.1 4.0 - 10.5 K/uL   RBC 2.81 (L) 4.22 - 5.81 MIL/uL   Hemoglobin 8.9 (L) 13.0 - 17.0 g/dL   HCT 26.6 (L) 39.0 - 52.0 %   MCV 94.7 80.0 - 100.0 fL   MCH 31.7 26.0 - 34.0 pg   MCHC 33.5 30.0 - 36.0 g/dL   RDW 12.7 11.5 - 15.5 %   Platelets 228 150 - 400 K/uL   nRBC 0.0 0.0 -  0.2 %  Magnesium  Status: None   Collection Time: 11/21/19  3:59 AM  Result Value Ref Range   Magnesium 2.1 1.7 - 2.4 mg/dL  Lipid panel     Status: None   Collection Time: 11/21/19  3:59 AM  Result Value Ref Range   Cholesterol 164 0 - 200 mg/dL   Triglycerides 108 <150 mg/dL   HDL 55 >40 mg/dL   Total CHOL/HDL Ratio 3.0 RATIO   VLDL 22 0 - 40 mg/dL   LDL Cholesterol 87 0 - 99 mg/dL  Renal function panel     Status: Abnormal   Collection Time: 11/21/19  3:59 AM  Result Value Ref Range   Sodium 125 (L) 135 - 145 mmol/L   Potassium 5.6 (H) 3.5 - 5.1 mmol/L   Chloride 94 (L) 98 - 111 mmol/L   CO2 17 (L) 22 - 32 mmol/L   Glucose, Bld 97 70 - 99 mg/dL   BUN 67 (H) 6 - 20 mg/dL   Creatinine, Ser 5.76 (H) 0.61 - 1.24 mg/dL   Calcium 7.9 (L) 8.9 - 10.3 mg/dL   Phosphorus 7.7 (H) 2.5 - 4.6 mg/dL   Albumin 2.5 (L) 3.5 - 5.0 g/dL   GFR calc non Af Amer 10 (L) >60 mL/min   GFR calc Af Amer 12 (L) >60 mL/min   Anion gap 14 5 - 15  Glucose, capillary     Status: Abnormal   Collection Time: 11/21/19  6:30 AM  Result Value Ref Range   Glucose-Capillary 69 (L) 70 - 99 mg/dL  Glucose, capillary     Status: None   Collection Time: 11/21/19  6:46 AM  Result Value Ref Range   Glucose-Capillary 79 70 - 99 mg/dL  Glucose, capillary     Status: None   Collection Time: 11/21/19  6:53 AM  Result Value Ref Range   Glucose-Capillary 74 70 - 99 mg/dL  Glucose, capillary     Status: Abnormal   Collection Time: 11/21/19 11:39 AM  Result Value Ref Range   Glucose-Capillary 209 (H) 70 - 99 mg/dL   Comment 1 Notify RN    Comment 2 Document in Chart    Assessment & Plan  #AKI/CKD stage V Previous admission 10/15/19-11/03/19 (10/18/19>>rehab) with 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 Medication compliance is questionable given unchanged state of health and failure to thrive since discharge from an extended stage.  Kidney disease continuing to decline to ESRD. Patient  would likely benefit from HD  -scr: 5.76 (11/21/19) baseline based on previous admission of upper 5's -access: currently 2 peripheral IV>> R EJ 18G and L forearm 20G            >>previous vein mapping done on 2/7 in anticipation of AVF -consider HD with physical exam findings of asterixis and subjective report of emesis and increasing fatigue -metabolic acidosis with bicarb of 17 -consider initiation of IV bicarb if patient not amiable to HD.  #Uremia  BUN uptrending and +signs/symptoms of nausea, emesis, severe fatigue and asterixis 61 (3/8)>>63>>67 (3/9), though these lab findings not as severe as last admission, he is now symptomatic. -consider HD  #Hyperkalemia 5.8 (3/8)>>5.6 (3/9) -Lokelma 10mg  initiated 11/21/19 -renal diet  -consider HD  #HTN/volume status Patient with poorly controlled hypertension due to unknown compliance of home medications which consist of: amlodipine 10 mg, carvedilol 12.5mg  -continue amlodipine 10mg  -continue lasix 80mg  TID  -strict I&O's   #Anemia of chronic disease -Hgb 8.9 -monitor daily CBC -maintain Hbg>7.0  -given  decline in kidney function, consider ESA  #Metabolic bone disease Corrected Ca 9.1/phos 7.7 (3/9) -phoslo 1334mg  initiated  -PTH evaluation when HD started  Pt seen and examined agree with note above.  59 year old with multiple medical problems including CHF with dec EF and CKD-  Advanced-  Required HD during past hospitalization about 6 mos ago.  Noted to have advanced CKD during hosp last month- discharged on 2/19.  Presented to hosp yest with edema, sob and really failure to thrive due to fatigue.  When you ask him-  He also c/o nausea and appears to have asterixis.  Labs show BUN in the 60's and crt in the 5's- GFR in the low teens.  He also has anemia, metabolic acidosis and hyperkalemia.  As above he is very volume overloaded and anemic as well.  I have d/w him.  I think the absolute best way to get him better the fastest would be  to initiate dialysis .  He wants to talk it over with his sister in law- may work at a dialysis center ?? If agrees will start.  Regarding anemia will check iron stores and give ESA.  Regarding edema-  Have increased lasix.

## 2019-11-22 ENCOUNTER — Inpatient Hospital Stay (HOSPITAL_COMMUNITY): Payer: Medicare Other

## 2019-11-22 ENCOUNTER — Encounter (HOSPITAL_COMMUNITY): Payer: Self-pay | Admitting: Internal Medicine

## 2019-11-22 DIAGNOSIS — N186 End stage renal disease: Secondary | ICD-10-CM

## 2019-11-22 HISTORY — PX: IR FLUORO GUIDE CV LINE RIGHT: IMG2283

## 2019-11-22 HISTORY — PX: IR US GUIDE VASC ACCESS RIGHT: IMG2390

## 2019-11-22 LAB — CBC WITH DIFFERENTIAL/PLATELET
Abs Immature Granulocytes: 0.02 10*3/uL (ref 0.00–0.07)
Basophils Absolute: 0 10*3/uL (ref 0.0–0.1)
Basophils Relative: 0 %
Eosinophils Absolute: 0.1 10*3/uL (ref 0.0–0.5)
Eosinophils Relative: 2 %
HCT: 22.9 % — ABNORMAL LOW (ref 39.0–52.0)
Hemoglobin: 7.7 g/dL — ABNORMAL LOW (ref 13.0–17.0)
Immature Granulocytes: 0 %
Lymphocytes Relative: 24 %
Lymphs Abs: 1.5 10*3/uL (ref 0.7–4.0)
MCH: 31.6 pg (ref 26.0–34.0)
MCHC: 33.6 g/dL (ref 30.0–36.0)
MCV: 93.9 fL (ref 80.0–100.0)
Monocytes Absolute: 0.4 10*3/uL (ref 0.1–1.0)
Monocytes Relative: 7 %
Neutro Abs: 4.2 10*3/uL (ref 1.7–7.7)
Neutrophils Relative %: 67 %
Platelets: 207 10*3/uL (ref 150–400)
RBC: 2.44 MIL/uL — ABNORMAL LOW (ref 4.22–5.81)
RDW: 12.7 % (ref 11.5–15.5)
WBC: 6.2 10*3/uL (ref 4.0–10.5)
nRBC: 0 % (ref 0.0–0.2)

## 2019-11-22 LAB — GLUCOSE, CAPILLARY
Glucose-Capillary: 115 mg/dL — ABNORMAL HIGH (ref 70–99)
Glucose-Capillary: 100 mg/dL — ABNORMAL HIGH (ref 70–99)
Glucose-Capillary: 86 mg/dL (ref 70–99)
Glucose-Capillary: 81 mg/dL (ref 70–99)
Glucose-Capillary: 138 mg/dL — ABNORMAL HIGH (ref 70–99)

## 2019-11-22 LAB — RENAL FUNCTION PANEL
Albumin: 2.2 g/dL — ABNORMAL LOW (ref 3.5–5.0)
Anion gap: 13 (ref 5–15)
BUN: 77 mg/dL — ABNORMAL HIGH (ref 6–20)
CO2: 20 mmol/L — ABNORMAL LOW (ref 22–32)
Calcium: 7.5 mg/dL — ABNORMAL LOW (ref 8.9–10.3)
Chloride: 94 mmol/L — ABNORMAL LOW (ref 98–111)
Creatinine, Ser: 6.13 mg/dL — ABNORMAL HIGH (ref 0.61–1.24)
GFR calc Af Amer: 11 mL/min — ABNORMAL LOW (ref >60)
GFR calc non Af Amer: 9 mL/min — ABNORMAL LOW (ref >60)
Glucose, Bld: 102 mg/dL — ABNORMAL HIGH (ref 70–99)
Phosphorus: 6.3 mg/dL — ABNORMAL HIGH (ref 2.5–4.6)
Potassium: 4.8 mmol/L (ref 3.5–5.1)
Sodium: 127 mmol/L — ABNORMAL LOW (ref 135–145)

## 2019-11-22 LAB — IRON AND TIBC
Iron: 88 ug/dL (ref 45–182)
Saturation Ratios: 39 % (ref 17.9–39.5)
TIBC: 227 ug/dL — ABNORMAL LOW (ref 250–450)
UIBC: 139 ug/dL

## 2019-11-22 LAB — GC/CHLAMYDIA PROBE AMP (~~LOC~~) NOT AT ARMC
Chlamydia: NEGATIVE
Comment: NEGATIVE
Comment: NORMAL
Neisseria Gonorrhea: NEGATIVE

## 2019-11-22 LAB — FERRITIN: Ferritin: 198 ng/mL (ref 24–336)

## 2019-11-22 MED ORDER — INSULIN ASPART 100 UNIT/ML ~~LOC~~ SOLN
0.0000 [IU] | Freq: Three times a day (TID) | SUBCUTANEOUS | Status: DC
  Administered 2019-11-23 (×2): 3 [IU] via SUBCUTANEOUS
  Administered 2019-11-24 – 2019-11-25 (×2): 5 [IU] via SUBCUTANEOUS
  Administered 2019-11-25: 3 [IU] via SUBCUTANEOUS
  Administered 2019-11-25: 5 [IU] via SUBCUTANEOUS
  Administered 2019-11-26: 8 [IU] via SUBCUTANEOUS
  Administered 2019-11-26: 3 [IU] via SUBCUTANEOUS
  Administered 2019-11-27: 2 [IU] via SUBCUTANEOUS
  Administered 2019-11-27: 5 [IU] via SUBCUTANEOUS
  Administered 2019-11-27 – 2019-11-28 (×2): 3 [IU] via SUBCUTANEOUS
  Administered 2019-11-29: 5 [IU] via SUBCUTANEOUS
  Administered 2019-11-29: 2 [IU] via SUBCUTANEOUS

## 2019-11-22 MED ORDER — CHLORHEXIDINE GLUCONATE CLOTH 2 % EX PADS
6.0000 | MEDICATED_PAD | Freq: Every day | CUTANEOUS | Status: DC
  Administered 2019-11-23 – 2019-11-29 (×7): 6 via TOPICAL

## 2019-11-22 MED ORDER — MIDAZOLAM HCL 2 MG/2ML IJ SOLN
INTRAMUSCULAR | Status: AC
  Filled 2019-11-22: qty 2

## 2019-11-22 MED ORDER — MIDAZOLAM HCL 2 MG/2ML IJ SOLN
INTRAMUSCULAR | Status: AC | PRN
  Administered 2019-11-22: 0.5 mg via INTRAVENOUS

## 2019-11-22 MED ORDER — CEFAZOLIN SODIUM-DEXTROSE 2-4 GM/100ML-% IV SOLN
2.0000 g | Freq: Once | INTRAVENOUS | Status: AC
  Administered 2019-11-22: 2 g via INTRAVENOUS

## 2019-11-22 MED ORDER — CEFAZOLIN SODIUM-DEXTROSE 2-4 GM/100ML-% IV SOLN
INTRAVENOUS | Status: AC
  Filled 2019-11-22: qty 100

## 2019-11-22 MED ORDER — HEPARIN SODIUM (PORCINE) 1000 UNIT/ML IJ SOLN
INTRAMUSCULAR | Status: AC
  Filled 2019-11-22: qty 1

## 2019-11-22 MED ORDER — FENTANYL CITRATE (PF) 100 MCG/2ML IJ SOLN
INTRAMUSCULAR | Status: AC | PRN
  Administered 2019-11-22: 25 ug via INTRAVENOUS

## 2019-11-22 MED ORDER — FENTANYL CITRATE (PF) 100 MCG/2ML IJ SOLN
INTRAMUSCULAR | Status: AC
  Filled 2019-11-22: qty 2

## 2019-11-22 MED ORDER — GELATIN ABSORBABLE 12-7 MM EX MISC
CUTANEOUS | Status: AC
  Filled 2019-11-22: qty 1

## 2019-11-22 MED ORDER — LIDOCAINE HCL 1 % IJ SOLN
INTRAMUSCULAR | Status: AC
  Filled 2019-11-22: qty 20

## 2019-11-22 MED ORDER — LIDOCAINE HCL (PF) 1 % IJ SOLN
INTRAMUSCULAR | Status: AC | PRN
  Administered 2019-11-22: 5 mL

## 2019-11-22 NOTE — NC FL2 (Signed)
[place] MEDICAID FL2 LEVEL OF CARE SCREENING TOOL     IDENTIFICATION  Patient Name: Barry Lewis Birthdate: [birth year] Sex: male Admission Date (Current Location): 11/20/2019  [facility] and [place] Number:  Herbalist and Address:  [name], [place] [address] [place], [place] [address]      Provider Number: [id number]  Attending Physician Name and Address:  Velna Ochs, MD  Relative Name and Phone Number:  Clementeen Graham [phone]    Current Level of Care: Hospital Recommended Level of Care: [place] Prior Approval Number:    Date Approved/Denied:   PASRR Number: [id number]  Discharge Plan: SNF    Current Diagnoses: Patient Active Problem List   Diagnosis Date Noted  . ESRD (end stage renal disease) ([place])   . Renal insufficiency   . Encephalopathy   . Cardiac mass   . Acute exacerbation of congestive heart failure ([place]) 11/20/2019  . Left scapula fracture 10/22/2019  . Chronic combined systolic and diastolic CHF (congestive heart failure) ([place])   . Multiple closed fractures of ribs of both sides   . Closed nondisplaced fracture of seventh cervical vertebra ([place])   . Compression fracture of T3 vertebra (HCC)   . Hyperglycemia   . Multiple trauma   . Type 2 diabetes mellitus with polyneuropathy ([place])   . Diabetic ulcer of right midfoot associated with diabetes mellitus due to underlying condition, limited to breakdown of skin ([place])   . Severe protein-calorie malnutrition ([place])   . Rhabdomyolysis 10/13/2019  . Fall at home, initial encounter 10/13/2019  . Hypertension   . Dyslipidemia   . Diabetes mellitus without complication ([place])   . Dementia ([place])   . COPD (chronic obstructive pulmonary disease) ([place])   . CKD stage 4 secondary to hypertension ([place])   . Chronic diastolic (congestive) heart failure (HCC)     Orientation RESPIRATION BLADDER Height & Weight     Self, Time, Situation, Place  O2(nasal  cannula 3L/min) Incontinent, External catheter Weight: 196 lb 13.9 oz (89.3 kg) Height:  5\' 6"  (167.6 cm)  BEHAVIORAL SYMPTOMS/MOOD NEUROLOGICAL BOWEL NUTRITION STATUS      Continent Diet(see discharge)  AMBULATORY STATUS COMMUNICATION OF NEEDS Skin   Extensive Assist Verbally Other (Comment), Skin abrasions(abrasion knee and leg, cracking right foot, excoriated knees, MASD buttocks, skin tear kneeds)                       Personal Care Assistance Level of Assistance  Bathing, Feeding, Dressing, Total care Bathing Assistance: Limited assistance Feeding assistance: Independent Dressing Assistance: Limited assistance Total Care Assistance: Maximum assistance   Functional Limitations Info  Sight, Speech, Hearing Sight Info: Adequate Hearing Info: Adequate Speech Info: Adequate    SPECIAL CARE FACTORS FREQUENCY  PT (By licensed PT), OT (By licensed OT)     PT Frequency: min 5x weekly OT Frequency: min 5x weekly            Contractures Contractures Info: Not present    Additional Factors Info  Code Status, Allergies Code Status Info: full Allergies Info: Lithium, Ace Inhibitors, Angiotensin receptor blockers, ibuprofen, invega (paliperidone), prozac (fluoxetine Hcl)           Current Medications (11/22/2019):  This is the current hospital active medication list Current Facility-Administered Medications  Medication Dose Route Frequency Provider Last Rate Last Admin  . acetaminophen (TYLENOL) tablet 650 mg  650 mg Oral Q6H PRN Welford Roche, MD   650 mg at  11/20/19 2303   Or  . acetaminophen (TYLENOL) suppository 650 mg  650 mg Rectal Q6H PRN Santos-Sanchez, Merlene Morse, MD      . calcium acetate (PHOSLO) capsule 1,334 mg  1,334 mg Oral TID WC Welford Roche, MD   Stopped at 11/22/19 0818  . carbamazepine (TEGRETOL XR) 12 hr tablet 600 mg  600 mg Oral QHS Santos-Sanchez, Idalys, MD   600 mg at 11/21/19 2249  . Darbepoetin Alfa (ARANESP) injection 150 mcg   150 mcg Subcutaneous Q Wed-1800 Corliss Parish, MD      . ferrous sulfate tablet 325 mg  325 mg Oral Q breakfast Welford Roche, MD   325 mg at 11/22/19 0819  . fluticasone (FLONASE) 50 MCG/ACT nasal spray 2 spray  2 spray Each Nare Daily PRN Santos-Sanchez, Idalys, MD      . fluticasone furoate-vilanterol (BREO ELLIPTA) 100-25 MCG/INH 1 puff  1 puff Inhalation Daily Welford Roche, MD   1 puff at 11/22/19 0840  . folic acid (FOLVITE) tablet 1 mg  1 mg Oral Daily Santos-Sanchez, Idalys, MD   1 mg at 11/22/19 0819  . furosemide (LASIX) 160 mg in dextrose 5 % 50 mL IVPB  160 mg Intravenous TID Corliss Parish, MD 66 mL/hr at 11/22/19 0938 160 mg at 11/22/19 0938  . gabapentin (NEURONTIN) tablet 300 mg  300 mg Oral QHS Santos-Sanchez, Idalys, MD   300 mg at 11/21/19 2232  . heparin injection 5,000 Units  5,000 Units Subcutaneous Q8H Welford Roche, MD   Stopped at 11/22/19 0700  . insulin aspart (novoLOG) injection 0-15 Units  0-15 Units Subcutaneous TID WC Guilloud, Hoyle Sauer, MD      . insulin aspart (novoLOG) injection 0-5 Units  0-5 Units Subcutaneous QHS Welford Roche, MD   2 Units at 11/20/19 2248  . insulin glargine (LANTUS) injection 10 Units  10 Units Subcutaneous QHS Welford Roche, MD   10 Units at 11/21/19 2249  . ipratropium-albuterol (DUONEB) 0.5-2.5 (3) MG/3ML nebulizer solution 3 mL  3 mL Nebulization Q6H PRN Jose Persia, MD      . isosorbide-hydrALAZINE (BIDIL) 20-37.5 MG per tablet 1 tablet  1 tablet Oral TID Ina Homes, MD   1 tablet at 11/22/19 0819  . OLANZapine (ZYPREXA) tablet 5 mg  5 mg Oral QHS Welford Roche, MD   5 mg at 11/21/19 2232  . sodium chloride flush (NS) 0.9 % injection 3 mL  3 mL Intravenous Q12H Welford Roche, MD   3 mL at 11/21/19 2233  . traZODone (DESYREL) tablet 200 mg  200 mg Oral QHS Santos-Sanchez, Idalys, MD   200 mg at 11/21/19 2232  . umeclidinium bromide (INCRUSE ELLIPTA) 62.5  MCG/INH 1 puff  1 puff Inhalation Daily Velna Ochs, MD   1 puff at 11/21/19 0911  . venlafaxine XR (EFFEXOR-XR) 24 hr capsule 150 mg  150 mg Oral Q breakfast Welford Roche, MD   Stopped at 11/22/19 [phone]     Discharge Medications: Please see discharge summary for a list of discharge medications.  Relevant Imaging Results:  Relevant Lab Results:   Additional Information SSN: [id number]  Alberteen Sam, LCSW

## 2019-11-22 NOTE — Progress Notes (Addendum)
Subjective:   Barry Lewis reports that he is doing well this morning. His breathing is doing better but still feels groggy as he usually wakes up at 10AM. His lower extremity swelling has also improved. He has decided to move forward with dialysis, stating he wants to live.  All his questions and concerns were appropriately addressed.   Objective:  Vital signs in last 24 hours: Vitals:   11/22/19 0030 11/22/19 0210 11/22/19 0424 11/22/19 0500  BP: 132/71  126/66   Pulse: 67  65   Resp: 16  20   Temp: 98.2 F (36.8 C)  98.6 F (37 C)   TempSrc: Oral     SpO2: 98% 100% 100% 100%  Weight:   89.3 kg   Height:        Physical Exam Vitals and nursing note reviewed.  Constitutional:      General: He is not in acute distress.    Appearance: He is obese.  Cardiovascular:     Rate and Rhythm: Normal rate.     Heart sounds: No murmur.  Pulmonary:     Effort: Pulmonary effort is normal. No respiratory distress.     Breath sounds: Wheezing present. No rales.  Musculoskeletal:        General: No swelling.     Right lower leg: 1+ Pitting Edema present.     Left lower leg: 1+ Pitting Edema present.  Skin:    General: Skin is warm and dry.  Neurological:     General: No focal deficit present.     Mental Status: He is easily aroused. He is lethargic.     Comments: Improved alertness this AM, answering questions appropriately. Minimal asterixis  Psychiatric:        Behavior: Behavior is cooperative.    Assessment/Plan:  Active Problems:   Acute exacerbation of congestive heart failure (HCC)   Renal insufficiency   Encephalopathy   Cardiac mass  # Acutely Decompensated HFrEF # Acute Hypoxic Respiratory Failure TTE on 11/20/19 showed EF of 25-30% with severe global reduction of LV with global hypokinesis, bi-atrial enlargement. This is a change from his prior TTE in August of 2020 that showed EF of 45-50%. I suspect this acute worsening may have been triggered by his crack  cocaine use. Weight on 2/18 was 83.9 kg; on admission on 3/8, it was 91.1 kg.   Patient was started on Lasix 80 mg TID yesterday. Per Nephrology, increased to Lasix 160 mg TID to begin today. He diuresed nearly 3 L yesterday, with a net negative of 2.2L. He is down 5 lbs since yesterday AM.   Cardiology has recommended Toprol for HFrEF, however due to 1st AV block, will defer at this time.   - Telemetry  - Strict in/out  - Daily weights  - Fluid restricted diet 1500 mL  - BMP daily  - Lasix 160 mg TID - Bidil 20-37.5 mg BID   # Mitral Valve Mass TTE showed 1 x 1 cm oscillating density on the ventricular side of the mitral valve, possibly calcification, however thrombus vs vegetation would not be ruled out. Mild mitral regurg and no stenosis. No evidence of this on prior TTE in August. Blood cultures have been drawn but low suspicion for endocarditis. Cardiology evaluated and feel this is likely detached calcification.   - No further interventions   # CKD Stage5 # Secondary Hyperparathyroidism # Uremic Encephalopathy  Etiology likely 2/2 to diabetic nephropathy per chart review. Electrolyte status improved today with  decreases in hyperkalemia and hyperphosphatemia and increase in bicarb. Continues to be lethargic but improved compared to yesterday. Nephrology has discussed dialysis with the patient. This AM, he expressed his desire to move forward with dialysis.   - Nephrology following; appreciate their recommendations  - IR fluoro ordered - Renal function panel daily  - Phoslo 1334 mg TID   # 1st Degree AV Block  HR continues to be stable in the 60-70s.    - Telemetry - Avoid beta blockers   # Hypertension Significantly improved. No medication changes planned today, as I expect further decreases with HD and continued diuresis.  - Amlodipine 10 mg QD - Bidil 20-37.5 mg BID   # COPD  Improved wheezing on examination today.   - Duoneb q6h PRN - Incruse QD    #HypervolemicHyponatremia Improving.   - BMP in the AM  # T2DM  Last A1c of 8.5% in January 2021.   - SSI (moderate)  - Lantus 10 units at bedtime   # Normocytic Anemia Iron studies in February did not show evidence of iron deficiency anemia. High suspicion this is due to anemia of chronic disease. Nephrology initiating ESA.   - CBC in the AM - Aranesp per Nephro  # Dysuria  - G/C urine test pending   # Schizoaffective Disorder - Continue Zyprexa 5 mgat bedtime  - Continue Effexor 225 mg in the AM - Continue Trazodone 200 mg at bedtime  # Polysubstance Abuse  - TOC consult for resources when closer to discharge    Dispo: Anticipated discharge pending further medical evaluation.   Dr. Jose Persia Internal Medicine PGY-1  Pager: [phone] 11/22/2019, 6:43 AM

## 2019-11-22 NOTE — Progress Notes (Addendum)
Subjective This morning Barry Lewis is feeling well and had no overnight events. He is much more alert and awake during the interview than previous discussions. He endorses improvement in his work of breathing and was lying flat in bed upon entering the room.He believes that his edema is improved from admission. He is amiable to beginning HD after a long conversation with his sister-in-law last night about the process. He endorses continued dysuria most painful upon initiation though continues throughout his flow and denies hematuria. He denies CP, palpitations, NVD. He has not had a bowel movement since Monday, does not have any abdominal pain.  When I see him he is still waiting for IR to place [facility]-  He has accepted his fate of HD- diuresed 2.2 liters with lasix  Objective Physical Exam  Constitutional: He is oriented to person, place, and time. He appears well-developed and well-nourished. He is cooperative. He is easily aroused. Nasal cannula in place.  Cardiovascular: Normal rate and regular rhythm. Exam reveals distant heart sounds.  Respiratory: Tachypnea noted. No respiratory distress. He has no wheezes. He has no rhonchi. He has rales in the right lower field and the left lower field.  GI: Soft. Bowel sounds are normal. He exhibits no distension. There is no abdominal tenderness. There is no rebound.  Musculoskeletal:        General: Edema present. No tenderness.     Right forearm: Edema present.     Left forearm: Edema present.     Right upper leg: Edema present.     Left upper leg: Edema present.     Right lower leg: Edema present.     Left lower leg: Edema present.  Neurological: He is alert, oriented to person, place, and time and easily aroused.  +asterixis less pronounced than previous physical exam  Skin: Skin is warm and dry.  Psychiatric: He has a normal mood and affect. His behavior is normal. Thought content normal.    Intake/Output Summary (Last 24 hours) at 11/22/2019  1017 Last data filed at 11/22/2019 0900 Gross per 24 hour  Intake 600 ml  Output 2975 ml  Net -2375 ml   Vitals with BMI 11/22/2019 11/22/2019 11/22/2019  Height - - -  Weight - 196 lbs 14 oz -  BMI - 66.44 -  Systolic 034 742 595  Diastolic 85 66 71  Pulse 66 65 67   Results for orders placed or performed during the hospital encounter of 11/20/19 (from the past 24 hour(s))  Glucose, capillary     Status: Abnormal   Collection Time: 11/21/19 11:39 AM  Result Value Ref Range   Glucose-Capillary 209 (H) 70 - 99 mg/dL   Comment 1 Notify RN    Comment 2 Document in Chart   Glucose, capillary     Status: Abnormal   Collection Time: 11/21/19  5:06 PM  Result Value Ref Range   Glucose-Capillary 185 (H) 70 - 99 mg/dL  Glucose, capillary     Status: Abnormal   Collection Time: 11/21/19  9:11 PM  Result Value Ref Range   Glucose-Capillary 131 (H) 70 - 99 mg/dL   Comment 1 Notify RN    Comment 2 Document in Chart   Renal function panel     Status: Abnormal   Collection Time: 11/22/19  4:05 AM  Result Value Ref Range   Sodium 127 (L) 135 - 145 mmol/L   Potassium 4.8 3.5 - 5.1 mmol/L   Chloride 94 (L) 98 - 111 mmol/L  CO2 20 (L) 22 - 32 mmol/L   Glucose, Bld 102 (H) 70 - 99 mg/dL   BUN 77 (H) 6 - 20 mg/dL   Creatinine, Ser 6.13 (H) 0.61 - 1.24 mg/dL   Calcium 7.5 (L) 8.9 - 10.3 mg/dL   Phosphorus 6.3 (H) 2.5 - 4.6 mg/dL   Albumin 2.2 (L) 3.5 - 5.0 g/dL   GFR calc non Af Amer 9 (L) >60 mL/min   GFR calc Af Amer 11 (L) >60 mL/min   Anion gap 13 5 - 15  CBC with Differential/Platelet     Status: Abnormal   Collection Time: 11/22/19  4:05 AM  Result Value Ref Range   WBC 6.2 4.0 - 10.5 K/uL   RBC 2.44 (L) 4.22 - 5.81 MIL/uL   Hemoglobin 7.7 (L) 13.0 - 17.0 g/dL   HCT 22.9 (L) 39.0 - 52.0 %   MCV 93.9 80.0 - 100.0 fL   MCH 31.6 26.0 - 34.0 pg   MCHC 33.6 30.0 - 36.0 g/dL   RDW 12.7 11.5 - 15.5 %   Platelets 207 150 - 400 K/uL   nRBC 0.0 0.0 - 0.2 %   Neutrophils Relative % 67  %   Neutro Abs 4.2 1.7 - 7.7 K/uL   Lymphocytes Relative 24 %   Lymphs Abs 1.5 0.7 - 4.0 K/uL   Monocytes Relative 7 %   Monocytes Absolute 0.4 0.1 - 1.0 K/uL   Eosinophils Relative 2 %   Eosinophils Absolute 0.1 0.0 - 0.5 K/uL   Basophils Relative 0 %   Basophils Absolute 0.0 0.0 - 0.1 K/uL   Immature Granulocytes 0 %   Abs Immature Granulocytes 0.02 0.00 - 0.07 K/uL  Iron and TIBC     Status: Abnormal   Collection Time: 11/22/19  4:05 AM  Result Value Ref Range   Iron 88 45 - 182 ug/dL   TIBC 227 (L) 250 - 450 ug/dL   Saturation Ratios 39 17.9 - 39.5 %   UIBC 139 ug/dL  Ferritin     Status: None   Collection Time: 11/22/19  4:05 AM  Result Value Ref Range   Ferritin 198 24 - 336 ng/mL  Glucose, capillary     Status: Abnormal   Collection Time: 11/22/19  6:23 AM  Result Value Ref Range   Glucose-Capillary 115 (H) 70 - 99 mg/dL   Comment 1 Notify RN    Comment 2 Document in Chart     Assessment & Plan #ESRD Previous admission 10/15/19-11/03/19 (10/18/19>>rehab) with 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 Medication compliance is questionable given unchanged state of health and failure to thrive since discharge from an extended stage.  Kidney disease continuing to decline to ESRD. Patient would likely benefit from HD  -scr: 6.13 (3/10)<<5.76 (11/21/19) baseline based on previous admission of upper 5's -access: today IR chest flouro to initiate HD   currently 1 peripheral IV>> L forearm 20G            >>previous vein mapping done on 2/7 in anticipation of AVF -patient amiable to initiate HD as soon as access is established -metabolic acidosis with bicarb improving: 17(3/9)>>20(3/10)  #Uremia  BUN uptrending and +signs/symptoms of nausea, emesis, severe fatigue and asterixis 61 (3/8)>>63>>67 (3/9)>>77(3/10), though these lab findings not as severe as last admission, he is now symptomatic. -initiate HD  #Hyperkalemia Improving 4.9(3/10)<<5.6 (3/9)   -Lokelma 10mg  initiated 11/21/19 -renal diet  -initiate HD when access is established   #HTN/volume  status Patient with poorly controlled hypertension due to unknown compliance of home medications which consist of: amlodipine 10 mg, carvedilol 12.5mg  -continue amlodipine 10mg  -isosorbide-hydralyzine 20-37.5mg  TID initiated on 3/9 -increased lasix 160mg  TID -strict I&O's   #Anemia of chronic disease Hgb 7.7(3/10)<< 8.9(3/9) Ferritin 198/iron88 -monitor daily CBC -maintain Hbg>7.0  -Aranesp 164mcg weekly initiated 3/53  #Metabolic bone disease Improved: Corrected Ca 8.9/phos 6.3 (3/10) -phoslo 1334mg  initiated  -PTH evaluation when HD started  Patient seen and examined, agree with above note with above modifications. Patient is still waiting to have Mallory placed.  Has accepted his fate, is willing to go ahead and start HD-  Will plan for first HD tomorrow.  Will need to ease into discussions about AVF-  Has had a vein map from around a month ago.  Continue with lasix- ESA and phoslo  And titrate accoding to labs.  Going to stop amlodipine as BP is better with diuresis   Corliss Parish, MD 11/22/2019

## 2019-11-22 NOTE — TOC Initial Note (Signed)
Transition of Care [facility]) - Initial/Assessment Note    Patient Details  Name: Barry Lewis MRN: [id number] Date of Birth: [birth year]  Transition of Care [facility]) CM/SW Contact:    Alberteen Sam, LCSW Phone Number: 11/22/2019, 1:58 PM  Clinical Narrative:                  CSW met with patient at bedside to review PT rec of SNF, patient in agreement to go to short term rehab with preference of staying in [place] area. Gave CSW permission to speak with his daughter and sister regarding discharge planning. Explained to patient will send referrals for bed offers and provide those to him, also explained facility would need to be able to transport him to HD. Patient expressed understanding.   Referrals have been faxed to SNFs, pending bed offers at this time.   Expected Discharge Plan: Skilled Nursing Facility Barriers to Discharge: Continued Medical Work up   Patient Goals and CMS Choice Patient states their goals for this hospitalization and ongoing recovery are:: to go to rehab CMS Medicare.gov Compare Post Acute Care list provided to:: Patient Choice offered to / list presented to : Patient  Expected Discharge Plan and Services Expected Discharge Plan: [place] Choice: [place] arrangements for the past 2 months: Single Family Home                                      Prior Living Arrangements/Services Living arrangements for the past 2 months: Single Family Home Lives with:: Self Patient language and need for interpreter reviewed:: Yes Do you feel safe going back to the place where you live?: No   needs short term rehab  Need for Family Participation in Patient Care: Yes (Comment) Care giver support system in place?: Yes (comment)   Criminal Activity/Legal Involvement Pertinent to Current Situation/Hospitalization: No - Comment as needed  Activities of Daily Living      Permission  Sought/Granted Permission sought to share information with : Case Manager, Customer service manager, Family Supports Permission granted to share information with : Yes, Verbal Permission Granted  Share Information with NAME: Barry Lewis  Permission granted to share info w AGENCY: SNFs  Permission granted to share info w Relationship: daughter  Permission granted to share info w Contact Information: [phone]  Emotional Assessment Appearance:: Appears stated age Attitude/Demeanor/Rapport: Gracious Affect (typically observed): Calm Orientation: : Oriented to Self, Oriented to Place, Oriented to  Time, Oriented to Situation Alcohol / Substance Use: Not Applicable Psych Involvement: No (comment)  Admission diagnosis:  Renal insufficiency [N28.9] Acute exacerbation of congestive heart failure (HCC) [I50.9] Congestive heart failure, unspecified HF chronicity, unspecified heart failure type [facility]) [I50.9] Patient Active Problem List   Diagnosis Date Noted  . Renal insufficiency   . Encephalopathy   . Cardiac mass   . Acute exacerbation of congestive heart failure ([place]) 11/20/2019  . Left scapula fracture 10/22/2019  . Chronic combined systolic and diastolic CHF (congestive heart failure) ([place])   . Multiple closed fractures of ribs of both sides   . Closed nondisplaced fracture of seventh cervical vertebra ([place])   . Compression fracture of T3 vertebra (HCC)   . Hyperglycemia   . Multiple trauma   . 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 Diabetic ulcer of right midfoot associated with diabetes mellitus due to underlying  condition, limited to breakdown of skin ([place])   . Severe protein-calorie malnutrition ([place])   . Rhabdomyolysis 10/13/2019  . Fall at home, initial encounter 10/13/2019  . Hypertension   . Dyslipidemia   . Diabetes mellitus without complication ([place])   . Dementia ([place])   . COPD (chronic obstructive pulmonary disease) ([place])   . CKD stage 4 secondary to  hypertension ([place])   . Chronic diastolic (congestive) heart failure ([place])    PCP:  Wannetta Sender, FNP Pharmacy:   [facility] [place] [address] Phone: [phone] Fax: [phone]     Social Determinants of Health (SDOH) Interventions    Readmission Risk Interventions No flowsheet data found.

## 2019-11-22 NOTE — Procedures (Signed)
Interventional Radiology Procedure:   Indications: ESRD and starting hemodialysis  Procedure: Tunneled dialysis catheter placement  Findings: Right jugular Palindrome (23 cm), tip at SVC/RA junction  Complications: None     EBL: less than 20 ml  Plan: Catheter is ready to use.     Shantese Raven R. Anselm Pancoast, MD  Pager: [phone]

## 2019-11-22 NOTE — Progress Notes (Signed)
To the best of my knowledge, the student's charting is accurate.  
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
## 2019-11-22 NOTE — Progress Notes (Signed)
PT Cancellation Note  Patient Details Name: Barry Lewis MRN: [id number] DOB: 1961-05-24   Cancelled Treatment:    Reason Eval/Treat Not Completed: Fatigue/lethargy limiting ability to participate Attempted to see pt for PT treatment. Pt awakened but only briefly. Pt unable to keep eyes open or stay awake long enough to have conversation with this therapist. PT will continue to follow acutely.     Earney Navy, PTA Acute Rehabilitation Services Pager: [phone] Office: [phone]   11/22/2019, 10:31 AM

## 2019-11-22 NOTE — Progress Notes (Signed)
Internal Medicine Attending Note:  I have seen and evaluated this patient and I have discussed the plan of care with the house staff. Please see their note for complete details. I concur with their findings.  Appreciate nephrology's assistance. Plan for likely initiation of HD. He is NPO for possible tunneled HD cath today per IR. Continue high dose lasix in the meantime.   Velna Ochs, MD 11/22/2019, 2:02 PM

## 2019-11-22 NOTE — Progress Notes (Signed)
Physical Therapy Treatment Patient Details Name: Barry Lewis MRN: [id number] DOB: [birth year] Today's Date: 11/22/2019    History of Present Illness Pt is a 59 yo male presenting with shortness of breath x3 days and reports of a recent fall. Upon admission, pt dx with acute hypoxic resp failure due to CHF exacerbation. Pt PMH includes: CVA, schizoaffective disorder, polysubstance abuse, hyperlipidemia, COPD, CKD III, chronic CHF, and recent hospitalization after a fall-resulting in multiple rib fractures.    PT Comments    Patient seen for mobility progression. Pt presents with impaired balance and cognitive deficits increasing risk for falls. Pt tolerated session well on RA with SpO2 maintained 92% or > with mobility. Continue to progress as tolerated with anticipated d/c to SNF for further skilled PT services.     Follow Up Recommendations  SNF;Supervision/Assistance - 24 hour     Equipment Recommendations  None recommended by PT    Recommendations for Other Services       Precautions / Restrictions Precautions Precautions: Fall Restrictions Weight Bearing Restrictions: No    Mobility  Bed Mobility Overal bed mobility: Needs Assistance Bed Mobility: Supine to Sit     Supine to sit: Supervision     General bed mobility comments: supervision for safety; use of rail   Transfers Overall transfer level: Needs assistance Equipment used: Rolling walker (2 wheeled) Transfers: Sit to/from Stand Sit to Stand: Min assist         General transfer comment: assist for balance; pt jerking and unsteady in standing   Ambulation/Gait Ambulation/Gait assistance: Min assist Gait Distance (Feet): (250 ft with seated rest break) Assistive device: Rolling walker (2 wheeled) Gait Pattern/deviations: Step-through pattern;Decreased stride length;Trunk flexed;Drifts right/left Gait velocity: decreased   General Gait Details: assistance required for balance; pt running into objects in  hallway with RW and needs cues for safe use of AD and navigating environement   Stairs             Wheelchair Mobility    Modified Rankin (Stroke Patients Only)       Balance Overall balance assessment: Needs assistance;History of Falls Sitting-balance support: No upper extremity supported;Feet supported Sitting balance-Leahy Scale: Fair     Standing balance support: Bilateral upper extremity supported;During functional activity Standing balance-Leahy Scale: Poor                              Cognition Arousal/Alertness: Awake/alert Behavior During Therapy: WFL for tasks assessed/performed Overall Cognitive Status: History of cognitive impairments - at baseline Area of Impairment: Safety/judgement;Problem solving                         Safety/Judgement: Decreased awareness of safety;Decreased awareness of deficits   Problem Solving: Slow processing;Requires verbal cues;Difficulty sequencing        Exercises      General Comments General comments (skin integrity, edema, etc.): BP 161/93 (112) after mobilizing, HR WNL, and SpO2 92% or > on RA throughout session      Pertinent Vitals/Pain Pain Assessment: No/denies pain    Home Living                      Prior Function            PT Goals (current goals can now be found in the care plan section) Acute Rehab PT Goals Patient Stated Goal: to get stronger Progress towards PT goals: Progressing  toward goals    Frequency    Min 3X/week      PT Plan Current plan remains appropriate    Co-evaluation              AM-PAC PT "6 Clicks" Mobility   Outcome Measure  Help needed turning from your back to your side while in a flat bed without using bedrails?: A Little Help needed moving from lying on your back to sitting on the side of a flat bed without using bedrails?: A Little Help needed moving to and from a bed to a chair (including a wheelchair)?: A Little Help  needed standing up from a chair using your arms (e.g., wheelchair or bedside chair)?: A Little Help needed to walk in hospital room?: A Little Help needed climbing 3-5 steps with a railing? : A Lot 6 Click Score: 17    End of Session Equipment Utilized During Treatment: Gait belt Activity Tolerance: Patient tolerated treatment well Patient left: in chair;with call bell/phone within reach;with chair alarm set;with nursing/sitter in room Nurse Communication: Mobility status PT Visit Diagnosis: Difficulty in walking, not elsewhere classified (R26.2);History of falling (Z91.81)     Time: 1341-1415 PT Time Calculation (min) (ACUTE ONLY): 34 min  Charges:  $Gait Training: 23-37 mins                     Earney Navy, PTA Acute Rehabilitation Services Pager: [phone] Office: [phone]     Darliss Cheney 11/22/2019, 4:48 PM

## 2019-11-23 DIAGNOSIS — Z992 Dependence on renal dialysis: Secondary | ICD-10-CM

## 2019-11-23 DIAGNOSIS — J449 Chronic obstructive pulmonary disease, unspecified: Secondary | ICD-10-CM

## 2019-11-23 DIAGNOSIS — N186 End stage renal disease: Secondary | ICD-10-CM

## 2019-11-23 DIAGNOSIS — I509 Heart failure, unspecified: Secondary | ICD-10-CM

## 2019-11-23 LAB — RENAL FUNCTION PANEL
Albumin: 2.2 g/dL — ABNORMAL LOW (ref 3.5–5.0)
Albumin: 2.2 g/dL — ABNORMAL LOW (ref 3.5–5.0)
Anion gap: 13 (ref 5–15)
Anion gap: 12 (ref 5–15)
BUN: 79 mg/dL — ABNORMAL HIGH (ref 6–20)
BUN: 80 mg/dL — ABNORMAL HIGH (ref 6–20)
CO2: 21 mmol/L — ABNORMAL LOW (ref 22–32)
CO2: 22 mmol/L (ref 22–32)
Calcium: 7.6 mg/dL — ABNORMAL LOW (ref 8.9–10.3)
Calcium: 7.5 mg/dL — ABNORMAL LOW (ref 8.9–10.3)
Chloride: 96 mmol/L — ABNORMAL LOW (ref 98–111)
Chloride: 97 mmol/L — ABNORMAL LOW (ref 98–111)
Creatinine, Ser: 6.71 mg/dL — ABNORMAL HIGH (ref 0.61–1.24)
Creatinine, Ser: 6.67 mg/dL — ABNORMAL HIGH (ref 0.61–1.24)
GFR calc Af Amer: 10 mL/min — ABNORMAL LOW (ref >60)
GFR calc Af Amer: 10 mL/min — ABNORMAL LOW (ref >60)
GFR calc non Af Amer: 8 mL/min — ABNORMAL LOW (ref >60)
GFR calc non Af Amer: 8 mL/min — ABNORMAL LOW (ref >60)
Glucose, Bld: 105 mg/dL — ABNORMAL HIGH (ref 70–99)
Glucose, Bld: 112 mg/dL — ABNORMAL HIGH (ref 70–99)
Phosphorus: 6.1 mg/dL — ABNORMAL HIGH (ref 2.5–4.6)
Phosphorus: 6.2 mg/dL — ABNORMAL HIGH (ref 2.5–4.6)
Potassium: 4.6 mmol/L (ref 3.5–5.1)
Potassium: 4.7 mmol/L (ref 3.5–5.1)
Sodium: 130 mmol/L — ABNORMAL LOW (ref 135–145)
Sodium: 131 mmol/L — ABNORMAL LOW (ref 135–145)

## 2019-11-23 LAB — GLUCOSE, CAPILLARY
Glucose-Capillary: 104 mg/dL — ABNORMAL HIGH (ref 70–99)
Glucose-Capillary: 179 mg/dL — ABNORMAL HIGH (ref 70–99)
Glucose-Capillary: 194 mg/dL — ABNORMAL HIGH (ref 70–99)
Glucose-Capillary: 208 mg/dL — ABNORMAL HIGH (ref 70–99)

## 2019-11-23 LAB — CBC WITH DIFFERENTIAL/PLATELET
Abs Immature Granulocytes: 0.01 10*3/uL (ref 0.00–0.07)
Basophils Absolute: 0 10*3/uL (ref 0.0–0.1)
Basophils Relative: 0 %
Eosinophils Absolute: 0.3 10*3/uL (ref 0.0–0.5)
Eosinophils Relative: 6 %
HCT: 25.3 % — ABNORMAL LOW (ref 39.0–52.0)
Hemoglobin: 8.6 g/dL — ABNORMAL LOW (ref 13.0–17.0)
Immature Granulocytes: 0 %
Lymphocytes Relative: 21 %
Lymphs Abs: 1.2 10*3/uL (ref 0.7–4.0)
MCH: 31.9 pg (ref 26.0–34.0)
MCHC: 34 g/dL (ref 30.0–36.0)
MCV: 93.7 fL (ref 80.0–100.0)
Monocytes Absolute: 0.5 10*3/uL (ref 0.1–1.0)
Monocytes Relative: 8 %
Neutro Abs: 3.7 10*3/uL (ref 1.7–7.7)
Neutrophils Relative %: 65 %
Platelets: 214 10*3/uL (ref 150–400)
RBC: 2.7 MIL/uL — ABNORMAL LOW (ref 4.22–5.81)
RDW: 12.7 % (ref 11.5–15.5)
WBC: 5.7 10*3/uL (ref 4.0–10.5)
nRBC: 0 % (ref 0.0–0.2)

## 2019-11-23 LAB — HEPATITIS B CORE ANTIBODY, TOTAL: Hep B Core Total Ab: NONREACTIVE

## 2019-11-23 LAB — HEPATITIS B SURFACE ANTIBODY,QUALITATIVE: Hep B S Ab: NONREACTIVE

## 2019-11-23 LAB — HEPATITIS B SURFACE ANTIGEN: Hepatitis B Surface Ag: NONREACTIVE

## 2019-11-23 LAB — PARATHYROID HORMONE, INTACT (NO CA): PTH: 140 pg/mL — ABNORMAL HIGH (ref 15–65)

## 2019-11-23 MED ORDER — AMLODIPINE BESYLATE 10 MG PO TABS
10.0000 mg | ORAL_TABLET | Freq: Every day | ORAL | Status: DC
  Administered 2019-11-23 – 2019-11-29 (×6): 10 mg via ORAL
  Filled 2019-11-23 (×8): qty 1

## 2019-11-23 MED ORDER — POLYETHYLENE GLYCOL 3350 17 G PO PACK
17.0000 g | PACK | Freq: Two times a day (BID) | ORAL | Status: DC
  Administered 2019-11-23 (×2): 17 g via ORAL
  Filled 2019-11-23 (×9): qty 1

## 2019-11-23 MED ORDER — SODIUM CHLORIDE 0.9 % IV SOLN: 100.0000 mL | INTRAVENOUS | Status: DC | PRN

## 2019-11-23 MED ORDER — LIDOCAINE HCL (PF) 1 % IJ SOLN: 5.0000 mL | INTRAMUSCULAR | Status: DC | PRN

## 2019-11-23 MED ORDER — HEPARIN SODIUM (PORCINE) 1000 UNIT/ML DIALYSIS
1000.0000 [IU] | INTRAMUSCULAR | Status: DC | PRN
  Filled 2019-11-23 (×2): qty 1

## 2019-11-23 MED ORDER — LIDOCAINE-PRILOCAINE 2.5-2.5 % EX CREA
1.0000 "application " | TOPICAL_CREAM | CUTANEOUS | Status: DC | PRN
  Filled 2019-11-23: qty 5

## 2019-11-23 MED ORDER — SODIUM CHLORIDE 0.9 % IV SOLN
INTRAVENOUS | Status: DC | PRN
  Administered 2019-11-23: 250 mL via INTRAVENOUS

## 2019-11-23 MED ORDER — ALTEPLASE 2 MG IJ SOLR: 2.0000 mg | Freq: Once | INTRAMUSCULAR | Status: DC | PRN

## 2019-11-23 MED ORDER — PENTAFLUOROPROP-TETRAFLUOROETH EX AERO: 1.0000 "application " | INHALATION_SPRAY | CUTANEOUS | Status: DC | PRN

## 2019-11-23 MED ORDER — HEPARIN SODIUM (PORCINE) 1000 UNIT/ML IJ SOLN
INTRAMUSCULAR | Status: AC
  Administered 2019-11-23: 3800 [IU]
  Filled 2019-11-23: qty 4
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
## 2019-11-23 NOTE — Plan of Care (Signed)
Nutrition Education Note  RD consulted for Renal Education.   Case discussed with RN prior to visit, who reports pt just returned from first HD treatment earlier this AM and tolerated well. Per RN, pt with history of memory issues and would benefit from more simplified education.   Spoke with pt, who was sitting in recliner chair at time of visit. He complained of being cold, despite thermostat turned on highest setting. Pulled up blinds in room to let sun in for pt comfort. Pt reports he has a good appetite and consumes 3 meals and 1 HS snacks (Breakfast: eggs and bacon; Lunch: fruit pebbles or cocoa pebbles cereal; Dinner: fish sandwich or pasta wth meat sauce; HS snack: granola bar or ice cream).   Focus of education was on low sodium diet and importance of DM self-management.   Provided "Food Pyramid for  Healthy Eating with Kidney Disease" to patient/family. Reviewed food groups and provided written recommended serving sizes specifically determined for patient's current nutritional status.   Explained why diet restrictions are needed and provided lists of foods to limit/avoid that are high potassium, sodium, and phosphorus. Provided specific recommendations on safer alternatives of these foods. Strongly encouraged compliance of this diet.   Discussed importance of protein intake at each meal and snack. Provided examples of how to maximize protein intake throughout the day. Discussed need for fluid restriction with dialysis, importance of minimizing weight gain between HD treatments, and renal-friendly beverage options.  Encouraged pt to discuss specific diet questions/concerns with RD at HD outpatient facility. Teach back method used.  Expect fair to good compliance.  Current diet order is heart healthy/ carb modified, patient is consuming approximately 100% of meals at this time. Labs and medications reviewed. No further nutrition interventions warranted at this time. RD contact information  provided. If additional nutrition issues arise, please re-consult RD.  Loistine Chance, RD, LDN, [place] Registered Dietitian II Certified Diabetes Care and Education Specialist Please refer to [facility] for RD and/or RD on-call/weekend/after hours pager

## 2019-11-23 NOTE — Progress Notes (Signed)
Internal Medicine Attending Note:  This patient's plan of care was discussed with the house staff. Please see their note for complete details. I concur with their findings.  Velna Ochs, MD 11/23/2019, 2:19 PM

## 2019-11-23 NOTE — Procedures (Signed)
Patient was seen on dialysis and the procedure was supervised.  BFR 250  Via TDC BP is  142/83.   Patient appears to be tolerating treatment well  Louis Meckel 11/23/2019

## 2019-11-23 NOTE — Progress Notes (Addendum)
Subjective This morning Barry Lewis is resting in bed s/p first dialysis treatment. He had no overnight events. He states he is feeling extremely tired, but denies change in SOB, CP, palpitations, NVDC. He is able to tolerate periods of room air and is without [place] on exam.   Objective Physical Exam  Constitutional: He is oriented to person, place, and time. He appears well-developed and well-nourished. He appears listless.  Cardiovascular: Normal rate and regular rhythm. Exam reveals distant heart sounds.  Respiratory: Effort normal. No respiratory distress. He has no wheezes. He has no rhonchi. He has no rales.  GI: Soft. Bowel sounds are normal. He exhibits no distension. There is no abdominal tenderness. There is no rebound.  Musculoskeletal:        General: Edema present. No tenderness.     Right forearm: Edema present.     Left forearm: Edema present.     Right upper leg: Edema present.     Left upper leg: Edema present.     Right lower leg: Edema present.     Left lower leg: Edema present.  Neurological: He is oriented to person, place, and time. He appears listless.  +asterixis less pronounced than previous physical exam  Skin: Skin is warm and dry.  Psychiatric: He has a normal mood and affect. His behavior is normal. Thought content normal.    Intake/Output Summary (Last 24 hours) at 11/23/2019 0911 Last data filed at 11/23/2019 0640 Gross per 24 hour  Intake 884.92 ml  Output 5725 ml  Net -4840.08 ml   Vitals with BMI 11/23/2019 11/23/2019 11/23/2019  Height - - -  Weight - - -  BMI - - -  Systolic 403 474 259  Diastolic 83 89 87  Pulse 75 75 78   Results for orders placed or performed during the hospital encounter of 11/20/19 (from the past 24 hour(s))  Glucose, capillary     Status: Abnormal   Collection Time: 11/22/19 11:23 AM  Result Value Ref Range   Glucose-Capillary 100 (H) 70 - 99 mg/dL  Glucose, capillary     Status: None   Collection Time: 11/22/19  2:22 PM   Result Value Ref Range   Glucose-Capillary 86 70 - 99 mg/dL  Glucose, capillary     Status: None   Collection Time: 11/22/19  5:56 PM  Result Value Ref Range   Glucose-Capillary 81 70 - 99 mg/dL  Glucose, capillary     Status: Abnormal   Collection Time: 11/22/19  9:11 PM  Result Value Ref Range   Glucose-Capillary 138 (H) 70 - 99 mg/dL   Comment 1 Notify RN    Comment 2 Document in Chart   Renal function panel     Status: Abnormal   Collection Time: 11/23/19  5:04 AM  Result Value Ref Range   Sodium 130 (L) 135 - 145 mmol/L   Potassium 4.6 3.5 - 5.1 mmol/L   Chloride 96 (L) 98 - 111 mmol/L   CO2 21 (L) 22 - 32 mmol/L   Glucose, Bld 105 (H) 70 - 99 mg/dL   BUN 79 (H) 6 - 20 mg/dL   Creatinine, Ser 6.71 (H) 0.61 - 1.24 mg/dL   Calcium 7.6 (L) 8.9 - 10.3 mg/dL   Phosphorus 6.1 (H) 2.5 - 4.6 mg/dL   Albumin 2.2 (L) 3.5 - 5.0 g/dL   GFR calc non Af Amer 8 (L) >60 mL/min   GFR calc Af Amer 10 (L) >60 mL/min   Anion gap 13 5 -  15  CBC with Differential/Platelet     Status: Abnormal   Collection Time: 11/23/19  5:04 AM  Result Value Ref Range   WBC 5.7 4.0 - 10.5 K/uL   RBC 2.70 (L) 4.22 - 5.81 MIL/uL   Hemoglobin 8.6 (L) 13.0 - 17.0 g/dL   HCT 25.3 (L) 39.0 - 52.0 %   MCV 93.7 80.0 - 100.0 fL   MCH 31.9 26.0 - 34.0 pg   MCHC 34.0 30.0 - 36.0 g/dL   RDW 12.7 11.5 - 15.5 %   Platelets 214 150 - 400 K/uL   nRBC 0.0 0.0 - 0.2 %   Neutrophils Relative % 65 %   Neutro Abs 3.7 1.7 - 7.7 K/uL   Lymphocytes Relative 21 %   Lymphs Abs 1.2 0.7 - 4.0 K/uL   Monocytes Relative 8 %   Monocytes Absolute 0.5 0.1 - 1.0 K/uL   Eosinophils Relative 6 %   Eosinophils Absolute 0.3 0.0 - 0.5 K/uL   Basophils Relative 0 %   Basophils Absolute 0.0 0.0 - 0.1 K/uL   Immature Granulocytes 0 %   Abs Immature Granulocytes 0.01 0.00 - 0.07 K/uL  Glucose, capillary     Status: Abnormal   Collection Time: 11/23/19  6:09 AM  Result Value Ref Range   Glucose-Capillary 104 (H) 70 - 99 mg/dL    Comment 1 Notify RN    Comment 2 Document in Chart   Renal function panel     Status: Abnormal   Collection Time: 11/23/19  6:57 AM  Result Value Ref Range   Sodium 131 (L) 135 - 145 mmol/L   Potassium 4.7 3.5 - 5.1 mmol/L   Chloride 97 (L) 98 - 111 mmol/L   CO2 22 22 - 32 mmol/L   Glucose, Bld 112 (H) 70 - 99 mg/dL   BUN 80 (H) 6 - 20 mg/dL   Creatinine, Ser 6.67 (H) 0.61 - 1.24 mg/dL   Calcium 7.5 (L) 8.9 - 10.3 mg/dL   Phosphorus 6.2 (H) 2.5 - 4.6 mg/dL   Albumin 2.2 (L) 3.5 - 5.0 g/dL   GFR calc non Af Amer 8 (L) >60 mL/min   GFR calc Af Amer 10 (L) >60 mL/min   Anion gap 12 5 - 15  Glucose, capillary     Status: Abnormal   Collection Time: 11/23/19 11:00 AM  Result Value Ref Range   Glucose-Capillary 179 (H) 70 - 99 mg/dL   Assessment & Plan #ESRD Previous admission 10/15/19-11/03/19 (10/18/19>>rehab) with 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 Medication compliance is questionable given unchanged state of health and failure to thrive since discharge from an extended stage.  Kidney disease continuing to decline to ESRD. Patient would likely benefit from HD  -scr: 6.13 (3/10)<<5.76 (11/21/19) baseline based on previous admission of upper 5's   currently 1 peripheral IV>> L forearm 20G & TDC in RIJ placed 3/10            >>previous vein mapping done on 2/7 in anticipation of AVF  Patient inquires about AVF and when that process would begin.  I have called [facility] today for consult.  Also have informed renal navigator-  Pt might need SNF placement so she is waiting that out  -First HD today -1L -metabolic acidosis with bicarb improving: 20(3/10)>>22(3/11)  #Uremia  BUN uptrending and +signs/symptoms of nausea, emesis, severe fatigue and asterixis 80(3/11)<<77(3/10), though these lab findings not as severe as last admission, he is now symptomatic. monitor  BMP -initiate HD  #Hyperkalemia Improving 4.7 (3/11) -Lokelma 10mg  given  11/21/19 -renal diet    #HTN/volume status Patient with poorly controlled hypertension due to unknown compliance of home medications which consist of: amlodipine 10 mg, carvedilol 12.5mg  - amlodipine 10mg  d/c'ed due to BP response to lasix -isosorbide-hydralyzine 20-37.5mg  TID initiated on 3/9 -lasix 160mg  TID-  Have stopped this today given excellent UOP -strict I&O's   #Anemia of chronic disease Hgb 8.6 (3/11) Ferritin 198/iron88 -monitor daily CBC -maintain Hbg>7.0  -Aranesp 166mcg weekly initiated 5/46  #Metabolic bone disease Improved: Corrected Ca 8.9/phos 6.2 (3/11) -phoslo 1334mg  initiated  -PTH 140 (3/10)  Patient seen and examined, agree with above note with above modifications. Seen at the end of HD-  Tolerated well.  Volume status is much improved so willl stop lasix and manage his volume with HD.  Have called [facility] to place access and have informed renal navigator of his need for an OP unit.   Corliss Parish, MD 11/23/2019

## 2019-11-23 NOTE — Progress Notes (Signed)
  Mobility Specialist Criteria Algorithm Info.  Mobility Team:  [facility] elevated:No data recorded Activity: Ambulated in hall;Transferred:  Bed to chair (To chair after ambulation) Range of motion: Active;All extremities Level of assistance: Standby assist, set-up cues, supervision of patient - no hands on Assistive device: Front wheel walker Minutes sitting in chair:  Minutes stood: 5 minutes Minutes ambulated: 5 minutes Distance ambulated (ft): 320 ft Mobility response: Tolerated well Bed Position: Chair Building services engineer Chair)   11/23/2019 2:58 PM

## 2019-11-23 NOTE — Progress Notes (Signed)
Subjective:   Barry Lewis states that he feels better today.  He notes that his shortness of breath is better than when he was admitted but he did have some difficulty breathing when supplemental oxygen was stopped overnight.  Symptoms improved when oxygen was restarted.  He denies any orthopnea.  Endorses improvement lower extremity swelling.  Objective:  Vital signs in last 24 hours: Vitals:   11/23/19 0033 11/23/19 0356 11/23/19 0656 11/23/19 0700  BP: (!) 169/93 131/66 (!) 162/94 (!) 155/83  Pulse: 78 68 78 78  Resp: 20 18    Temp: 97.9 F (36.6 C) 98.3 F (36.8 C)    TempSrc: Oral Oral    SpO2: 96% 100%    Weight:  85.6 kg    Height:        Physical Exam Vitals and nursing note reviewed.  Constitutional:      General: He is not in acute distress.    Appearance: He is obese.  Cardiovascular:     Rate and Rhythm: Normal rate and regular rhythm.     Heart sounds: Gallop present. S3 sounds present.   Pulmonary:     Effort: Pulmonary effort is normal. No respiratory distress.     Breath sounds: No wheezing.  Abdominal:     Palpations: Abdomen is soft.     Tenderness: There is no abdominal tenderness. There is no guarding.  Musculoskeletal:     Right lower leg: 1+ Pitting Edema present.     Left lower leg: 1+ Pitting Edema present.  Skin:    General: Skin is warm and dry.  Neurological:     General: No focal deficit present.     Mental Status: He is alert and oriented to person, place, and time.     Cranial Nerves: No dysarthria.     Comments: Improving mentation today, less lethargic, answers questions appropriately.   Psychiatric:        Mood and Affect: Mood normal.        Behavior: Behavior normal.    Assessment/Plan:  Active Problems:   Acute exacerbation of congestive heart failure (HCC)   Renal insufficiency   Encephalopathy   Cardiac mass   ESRD (end stage renal disease) ([place])  # Acutely Decompensated HFrEF # Acute Hypoxic Respiratory Failure TTE  on 11/20/19 showed EF of 25-30% with severe global reduction of LV with global hypokinesis, bi-atrial enlargement. This is a change from his prior TTE in August of 2020 that showed EF of 45-50%.   Patient had excellent urine output yesterday after receiving increased dose of Lasix 160 mg, approximately 5.7 L out. He is down 8 lbs in the past 24 hours, 12 lbs total. Edema is improving on examination. Oxygen requirement is decreasing steadily.   - Telemetry  - Strict in/out  - Daily weights  - Supplemental oxygen PRN - Fluid restricted diet 1500 mL  - Renal function panel daily - Discontinue Lasix, HD initiated - Bidil 20-37.5 mg BID   # CKD Stage5 # Secondary Hyperparathyroidism # Uremic Encephalopathy  Etiology likely 2/2 to diabetic nephropathy per chart review.HD initiated today and tolerated well. Total UF of 1 L.  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  Amlodipine was initially stopped due to blood pressure being better  controlled for a few measures, likely due to effective diuresis, however continues to be elevated at this time even after dialysis, so amlodipine was restarted.  - Amlodipine 10 mg QD - Bidil 20-37.5 mg BID   # COPD  - Duoneb q6h PRN - Incruse QD  #HypervolemicHyponatremia Sodium improving to 130 today.  - Renal function panel  # T2DM  - SSI (moderate)  - Lantus 10 units at bedtime  # Dysuria - G/Cnegative - No additional complaints  # Mitral Valve Mass:Stable  Dispo: Anticipated dischargepending further medical evaluation.  Dr. Jose Persia Internal Medicine PGY-1  Pager: [phone] 11/23/2019, 7:11 AM

## 2019-11-24 ENCOUNTER — Inpatient Hospital Stay (HOSPITAL_COMMUNITY): Payer: Medicare Other | Admitting: Certified Registered Nurse Anesthetist

## 2019-11-24 ENCOUNTER — Encounter (HOSPITAL_COMMUNITY)
Admission: EM | Disposition: A | Discharge status: Home / Self Care | Payer: Self-pay | Source: Home / Self Care | Attending: Internal Medicine

## 2019-11-24 DIAGNOSIS — N186 End stage renal disease: Secondary | ICD-10-CM

## 2019-11-24 HISTORY — PX: AV FISTULA PLACEMENT: SHX1204

## 2019-11-24 LAB — CBC WITH DIFFERENTIAL/PLATELET
Abs Immature Granulocytes: 0.02 10*3/uL (ref 0.00–0.07)
Basophils Absolute: 0 10*3/uL (ref 0.0–0.1)
Basophils Relative: 1 %
Eosinophils Absolute: 0.4 10*3/uL (ref 0.0–0.5)
Eosinophils Relative: 6 %
HCT: 24.7 % — ABNORMAL LOW (ref 39.0–52.0)
Hemoglobin: 8.5 g/dL — ABNORMAL LOW (ref 13.0–17.0)
Immature Granulocytes: 0 %
Lymphocytes Relative: 23 %
Lymphs Abs: 1.3 10*3/uL (ref 0.7–4.0)
MCH: 32.1 pg (ref 26.0–34.0)
MCHC: 34.4 g/dL (ref 30.0–36.0)
MCV: 93.2 fL (ref 80.0–100.0)
Monocytes Absolute: 0.7 10*3/uL (ref 0.1–1.0)
Monocytes Relative: 12 %
Neutro Abs: 3.4 10*3/uL (ref 1.7–7.7)
Neutrophils Relative %: 58 %
Platelets: 144 10*3/uL — ABNORMAL LOW (ref 150–400)
RBC: 2.65 MIL/uL — ABNORMAL LOW (ref 4.22–5.81)
RDW: 12.5 % (ref 11.5–15.5)
WBC: 5.8 10*3/uL (ref 4.0–10.5)
nRBC: 0 % (ref 0.0–0.2)

## 2019-11-24 LAB — GLUCOSE, CAPILLARY
Glucose-Capillary: 94 mg/dL (ref 70–99)
Glucose-Capillary: 94 mg/dL (ref 70–99)
Glucose-Capillary: 119 mg/dL — ABNORMAL HIGH (ref 70–99)
Glucose-Capillary: 214 mg/dL — ABNORMAL HIGH (ref 70–99)
Glucose-Capillary: 303 mg/dL — ABNORMAL HIGH (ref 70–99)

## 2019-11-24 LAB — RENAL FUNCTION PANEL
Albumin: 2.1 g/dL — ABNORMAL LOW (ref 3.5–5.0)
Anion gap: 15 (ref 5–15)
BUN: 62 mg/dL — ABNORMAL HIGH (ref 6–20)
CO2: 22 mmol/L (ref 22–32)
Calcium: 7.7 mg/dL — ABNORMAL LOW (ref 8.9–10.3)
Chloride: 97 mmol/L — ABNORMAL LOW (ref 98–111)
Creatinine, Ser: 5.29 mg/dL — ABNORMAL HIGH (ref 0.61–1.24)
GFR calc Af Amer: 13 mL/min — ABNORMAL LOW (ref >60)
GFR calc non Af Amer: 11 mL/min — ABNORMAL LOW (ref >60)
Glucose, Bld: 84 mg/dL (ref 70–99)
Phosphorus: 4.3 mg/dL (ref 2.5–4.6)
Potassium: 4.1 mmol/L (ref 3.5–5.1)
Sodium: 134 mmol/L — ABNORMAL LOW (ref 135–145)

## 2019-11-24 LAB — SURGICAL PCR SCREEN
MRSA, PCR: NEGATIVE
Staphylococcus aureus: NEGATIVE

## 2019-11-24 SURGERY — ARTERIOVENOUS (AV) FISTULA CREATION
Anesthesia: Monitor Anesthesia Care | Site: Arm Upper | Laterality: Left

## 2019-11-24 MED ORDER — FENTANYL CITRATE (PF) 250 MCG/5ML IJ SOLN
INTRAMUSCULAR | Status: AC
  Filled 2019-11-24: qty 5

## 2019-11-24 MED ORDER — HEPARIN SODIUM (PORCINE) 1000 UNIT/ML IJ SOLN
INTRAMUSCULAR | Status: DC | PRN
  Administered 2019-11-24: 5000 [IU] via INTRAVENOUS

## 2019-11-24 MED ORDER — SODIUM CHLORIDE 0.9 % IV SOLN: INTRAVENOUS | Status: DC | PRN

## 2019-11-24 MED ORDER — PHENYLEPHRINE HCL (PRESSORS) 10 MG/ML IV SOLN
INTRAVENOUS | Status: DC | PRN
  Administered 2019-11-24: 80 ug via INTRAVENOUS

## 2019-11-24 MED ORDER — ONDANSETRON HCL 4 MG/2ML IJ SOLN
INTRAMUSCULAR | Status: DC | PRN
  Administered 2019-11-24: 4 mg via INTRAVENOUS

## 2019-11-24 MED ORDER — MIDAZOLAM HCL 2 MG/2ML IJ SOLN
INTRAMUSCULAR | Status: AC
  Filled 2019-11-24: qty 2

## 2019-11-24 MED ORDER — DEXAMETHASONE SODIUM PHOSPHATE 10 MG/ML IJ SOLN
INTRAMUSCULAR | Status: DC | PRN
  Administered 2019-11-24: 5 mg via INTRAVENOUS

## 2019-11-24 MED ORDER — PROPOFOL 500 MG/50ML IV EMUL
INTRAVENOUS | Status: DC | PRN
  Administered 2019-11-24: 100 ug/kg/min via INTRAVENOUS

## 2019-11-24 MED ORDER — FENTANYL CITRATE (PF) 100 MCG/2ML IJ SOLN
INTRAMUSCULAR | Status: AC
  Administered 2019-11-24: 50 ug via INTRAVENOUS
  Filled 2019-11-24: qty 2

## 2019-11-24 MED ORDER — LIDOCAINE HCL 1 % IJ SOLN
INTRAMUSCULAR | Status: DC | PRN
  Administered 2019-11-24: 17 mL

## 2019-11-24 MED ORDER — SODIUM CHLORIDE 0.9 % IV SOLN
INTRAVENOUS | Status: AC
  Filled 2019-11-24: qty 1.2

## 2019-11-24 MED ORDER — HEPARIN SODIUM (PORCINE) 1000 UNIT/ML IJ SOLN
INTRAMUSCULAR | Status: AC
  Filled 2019-11-24: qty 1

## 2019-11-24 MED ORDER — PROTAMINE SULFATE 10 MG/ML IV SOLN
INTRAVENOUS | Status: DC | PRN
  Administered 2019-11-24: 50 mg via INTRAVENOUS

## 2019-11-24 MED ORDER — MEPIVACAINE HCL (PF) 2 % IJ SOLN
INTRAMUSCULAR | Status: DC | PRN
  Administered 2019-11-24: 14 mL

## 2019-11-24 MED ORDER — MIDAZOLAM HCL 2 MG/2ML IJ SOLN
INTRAMUSCULAR | Status: AC
  Administered 2019-11-24: 2 mg via INTRAVENOUS
  Filled 2019-11-24: qty 2

## 2019-11-24 MED ORDER — PROPOFOL 10 MG/ML IV BOLUS
INTRAVENOUS | Status: AC
  Filled 2019-11-24: qty 40

## 2019-11-24 MED ORDER — ROPIVACAINE HCL 5 MG/ML IJ SOLN
INTRAMUSCULAR | Status: DC | PRN
  Administered 2019-11-24: 5 mL

## 2019-11-24 MED ORDER — SODIUM CHLORIDE 0.9 % IV SOLN: INTRAVENOUS | Status: DC

## 2019-11-24 MED ORDER — ACETAMINOPHEN 500 MG PO TABS
1000.0000 mg | ORAL_TABLET | Freq: Once | ORAL | Status: AC
  Administered 2019-11-24: 1000 mg via ORAL
  Filled 2019-11-24: qty 2

## 2019-11-24 MED ORDER — DEXAMETHASONE SODIUM PHOSPHATE 10 MG/ML IJ SOLN
INTRAMUSCULAR | Status: AC
  Filled 2019-11-24: qty 1

## 2019-11-24 MED ORDER — PHENYLEPHRINE HCL-NACL 10-0.9 MG/250ML-% IV SOLN
INTRAVENOUS | Status: DC | PRN
  Administered 2019-11-24: 40 ug/min via INTRAVENOUS

## 2019-11-24 MED ORDER — PROTAMINE SULFATE 10 MG/ML IV SOLN
INTRAVENOUS | Status: AC
  Filled 2019-11-24: qty 5

## 2019-11-24 MED ORDER — LIDOCAINE HCL (PF) 1 % IJ SOLN
INTRAMUSCULAR | Status: AC
  Filled 2019-11-24: qty 30

## 2019-11-24 MED ORDER — HEPARIN SOD (PORK) LOCK FLUSH 100 UNIT/ML IV SOLN
INTRAVENOUS | Status: AC
  Filled 2019-11-24: qty 5

## 2019-11-24 MED ORDER — 0.9 % SODIUM CHLORIDE (POUR BTL) OPTIME
TOPICAL | Status: DC | PRN
  Administered 2019-11-24: 1000 mL

## 2019-11-24 MED ORDER — MIDAZOLAM HCL 2 MG/2ML IJ SOLN: 2.0000 mg | Freq: Once | INTRAMUSCULAR | Status: AC

## 2019-11-24 MED ORDER — CHLORHEXIDINE GLUCONATE CLOTH 2 % EX PADS
6.0000 | MEDICATED_PAD | Freq: Every day | CUTANEOUS | Status: DC
  Administered 2019-11-25: 6 via TOPICAL

## 2019-11-24 MED ORDER — CEFAZOLIN SODIUM 1 G IJ SOLR
INTRAMUSCULAR | Status: AC
  Filled 2019-11-24: qty 20

## 2019-11-24 MED ORDER — ONDANSETRON HCL 4 MG/2ML IJ SOLN
INTRAMUSCULAR | Status: AC
  Filled 2019-11-24: qty 2

## 2019-11-24 MED ORDER — FENTANYL CITRATE (PF) 250 MCG/5ML IJ SOLN
INTRAMUSCULAR | Status: DC | PRN
  Administered 2019-11-24: 25 ug via INTRAVENOUS

## 2019-11-24 MED ORDER — STERILE WATER FOR IRRIGATION IR SOLN
Status: DC | PRN
  Administered 2019-11-24: 1000 mL

## 2019-11-24 MED ORDER — CEFAZOLIN SODIUM-DEXTROSE 2-3 GM-%(50ML) IV SOLR
INTRAVENOUS | Status: DC | PRN
  Administered 2019-11-24: 2 g via INTRAVENOUS

## 2019-11-24 MED ORDER — FENTANYL CITRATE (PF) 100 MCG/2ML IJ SOLN: 50.0000 ug | Freq: Once | INTRAMUSCULAR | Status: AC

## 2019-11-24 SURGICAL SUPPLY — 30 items
ARMBAND PINK RESTRICT EXTREMIT (MISCELLANEOUS) ×4 IMPLANT
CANISTER SUCT 3000ML PPV (MISCELLANEOUS) ×2 IMPLANT
CANNULA VESSEL 3MM 2 BLNT TIP (CANNULA) ×2 IMPLANT
CLIP LIGATING EXTRA MED SLVR (CLIP) ×2 IMPLANT
CLIP LIGATING EXTRA SM BLUE (MISCELLANEOUS) ×2 IMPLANT
COVER PROBE W GEL 5X96 (DRAPES) ×2 IMPLANT
COVER WAND RF STERILE (DRAPES) ×2 IMPLANT
DECANTER SPIKE VIAL GLASS SM (MISCELLANEOUS) ×2 IMPLANT
DERMABOND ADVANCED (GAUZE/BANDAGES/DRESSINGS) ×1
DERMABOND ADVANCED .7 DNX12 (GAUZE/BANDAGES/DRESSINGS) ×1 IMPLANT
ELECT REM PT RETURN 9FT ADLT (ELECTROSURGICAL) ×2
ELECTRODE REM PT RTRN 9FT ADLT (ELECTROSURGICAL) ×1 IMPLANT
GLOVE SS BIOGEL STRL SZ 7.5 (GLOVE) ×1 IMPLANT
GLOVE SUPERSENSE BIOGEL SZ 7.5 (GLOVE) ×1
GLOVE SURG SS PI 6.5 STRL IVOR (GLOVE) ×1 IMPLANT
GOWN STRL REIN XL XLG (GOWN DISPOSABLE) ×1 IMPLANT
GOWN STRL REUS W/ TWL LRG LVL3 (GOWN DISPOSABLE) ×3 IMPLANT
GOWN STRL REUS W/TWL LRG LVL3 (GOWN DISPOSABLE) ×3
KIT BASIN OR (CUSTOM PROCEDURE TRAY) ×2 IMPLANT
KIT TURNOVER KIT B (KITS) ×2 IMPLANT
NS IRRIG 1000ML POUR BTL (IV SOLUTION) ×2 IMPLANT
PACK CV ACCESS (CUSTOM PROCEDURE TRAY) ×2 IMPLANT
PAD ARMBOARD 7.5X6 YLW CONV (MISCELLANEOUS) ×4 IMPLANT
SUT PROLENE 6 0 CC (SUTURE) ×2 IMPLANT
SUT VIC AB 3-0 SH 27 (SUTURE) ×2
SUT VIC AB 3-0 SH 27X BRD (SUTURE) ×1 IMPLANT
SUT VIC AB 4-0 PS2 18 (SUTURE) ×1 IMPLANT
TOWEL GREEN STERILE (TOWEL DISPOSABLE) ×2 IMPLANT
UNDERPAD 30X30 (UNDERPADS AND DIAPERS) ×2 IMPLANT
WATER STERILE IRR 1000ML POUR (IV SOLUTION) ×2 IMPLANT

## 2019-11-24 NOTE — Interval H&P Note (Signed)
History and Physical Interval Note:  11/24/2019 12:14 PM  Barry Lewis  has presented today for surgery, with the diagnosis of CHRONIC KIDNEY DISEASE.  The various methods of treatment have been discussed with the patient and family. After consideration of risks, benefits and other options for treatment, the patient has consented to  Procedure(s): LEFT ARM ARTERIOVENOUS (AV) FISTULA CREATION (Left) as a surgical intervention.  The patient's history has been reviewed, patient examined, no change in status, stable for surgery.  I have reviewed the patient's chart and labs.  Questions were answered to the patient's satisfaction.     Ruta Hinds

## 2019-11-24 NOTE — Discharge Instructions (Addendum)
° °  [facility] ° °Discharge Instructions ° °AV Fistula or Graft Surgery for Dialysis Access ° °Please refer to the following instructions for your post-procedure care. Your surgeon or physician assistant will discuss any changes with you. ° °Activity ° °You may drive the day following your surgery, if you are comfortable and no longer taking prescription pain medication. Resume full activity as the soreness in your incision resolves. ° °Bathing/Showering ° °You may shower after you go home. Keep your incision dry for 48 hours. Do not soak in a bathtub, hot tub, or swim until the incision heals completely. You may not shower if you have a hemodialysis catheter. ° °Incision Care ° °Clean your incision with mild soap and water after 48 hours. Pat the area dry with a clean towel. You do not need a bandage unless otherwise instructed. Do not apply any ointments or creams to your incision. You may have skin glue on your incision. Do not peel it off. It will come off on its own in about one week. Your arm may swell a bit after surgery. To reduce swelling use pillows to elevate your arm so it is above your heart. Your doctor will tell you if you need to lightly wrap your arm with an ACE bandage. ° °Diet ° °Resume your normal diet. There are not special food restrictions following this procedure. In order to heal from your surgery, it is CRITICAL to get adequate nutrition. Your body requires vitamins, minerals, and protein. Vegetables are the best source of vitamins and minerals. Vegetables also provide the perfect balance of protein. Processed food has little nutritional value, so try to avoid this. ° °Medications ° °Resume taking all of your medications. If your incision is causing pain, you may take over-the counter pain relievers such as acetaminophen (Tylenol). If you were prescribed a stronger pain medication, please be aware these medications can cause nausea and constipation. Prevent  nausea by taking the medication with a snack or meal. Avoid constipation by drinking plenty of fluids and eating foods with high amount of fiber, such as fruits, vegetables, and grains. Do not take Tylenol if you are taking prescription pain medications. ° ° ° ° °Follow up °Your surgeon may want to see you in the office following your access surgery. If so, this will be arranged at the time of your surgery. ° °Please call us immediately for any of the following conditions: ° °Increased pain, redness, drainage (pus) from your incision site °Fever of 101 degrees or higher °Severe or worsening pain at your incision site °Hand pain or numbness. ° °Reduce your risk of vascular disease: ° °Stop smoking. If you would like help, call [facility] at 1-800-QUIT-NOW (1-800-784-8669) or [place] at 336-586-4000 ° °Manage your cholesterol °Maintain a desired weight °Control your diabetes °Keep your blood pressure down ° °Dialysis ° °It will take several weeks to several months for your new dialysis access to be ready for use. Your surgeon will determine when it is OK to use it. Your nephrologist will continue to direct your dialysis. You can continue to use your Permcath until your new access is ready for use. ° °If you have any questions, please call the office at [phone]. ° °

## 2019-11-24 NOTE — Anesthesia Postprocedure Evaluation (Signed)
Anesthesia Post Note  Patient: Facilities manager  Procedure(s) Performed: [place] (Left Arm Upper)     Patient location during evaluation: PACU Anesthesia Type: MAC and Regional Level of consciousness: awake and alert Pain management: pain level controlled Vital Signs Assessment: post-procedure vital signs reviewed and stable Respiratory status: spontaneous breathing, nonlabored ventilation and respiratory function stable Cardiovascular status: stable and blood pressure returned to baseline Postop Assessment: no apparent nausea or vomiting Anesthetic complications: no    Last Vitals:  Vitals:   11/24/19 1447 11/24/19 1448  BP: (!) 148/84   Pulse: 71 72  Resp: 14 15  Temp:    SpO2: 96% 96%    Last Pain:  Vitals:   11/24/19 1430  TempSrc:   PainSc: Asleep                 Jaydon Avina,W. EDMOND

## 2019-11-24 NOTE — Anesthesia Procedure Notes (Signed)
Procedure Name: MAC Date/Time: 11/24/2019 12:42 PM Performed by: Kathryne Hitch, CRNA Pre-anesthesia Checklist: Patient identified, Suction available, Patient being monitored and Emergency Drugs available Patient Re-evaluated:Patient Re-evaluated prior to induction Oxygen Delivery Method: Simple face mask Preoxygenation: Pre-oxygenation with 100% oxygen Induction Type: IV induction

## 2019-11-24 NOTE — Transfer of Care (Signed)
Immediate Anesthesia Transfer of Care Note  Patient: Barry Lewis  Procedure(s) Performed: [place] (Left Arm Upper)  Patient Location: PACU  Anesthesia Type:MAC and Regional  Level of Consciousness: drowsy and patient cooperative  Airway & Oxygen Therapy: Patient Spontanous Breathing  Post-op Assessment: Report given to RN and Post -op Vital signs reviewed and stable  Post vital signs: Reviewed and stable  Last Vitals:  Vitals Value Taken Time  BP 163/83 11/24/19 1430  Temp    Pulse 65 11/24/19 1433  Resp 8 11/24/19 1433  SpO2 96 % 11/24/19 1433  Vitals shown include unvalidated device data.  Last Pain:  Vitals:   11/24/19 0352  TempSrc: Oral  PainSc:          Complications: No apparent anesthesia complications

## 2019-11-24 NOTE — Anesthesia Preprocedure Evaluation (Addendum)
Anesthesia Evaluation  Patient identified by MRN, date of birth, ID band Patient awake    Reviewed: Allergy & Precautions, H&P , NPO status , Patient's Chart, lab work & pertinent test results, reviewed documented beta blocker date and time   Airway Mallampati: II  TM Distance: >3 FB Neck ROM: Full    Dental no notable dental hx. (+) Dental Advisory Given, Poor Dentition   Pulmonary COPD, Current Smoker,    Pulmonary exam normal breath sounds clear to auscultation       Cardiovascular hypertension, Pt. on medications and Pt. on home beta blockers + Past MI and +CHF   Rhythm:Regular Rate:Normal     Neuro/Psych Bipolar Disorder Schizophrenia Dementia CVA    GI/Hepatic negative GI ROS, Neg liver ROS,   Endo/Other  diabetes, Insulin Dependent  Renal/GU CRFRenal disease  negative genitourinary   Musculoskeletal   Abdominal   Peds  Hematology  (+) Blood dyscrasia, anemia ,   Anesthesia Other Findings   Reproductive/Obstetrics negative OB ROS                            Anesthesia Physical Anesthesia Plan  ASA: III  Anesthesia Plan: MAC and Regional   Post-op Pain Management:    Induction: Intravenous  PONV Risk Score and Plan: 1 and Propofol infusion  Airway Management Planned: Simple Face Mask  Additional Equipment:   Intra-op Plan:   Post-operative Plan:   Informed Consent: I have reviewed the patients History and Physical, chart, labs and discussed the procedure including the risks, benefits and alternatives for the proposed anesthesia with the patient or authorized representative who has indicated his/her understanding and acceptance.     Dental advisory given  Plan Discussed with: CRNA  Anesthesia Plan Comments:        Anesthesia Quick Evaluation

## 2019-11-24 NOTE — Plan of Care (Signed)
  Problem: Education: Goal: Knowledge of General Education information will improve Description Including pain rating scale, medication(s)/side effects and non-pharmacologic comfort measures Outcome: Progressing   

## 2019-11-24 NOTE — Progress Notes (Addendum)
Subjective This morning Mr. Leider is alert and active sitting up in bed in NAD. He feels well and had no overnight events. His fatigue, SOB and lower extremity edema is greatly improved. He denies CP, palpitations, NVDC. He did not use O2 overnight and is actively weaning himself off from the [place].  Objective Physical Exam  Constitutional: He is oriented to person, place, and time. He appears well-developed and well-nourished. He is active and cooperative. Nasal cannula in place.  Cardiovascular: Normal rate and regular rhythm. Exam reveals distant heart sounds.  Respiratory: Effort normal. No respiratory distress. He has no wheezes. He has no rhonchi. He has no rales.  GI: Soft. Bowel sounds are normal. He exhibits no distension. There is no abdominal tenderness. There is no rebound.  Musculoskeletal:        General: No tenderness.     Right lower leg: Edema present.     Left lower leg: Edema (least amount since admission ) present.  Neurological: He is alert and oriented to person, place, and time.  +asterixis less pronounced than previous physical exam  Skin: Skin is warm and dry.  Psychiatric: He has a normal mood and affect. His behavior is normal. Thought content normal.    Intake/Output Summary (Last 24 hours) at 11/24/2019 0841 Last data filed at 11/24/2019 0355 Gross per 24 hour  Intake 1160 ml  Output 4475 ml  Net -3315 ml   Vitals with BMI 11/24/2019 11/24/2019 11/23/2019  Height - - -  Weight - 182 lbs 3 oz -  BMI - 95.63 -  Systolic 875 - 643  Diastolic 78 - 84  Pulse 72 - 80   Results for orders placed or performed during the hospital encounter of 11/20/19 (from the past 24 hour(s))  Glucose, capillary     Status: Abnormal   Collection Time: 11/23/19 11:00 AM  Result Value Ref Range   Glucose-Capillary 179 (H) 70 - 99 mg/dL  Glucose, capillary     Status: Abnormal   Collection Time: 11/23/19  4:18 PM  Result Value Ref Range   Glucose-Capillary 194 (H) 70 - 99 mg/dL   Hepatitis B surface antigen     Status: None   Collection Time: 11/23/19  5:40 PM  Result Value Ref Range   Hepatitis B Surface Ag NON REACTIVE NON REACTIVE  Hepatitis B surface antibody     Status: None   Collection Time: 11/23/19  5:40 PM  Result Value Ref Range   Hep B S Ab NON REACTIVE NON REACTIVE  Hepatitis B core antibody, total     Status: None   Collection Time: 11/23/19  5:40 PM  Result Value Ref Range   Hep B Core Total Ab NON REACTIVE NON REACTIVE  Glucose, capillary     Status: Abnormal   Collection Time: 11/23/19  9:06 PM  Result Value Ref Range   Glucose-Capillary 208 (H) 70 - 99 mg/dL  Renal function panel     Status: Abnormal   Collection Time: 11/24/19  4:40 AM  Result Value Ref Range   Sodium 134 (L) 135 - 145 mmol/L   Potassium 4.1 3.5 - 5.1 mmol/L   Chloride 97 (L) 98 - 111 mmol/L   CO2 22 22 - 32 mmol/L   Glucose, Bld 84 70 - 99 mg/dL   BUN 62 (H) 6 - 20 mg/dL   Creatinine, Ser 5.29 (H) 0.61 - 1.24 mg/dL   Calcium 7.7 (L) 8.9 - 10.3 mg/dL   Phosphorus 4.3 2.5 -  4.6 mg/dL   Albumin 2.1 (L) 3.5 - 5.0 g/dL   GFR calc non Af Amer 11 (L) >60 mL/min   GFR calc Af Amer 13 (L) >60 mL/min   Anion gap 15 5 - 15  CBC with Differential/Platelet     Status: Abnormal   Collection Time: 11/24/19  4:40 AM  Result Value Ref Range   WBC 5.8 4.0 - 10.5 K/uL   RBC 2.65 (L) 4.22 - 5.81 MIL/uL   Hemoglobin 8.5 (L) 13.0 - 17.0 g/dL   HCT 24.7 (L) 39.0 - 52.0 %   MCV 93.2 80.0 - 100.0 fL   MCH 32.1 26.0 - 34.0 pg   MCHC 34.4 30.0 - 36.0 g/dL   RDW 12.5 11.5 - 15.5 %   Platelets 144 (L) 150 - 400 K/uL   nRBC 0.0 0.0 - 0.2 %   Neutrophils Relative % 58 %   Neutro Abs 3.4 1.7 - 7.7 K/uL   Lymphocytes Relative 23 %   Lymphs Abs 1.3 0.7 - 4.0 K/uL   Monocytes Relative 12 %   Monocytes Absolute 0.7 0.1 - 1.0 K/uL   Eosinophils Relative 6 %   Eosinophils Absolute 0.4 0.0 - 0.5 K/uL   Basophils Relative 1 %   Basophils Absolute 0.0 0.0 - 0.1 K/uL   Immature Granulocytes  0 %   Abs Immature Granulocytes 0.02 0.00 - 0.07 K/uL  Surgical pcr screen     Status: None   Collection Time: 11/24/19  5:31 AM   Specimen: Nasal Mucosa; Nasal Swab  Result Value Ref Range   MRSA, PCR NEGATIVE NEGATIVE   Staphylococcus aureus NEGATIVE NEGATIVE  Glucose, capillary     Status: None   Collection Time: 11/24/19  6:16 AM  Result Value Ref Range   Glucose-Capillary 94 70 - 99 mg/dL   Assessment & Plan #ESRD Previous admission 10/15/19-11/03/19 (10/18/19>>rehab) with 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 Medication compliance is questionable given unchanged state of health and failure to thrive since discharge from an extended stage.  Kidney disease continuing to decline to ESRD. Baseline creatinine based on previous admission of upper 5's  s/p HD on 3/11- -Access:currently TDC in [facility] placed 3/10  >>VVS consult for AVF and will have procedure today 3/12.  - renal navigator-  Pt might need SNF placement so she is waiting that out  -metabolic acidosis with bicarb improving: 22(3/12)<<22(3/11) -HD initiated 3/11 with UF -1L. Plan for next dialysis tomorrow 3/13   #Hyperkalemia Improving 4.1 (3/12) -Lokelma 54m given  11/21/19 -RD consulted and met with patient about renal dietary changes -renal diet   #HTN/volume status Patient with poorly controlled hypertension due to unknown compliance of home medications which consist of: amlodipine 10 mg, carvedilol 12.563m- amlodipine 1078mnitially d/c'ed due to BP response to lasix, restarted by primary team due to continued high reading- suspect eventually will be able to be stopped -isosorbide-hydralyzine 20-37.5mg44mD initiated on 3/9 -lasix 160mg69m>>d/c'ed on 3/11 given excellent UOP -strict I&O's   #Anemia of chronic disease Hgb 8.5 (3/12) Ferritin 198/iron88 -monitor daily CBC -Aranesp 150mcg27mkly initiated 3/10  5/02abolic bone disease Improved: Corrected Ca 8.9/phos 6.2  (3/12) -phoslo 1334mg i57mated  -PTH 140 (3/10)  Patient seen and examined, agree with above note with above modifications. Pt has done well with dialysis initiation.  Is nearly 12 kg down so far.  Plan for HD #2 tomorrow-  AVF today.  Have initiated ESA, phosphate binder. Disposition is the  issue-  Pt will likely need SNF-  He is from [facility], MD 11/24/2019

## 2019-11-24 NOTE — Op Note (Signed)
Procedure: Ultrasound left arm, first stage basilic vein transposition fistula  Preoperative diagnosis: End-stage renal disease  Postoperative diagnosis: Same  Anesthesia: Axillary block with local  Operative findings: 3 mm basilic vein, 4 mm brachial artery  Operative details: After obtaining form consent, the patient in the operating.  The patient placed in supine position operating table.  An axillary block had been placed by the anesthesia team prior to proceeding to the room.  Patient's entire left upper extremities prepped and draped in usual sterile fashion.  Ultrasound was used to identify the left basilic vein.  Local anesthesia was infiltrated over this near the antecubital area.  Longitudinal incision is made over the vein carried down through subcutaneous tissues down the level of the vein.  The vein was dissected free circumferentially and small side branches ligated divided with silk ties and clips.  An additional skip incision was made to harvest an additional portion of vein to stretch up to the brachial artery.  Next a longitudinal incision was made over the brachial artery brachial artery was exposed and dissected free circumferentially.  Several small side branches were ligated and divided tween silk ties.  Vesseloops were placed proximal distal on the brachial artery.  The patient was given 5000 units of intravenous heparin.  A subcutaneous tunnel was created connecting the brachial artery incision to the basilic vein incision vein was transected distally and ligated with a 2-0 silk tie.  It was then gently distended with heparinized saline and marked for orientation and brought through the subcutaneous tunnel.  Vesseloops were used to control the artery proximally distally.  Longitude opening was made in the brachial artery and the vein was sewn end of vein to side of artery using a running 6-0 Prolene suture.  Despite completion anastomosis it was for blood backbled and thoroughly  flushed anastomosis was secured clamps released palpable thrill in the fistula immediately.  Hemostasis was obtained with direct pressure and the assistance of 50 mg of protamine.  Subcutaneous tissues of all 3 incisions were then closed with a running 3-0 Vicryl suture.  Skin of all 3 incisions then closed with a 4-0 Vicryl subcuticular stitch.  Dermabond was applied.  Patient tired procedure well and there were no complications.  Incident sponge needle counts correct in the case.  Patient was taken to recovery in stable condition.  Ruta Hinds, MD [facility] Office: [phone]

## 2019-11-24 NOTE — Progress Notes (Signed)
Internal Medicine Attending Note:  I have seen and evaluated this patient and I have discussed the plan of care with the house staff. Please see their note for complete details. I concur with their findings.  Velna Ochs, MD 11/24/2019, 12:28 PM

## 2019-11-24 NOTE — Progress Notes (Signed)
Occupational Therapy Treatment Patient Details Name: Barry Lewis MRN: [id number] DOB: 02-03-1961 Today's Date: 11/24/2019    History of present illness Pt is a 59 yo male presenting with shortness of breath x3 days and reports of a recent fall. Upon admission, pt dx with acute hypoxic resp failure due to CHF exacerbation. Pt PMH includes: CVA, schizoaffective disorder, polysubstance abuse, hyperlipidemia, COPD, CKD III, chronic CHF, and recent hospitalization after a fall-resulting in multiple rib fractures.   OT comments  Patient reports still feeling sleepy however agreeable to OT. Patient require x2 attempts to stand from EOB and min A to power up. Patient knees buckling mildly at sink during UB bathing, min A for safety. After seated rest break and set up A for donning clean gown patient min A in standing to brush his teeth/wash face. Min A to ambulate back to bed with cues for safety with body mechanics. Will continue to follow.   Follow Up Recommendations  SNF    Equipment Recommendations  Other (comment)(defer to next venue)       Precautions / Restrictions Precautions Precautions: Fall Restrictions Weight Bearing Restrictions: No       Mobility Bed Mobility Overal bed mobility: Needs Assistance Bed Mobility: Supine to Sit;Sit to Supine     Supine to sit: Supervision Sit to supine: Supervision      Transfers Overall transfer level: Needs assistance Equipment used: Rolling walker (2 wheeled) Transfers: Sit to/from Stand Sit to Stand: Min assist         General transfer comment: require x2 attempts to stand from edge of bed, verbal cues for safety with body mechanics and min A to power up to standing .    Balance Overall balance assessment: Needs assistance;History of Falls Sitting-balance support: No upper extremity supported;Feet supported Sitting balance-Leahy Scale: Fair Sitting balance - Comments: supervision   Standing balance support: Bilateral upper  extremity supported;During functional activity Standing balance-Leahy Scale: Poor Standing balance comment: reliant on B UE support                           ADL either performed or assessed with clinical judgement   ADL Overall ADL's : Needs assistance/impaired     Grooming: Wash/dry face;Wash/dry hands;Oral care;Standing;Minimal assistance Grooming Details (indicate cue type and reason): min A for safety standing at sink side as patient's knees buckling, patient require seated rest break between bathing and g/h at sink. Upper Body Bathing: Minimal assistance;Standing Upper Body Bathing Details (indicate cue type and reason): standing sink side to wash arms/under arms, limited standing tolerance     Upper Body Dressing : Set up;Sitting Upper Body Dressing Details (indicate cue type and reason): don clean gown                                   Cognition Arousal/Alertness: Awake/alert Behavior During Therapy: WFL for tasks assessed/performed Overall Cognitive Status: History of cognitive impairments - at baseline Area of Impairment: Safety/judgement;Memory                     Memory: Decreased short-term memory   Safety/Judgement: Decreased awareness of safety                         Pertinent Vitals/ Pain       Pain Assessment: No/denies pain  Frequency  Min 2X/week        Progress Toward Goals  OT Goals(current goals can now be found in the care plan section)  Progress towards OT goals: Progressing toward goals  Acute Rehab OT Goals Patient Stated Goal: to get stronger OT Goal Formulation: With patient Time For Goal Achievement: 12/05/19 Potential to Achieve Goals: Good  Plan Discharge plan remains appropriate       AM-PAC OT "6 Clicks" Daily Activity     Outcome Measure   Help from another person eating meals?: None Help from another person taking care of personal grooming?: A Little Help from another  person toileting, which includes using toliet, bedpan, or urinal?: A Little Help from another person bathing (including washing, rinsing, drying)?: A Little Help from another person to put on and taking off regular upper body clothing?: A Little Help from another person to put on and taking off regular lower body clothing?: A Lot 6 Click Score: 18    End of Session Equipment Utilized During Treatment: Rolling walker  OT Visit Diagnosis: Other abnormalities of gait and mobility (R26.89);History of falling (Z91.81);Other symptoms and signs involving cognitive function   Activity Tolerance Patient tolerated treatment well   Patient Left in bed;with call bell/phone within reach;with bed alarm set           Time: 3734-2876 OT Time Calculation (min): 26 min  Charges: OT General Charges $OT Visit: 1 Visit OT Treatments $Self Care/Home Management : 23-37 mins  [place] OT office: [place] 11/24/2019, 12:07 PM

## 2019-11-24 NOTE — Progress Notes (Signed)
Renal Navigator following along for SNF placement in order to make referral for OP HD treatment. CSW and Renal Navigator have been in communication.  Alphonzo Cruise, [place] Renal Navigator [phone]

## 2019-11-24 NOTE — Progress Notes (Addendum)
   Subjective:   Mr. Peak reports he is feeling better today. He continues to have some fatigue and confusion but states it is better than before. He also endorses SOB that is alleviated by supplemental O2.   Objective:  Vital signs in last 24 hours: Vitals:   11/23/19 1941 11/23/19 2105 11/24/19 0135 11/24/19 0352  BP: (!) 151/79 (!) 158/84  (!) 156/78  Pulse: 82 80  72  Resp: 20 20  20   Temp: 99.8 F (37.7 C) 98.6 F (37 C)  98.6 F (37 C)  TempSrc: Oral Oral  Oral  SpO2: 95% 96%  98%  Weight:   82.6 kg   Height:        Physical Exam Vitals and nursing note reviewed.  Constitutional:      General: He is not in acute distress.    Appearance: He is obese.  Cardiovascular:     Rate and Rhythm: Normal rate and regular rhythm.     Heart sounds: Gallop present. S3 sounds present.   Pulmonary:     Effort: No tachypnea, accessory muscle usage or respiratory distress.  Musculoskeletal:     Right lower leg: No edema.     Left lower leg: No edema.  Skin:    General: Skin is warm and dry.  Neurological:     Mental Status: He is alert, oriented to person, place, and time and easily aroused.  Psychiatric:        Mood and Affect: Mood normal.        Behavior: Behavior normal.    Assessment/Plan:  Active Problems:   Acute exacerbation of congestive heart failure (HCC)   Renal insufficiency   Encephalopathy   Cardiac mass   ESRD (end stage renal disease) ([place])  # ESRD # Secondary Hyperparathyroidism #UremicEncephalopathy  Metabolic acidosis resolved. AV fistula vs graft to be placed today by IR.   - Nephrologyfollowing; appreciate their recommendations  - Renal function paneldaily  - Phoslo 1334 mg TID  # Acutely Decompensated HFrEF # Acute Hypoxic Respiratory Failure TTE on 11/20/19 showed EF of 25-30% with severe global reduction of LV with global hypokinesis, bi-atrial enlargement. This is a change from his prior TTE in August of 2020 that showed EF of  45-50%.   Patient's weight continues to down trend, 6 lbs down since yesterday, total of 18 lbs since admission. Good urine output without Lasix on board. Continues to require supplemental oxygen though.   - Telemetry  - Strict in/out  - Daily weights  - Supplemental oxygen PRN - Fluid restricted diet 1500 mL  - Renal function panel daily - Bidil 20-37.5 mg BID   #1st DegreeAV Block - Telemetry - Avoid beta blockers  # Hypertension Improving today. Continue with current medical management.   - Amlodipine 10 mg QD - Bidil 20-37.5 mg BID   # COPD - Duoneb q6h PRN - Incruse QD  #HypervolemicHyponatremia Nearly resolved with continued diuresis.   - Renal function panel  # T2DM  - SSI (moderate)  - Lantus 10 units at bedtime  # Normocytic Anemia: Improving. Continue Aranesp per Nephro # Mitral Valve Mass:Stable  Dispo: Anticipated discharge in approximately 1-2 day(s).   Dr. Jose Persia Internal Medicine PGY-1  Pager: [phone] 11/24/2019, 6:57 AM

## 2019-11-24 NOTE — Anesthesia Procedure Notes (Addendum)
Anesthesia Regional Block: Supraclavicular block   Pre-Anesthetic Checklist: ,, timeout performed, Correct Patient, Correct Site, Correct Laterality, Correct Procedure, Correct Position, site marked, Risks and benefits discussed, pre-op evaluation,  At surgeon's request and post-op pain management  Laterality: Left  Prep: Maximum Sterile Barrier Precautions used, chloraprep       Needles:  Injection technique: Single-shot  Needle Type: Echogenic Stimulator Needle     Needle Length: 5cm  Needle Gauge: 22     Additional Needles:   Procedures:,,,, ultrasound used (permanent image in chart),,,,  Narrative:  Start time: 11/24/2019 12:01 PM End time: 11/24/2019 12:11 PM Injection made incrementally with aspirations every 5 mL. Anesthesiologist: Roderic Palau, MD  Additional Notes: 2% Lidocaine skin wheel. Intercostobrachial block with 5cc of 0.5% Ropiv.

## 2019-11-24 NOTE — Progress Notes (Signed)
Physical Therapy Cancellation Note   11/24/19 1410  PT Visit Information  Last PT Received On 11/24/19  Reason Eval/Treat Not Completed Patient at procedure or test/unavailable. Pt in OR for L UE AV fistula. PT will continue to follow acutely.    Earney Navy, PTA Acute Rehabilitation Services Pager: [phone] Office: [phone]

## 2019-11-25 DIAGNOSIS — Z992 Dependence on renal dialysis: Secondary | ICD-10-CM

## 2019-11-25 DIAGNOSIS — G9349 Other encephalopathy: Secondary | ICD-10-CM

## 2019-11-25 DIAGNOSIS — I059 Rheumatic mitral valve disease, unspecified: Secondary | ICD-10-CM

## 2019-11-25 LAB — GLUCOSE, CAPILLARY
Glucose-Capillary: 151 mg/dL — ABNORMAL HIGH (ref 70–99)
Glucose-Capillary: 210 mg/dL — ABNORMAL HIGH (ref 70–99)
Glucose-Capillary: 210 mg/dL — ABNORMAL HIGH (ref 70–99)
Glucose-Capillary: 203 mg/dL — ABNORMAL HIGH (ref 70–99)

## 2019-11-25 LAB — CBC WITH DIFFERENTIAL/PLATELET
Abs Immature Granulocytes: 0.01 10*3/uL (ref 0.00–0.07)
Basophils Absolute: 0 10*3/uL (ref 0.0–0.1)
Basophils Relative: 0 %
Eosinophils Absolute: 0 10*3/uL (ref 0.0–0.5)
Eosinophils Relative: 1 %
HCT: 23.3 % — ABNORMAL LOW (ref 39.0–52.0)
Hemoglobin: 7.9 g/dL — ABNORMAL LOW (ref 13.0–17.0)
Immature Granulocytes: 0 %
Lymphocytes Relative: 23 %
Lymphs Abs: 1.3 10*3/uL (ref 0.7–4.0)
MCH: 31.6 pg (ref 26.0–34.0)
MCHC: 33.9 g/dL (ref 30.0–36.0)
MCV: 93.2 fL (ref 80.0–100.0)
Monocytes Absolute: 0.5 10*3/uL (ref 0.1–1.0)
Monocytes Relative: 9 %
Neutro Abs: 3.9 10*3/uL (ref 1.7–7.7)
Neutrophils Relative %: 67 %
Platelets: 147 10*3/uL — ABNORMAL LOW (ref 150–400)
RBC: 2.5 MIL/uL — ABNORMAL LOW (ref 4.22–5.81)
RDW: 12.4 % (ref 11.5–15.5)
WBC: 5.8 10*3/uL (ref 4.0–10.5)
nRBC: 0 % (ref 0.0–0.2)

## 2019-11-25 LAB — RENAL FUNCTION PANEL
Albumin: 2.1 g/dL — ABNORMAL LOW (ref 3.5–5.0)
Anion gap: 12 (ref 5–15)
BUN: 61 mg/dL — ABNORMAL HIGH (ref 6–20)
CO2: 22 mmol/L (ref 22–32)
Calcium: 7.7 mg/dL — ABNORMAL LOW (ref 8.9–10.3)
Chloride: 99 mmol/L (ref 98–111)
Creatinine, Ser: 5.35 mg/dL — ABNORMAL HIGH (ref 0.61–1.24)
GFR calc Af Amer: 13 mL/min — ABNORMAL LOW (ref >60)
GFR calc non Af Amer: 11 mL/min — ABNORMAL LOW (ref >60)
Glucose, Bld: 151 mg/dL — ABNORMAL HIGH (ref 70–99)
Phosphorus: 4.1 mg/dL (ref 2.5–4.6)
Potassium: 4.4 mmol/L (ref 3.5–5.1)
Sodium: 133 mmol/L — ABNORMAL LOW (ref 135–145)

## 2019-11-25 LAB — CULTURE, BLOOD (ROUTINE X 2)
Culture: NO GROWTH
Culture: NO GROWTH
Special Requests: ADEQUATE
Special Requests: ADEQUATE

## 2019-11-25 MED ORDER — HEPARIN SODIUM (PORCINE) 1000 UNIT/ML IJ SOLN
INTRAMUSCULAR | Status: AC
  Administered 2019-11-25: 3800 [IU] via INTRAVENOUS_CENTRAL
  Filled 2019-11-25: qty 4

## 2019-11-25 NOTE — Progress Notes (Signed)
Subjective:  S/p first stage BVT yest- due for second HD treatment today- remains in good spirits -  Also with reasonable UOP.  Seen on HD.  Has many questions about SNF-  How to get his truck where it needs to be     Objective Vital signs in last 24 hours: Vitals:   11/25/19 0449 11/25/19 0643 11/25/19 0650 11/25/19 0700  BP: (!) 154/80 (!) 162/92 (!) 165/95 (!) 164/94  Pulse: 76 76 75 77  Resp: 18 14    Temp: 98.2 F (36.8 C) 98.6 F (37 C)    TempSrc: Oral Oral    SpO2: 98% 97%    Weight: 83.1 kg 82.8 kg    Height:       Weight change: -1.401 kg  Intake/Output Summary (Last 24 hours) at 11/25/2019 0277 Last data filed at 11/25/2019 0600 Gross per 24 hour  Intake 610 ml  Output 2150 ml  Net -1540 ml    Assessment/ Plan: Pt is a 59 y.o. yo male with DM, CHF- EF 45%, schizoaffective d/o and advanced CKD who was admitted on 11/20/2019 with volume overload and essentially FTT  Assessment/Plan: 1. Renal-  Advanced CKD with FTT and volume overload.  Felt the best course of action was to begin HD.  TDC and first treatment on 3/11-  First stage BVT yesterday and due to second HD today.  He has a good attitude regarding this.  Want to do HD in [place] however plan for now appears to be for SNF-  Renal navigator involved in finding OP unit   2. HTN/volume-  Has improved a lot with diuretics and HD- weight down 18 pounds.  Cont to challenge with HD.  Anticipate BP will improve as volume does but right now on norvasc 10 and bidil as well.  Has foley and likes it for convenience 3. Anemia- iron stores OK- have started aranesp 150 q week 4. Secondary hyperparathyroidism- on phoslo-  Phos better-  PTH 140 no meds- corrected calcium OK  5. Psych- trazadone/effexor/tegretol 6. Dispo-  Awaiting OP HD placement which will not happen until SNF is sorted out  [place]: Basic Metabolic Panel: Recent Labs  Lab 11/23/19 0657 11/24/19 0440 11/25/19 0343  NA 131* 134* 133*   K 4.7 4.1 4.4  CL 97* 97* 99  CO2 22 22 22   GLUCOSE 112* 84 151*  BUN 80* 62* 61*  CREATININE 6.67* 5.29* 5.35*  CALCIUM 7.5* 7.7* 7.7*  PHOS 6.2* 4.3 4.1   Liver Function Tests: Recent Labs  Lab 11/20/19 0854 11/21/19 0359 11/23/19 0657 11/24/19 0440 11/25/19 0343  AST 19  --   --   --   --   ALT 19  --   --   --   --   ALKPHOS 146*  --   --   --   --   BILITOT 0.5  --   --   --   --   PROT 6.2*  --   --   --   --   ALBUMIN 2.5*   < > 2.2* 2.1* 2.1*   < > = values in this interval not displayed.   No results for input(s): LIPASE, AMYLASE in the last 168 hours. No results for input(s): AMMONIA in the last 168 hours. CBC: Recent Labs  Lab 11/21/19 0359 11/21/19 0359 11/22/19 0405 11/22/19 0405 11/23/19 0504 11/24/19 0440 11/25/19 0343  WBC 7.1   < > 6.2   < >  5.7 5.8 5.8  NEUTROABS  --   --  4.2   < > 3.7 3.4 3.9  HGB 8.9*   < > 7.7*   < > 8.6* 8.5* 7.9*  HCT 26.6*   < > 22.9*   < > 25.3* 24.7* 23.3*  MCV 94.7  --  93.9  --  93.7 93.2 93.2  PLT 228   < > 207   < > 214 144* 147*   < > = values in this interval not displayed.   Cardiac Enzymes: No results for input(s): CKTOTAL, CKMB, CKMBINDEX, TROPONINI in the last 168 hours. CBG: Recent Labs  Lab 11/24/19 1116 11/24/19 1457 11/24/19 1706 11/24/19 2115 11/25/19 0536  GLUCAP 94 119* 214* 303* 151*    Iron Studies: No results for input(s): IRON, TIBC, TRANSFERRIN, FERRITIN in the last 72 hours. Studies/Results: No results found. Medications: Infusions: . sodium chloride    . sodium chloride    . sodium chloride Stopped (11/23/19 0246)  . sodium chloride 300 mL/hr at 11/24/19 1430    Scheduled Medications: . amLODipine  10 mg Oral Daily  . calcium acetate  1,334 mg Oral TID WC  . carbamazepine  600 mg Oral QHS  . Chlorhexidine Gluconate Cloth  6 each Topical Q0600  . Chlorhexidine Gluconate Cloth  6 each Topical Q0600  . darbepoetin (ARANESP) injection - NON-DIALYSIS  150 mcg Subcutaneous Q  Wed-1800  . ferrous sulfate  325 mg Oral Q breakfast  . fluticasone furoate-vilanterol  1 puff Inhalation Daily  . folic acid  1 mg Oral Daily  . gabapentin  300 mg Oral QHS  . heparin  5,000 Units Subcutaneous Q8H  . insulin aspart  0-15 Units Subcutaneous TID WC  . insulin aspart  0-5 Units Subcutaneous QHS  . insulin glargine  10 Units Subcutaneous QHS  . isosorbide-hydrALAZINE  1 tablet Oral TID  . OLANZapine  5 mg Oral QHS  . polyethylene glycol  17 g Oral BID  . sodium chloride flush  3 mL Intravenous Q12H  . traZODone  200 mg Oral QHS  . umeclidinium bromide  1 puff Inhalation Daily  . venlafaxine XR  150 mg Oral Q breakfast    have reviewed scheduled and prn medications.  Physical Exam: General: pressured speech-  NAD Heart: RRR Lungs: mostly clear Abdomen: soft, non tender Extremities: still with dep pitting Dialysis Access: tunneled cath and left upper arm first stage BVT     11/25/2019,7:23 AM  LOS: 5 days

## 2019-11-25 NOTE — Progress Notes (Signed)
Physical Therapy Treatment Patient Details Name: Barry Lewis MRN: [id number] DOB: Jun 01, 1961 Today's Date: 11/25/2019    History of Present Illness Pt is a 59 yo male presenting with shortness of breath x3 days and reports of a recent fall. Upon admission, pt dx with acute hypoxic resp failure due to CHF exacerbation. Pt PMH includes: CVA, schizoaffective disorder, polysubstance abuse, hyperlipidemia, COPD, CKD III, chronic CHF, and recent hospitalization after a fall-resulting in multiple rib fractures.    PT Comments    Pt had returned from HD this am, agreeable to tx. With set up pt able to complete bed mob and transfers with stand by assist/min guard assist. Pt ambulated 1 x 122ft with RW and min guard assist and 1 x 72ft with single tip cane with min a. Some balance deficits noted but no major LOBS, pt was on room air and VSS during tx. Will continue to follow pt acutely and progress as he tolerates.    Follow Up Recommendations  SNF;Supervision/Assistance - 24 hour     Equipment Recommendations  None recommended by PT    Recommendations for Other Services       Precautions / Restrictions Precautions Precautions: Fall Restrictions Weight Bearing Restrictions: No    Mobility  Bed Mobility Overal bed mobility: Needs Assistance Bed Mobility: Supine to Sit;Sit to Supine     Supine to sit: Supervision Sit to supine: Supervision   General bed mobility comments: needs set up and line management  Transfers Overall transfer level: Needs assistance Equipment used: Rolling walker (2 wheeled);Straight cane Transfers: Sit to/from Stand Sit to Stand: Min guard            Ambulation/Gait Ambulation/Gait assistance: Min assist;Min guard Gait Distance (Feet): 180 Feet Assistive device: Rolling walker (2 wheeled);Straight cane Gait Pattern/deviations: Step-through pattern;Decreased stride length;Trunk flexed;Drifts right/left Gait velocity: decreased   General Gait  Details: ambulated 1x 171ft with RW and min guard assist, ambulated 1 x 30ft with STC and min a   Stairs             Wheelchair Mobility    Modified Rankin (Stroke Patients Only)       Balance Overall balance assessment: Needs assistance;History of Falls Sitting-balance support: Feet supported Sitting balance-Leahy Scale: Fair     Standing balance support: During functional activity;Bilateral upper extremity supported;Single extremity supported Standing balance-Leahy Scale: Fair                              Cognition Arousal/Alertness: Awake/alert Behavior During Therapy: WFL for tasks assessed/performed Overall Cognitive Status: No family/caregiver present to determine baseline cognitive functioning                                        Exercises      General Comments General comments (skin integrity, edema, etc.): pt on RA this session      Pertinent Vitals/Pain Pain Assessment: No/denies pain    Home Living                      Prior Function            PT Goals (current goals can now be found in the care plan section) Acute Rehab PT Goals Patient Stated Goal: to get stronger PT Goal Formulation: With patient Time For Goal Achievement: 12/05/19 Potential to Achieve Goals:  Good Progress towards PT goals: Progressing toward goals    Frequency    Min 3X/week      PT Plan Current plan remains appropriate    Co-evaluation              AM-PAC PT "6 Clicks" Mobility   Outcome Measure  Help needed turning from your back to your side while in a flat bed without using bedrails?: A Little Help needed moving from lying on your back to sitting on the side of a flat bed without using bedrails?: A Little Help needed moving to and from a bed to a chair (including a wheelchair)?: A Little Help needed standing up from a chair using your arms (e.g., wheelchair or bedside chair)?: A Little Help needed to walk in  hospital room?: A Little Help needed climbing 3-5 steps with a railing? : A Lot 6 Click Score: 17    End of Session Equipment Utilized During Treatment: Gait belt Activity Tolerance: Patient tolerated treatment well Patient left: in bed;with call bell/phone within reach;with bed alarm set Nurse Communication: Mobility status PT Visit Diagnosis: Difficulty in walking, not elsewhere classified (R26.2);History of falling (Z91.81)     Time: 3094-0768 PT Time Calculation (min) (ACUTE ONLY): 19 min  Charges:  $Gait Training: 8-22 mins                     Horald Chestnut, PT    Delford Field 11/25/2019, 3:48 PM

## 2019-11-25 NOTE — Progress Notes (Addendum)
Subjective:   [name] reports he is doing well today and steadily getting better. He feels he is tolerating HD well. He denies any confusion or dizziness.  [name] states he can tolerate room air while awake but continues to have difficulty breathing at night.  He states he wakes up gasping for air.  He thinks he has been diagnosed with sleep apnea in the past but is unsure.  He is interested in going home and is not thrilled that SNF has been recommended. He is concerned about transportation to and from dialysis center.   Objective:  Vital signs in last 24 hours: Vitals:   11/25/19 0930 11/25/19 0957 11/25/19 1032 11/25/19 1118  BP: (!) 163/83 (!) 163/86 (!) 182/87 (!) 180/87  Pulse: 72 70 79 79  Resp:  14  20  Temp:  98.6 F (37 C)    TempSrc:  Oral    SpO2:  98% 98% 98%  Weight:  80.7 kg    Height:       Physical Exam Constitutional:      General: He is not in acute distress.    Appearance: He is obese.  Cardiovascular:     Rate and Rhythm: Normal rate and regular rhythm.     Heart sounds: Gallop (S3) present.   Pulmonary:     Effort: Pulmonary effort is normal. No respiratory distress.     Breath sounds: No wheezing.  Abdominal:     General: Bowel sounds are normal. There is no distension.     Palpations: Abdomen is soft.     Tenderness: There is no abdominal tenderness.  Neurological:     Mental Status: He is alert and oriented to person, place, and time.     Comments: Improved mentation today  Psychiatric:        Mood and Affect: Mood normal.        Behavior: Behavior normal.    Assessment/Plan:  Principal Problem:   ESRD (end stage renal disease) (HCC) Active Problems:   Congestive heart failure (HCC)   Renal insufficiency   Encephalopathy   Cardiac mass  # ESRD # Secondary Hyperparathyroidism #UremicEncephalopathy  Continues to tolerate HD well with second session today. His mental status nearly at baseline, with no evidence of excessive  fatigue during our conversation.  - Nephrologyfollowing; appreciate their recommendations  - Renal function paneldaily  - Phoslo 1334 mg TID  # Acutely Decompensated HFrEF # Acute Hypoxic Respiratory Failure [name] continues to decrease in weight, now a total of 23 lbs since admission. Less urine output yesterday but is being dialyzed today.  Patient continues to endorse what sounds like PND, however overall respiratory function improved as he no longer requires oxygen during the daytime.  - Telemetry  - Strict in/out  - Daily weights - Supplemental oxygen PRN - Fluid restricted diet 1500 mL  - Bidil 20-37.5 mg BID   # Hypertension Elevated this AM with high of 182/87. He may benefit from increased dose of Bidil, as he is already on maximum dose of Amlodipine. Will follow up his BP after dialysis finishes.   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  # Normocytic Anemia: Improving. Continue Aranesp per Nephro # Mitral Valve Mass:Stable  Dispo: Anticipated discharge pending SNF  placement with dialysis center clipping. If patient declines SNF, clipping will need to be near his home in [place].   Dr. Jose Persia Internal Medicine PGY-1  Pager: [phone] 11/25/2019, 11:54 AM

## 2019-11-26 LAB — GLUCOSE, CAPILLARY
Glucose-Capillary: 120 mg/dL — ABNORMAL HIGH (ref 70–99)
Glucose-Capillary: 267 mg/dL — ABNORMAL HIGH (ref 70–99)
Glucose-Capillary: 153 mg/dL — ABNORMAL HIGH (ref 70–99)
Glucose-Capillary: 212 mg/dL — ABNORMAL HIGH (ref 70–99)

## 2019-11-26 LAB — RENAL FUNCTION PANEL
Albumin: 2.1 g/dL — ABNORMAL LOW (ref 3.5–5.0)
Anion gap: 9 (ref 5–15)
BUN: 45 mg/dL — ABNORMAL HIGH (ref 6–20)
CO2: 24 mmol/L (ref 22–32)
Calcium: 7.9 mg/dL — ABNORMAL LOW (ref 8.9–10.3)
Chloride: 100 mmol/L (ref 98–111)
Creatinine, Ser: 4.12 mg/dL — ABNORMAL HIGH (ref 0.61–1.24)
GFR calc Af Amer: 18 mL/min — ABNORMAL LOW (ref >60)
GFR calc non Af Amer: 15 mL/min — ABNORMAL LOW (ref >60)
Glucose, Bld: 129 mg/dL — ABNORMAL HIGH (ref 70–99)
Phosphorus: 4.4 mg/dL (ref 2.5–4.6)
Potassium: 4.2 mmol/L (ref 3.5–5.1)
Sodium: 133 mmol/L — ABNORMAL LOW (ref 135–145)

## 2019-11-26 MED ORDER — ISOSORB DINITRATE-HYDRALAZINE 20-37.5 MG PO TABS
2.0000 | ORAL_TABLET | Freq: Three times a day (TID) | ORAL | Status: DC
  Administered 2019-11-26 – 2019-11-29 (×8): 2 via ORAL
  Filled 2019-11-26 (×8): qty 2

## 2019-11-26 NOTE — Progress Notes (Signed)
   Subjective: HD#6   Overnight: HD yesterday  Today, Barry Lewis states he is doing well.  He tolerated dialysis without any difficulties.  He denies shortness of breath or any other constitutional symptoms.  Objective:  Vital signs in last 24 hours: Vitals:   11/25/19 1636 11/25/19 1822 11/25/19 1944 11/26/19 0410  BP: (!) 166/78 (!) 158/83 (!) 164/85 (!) 186/89  Pulse: 81 80 81 80  Resp: 20  18 18   Temp: 99 F (37.2 C)  99.3 F (37.4 C) 98.4 F (36.9 C)  TempSrc: Oral  Oral Oral  SpO2: 96%  98% 91%  Weight:    80.7 kg  Height:       Const: In no apparent distress, lying comfortably in bed, conversational Resp: CTA BL, no wheezes, crackles, rhonchi CV: RRR, no murmurs, gallop, rub Ext: No lower extremity edema, skin is warm to touch   Assessment/Plan:  Principal Problem:   ESRD (end stage renal disease) (HCC) Active Problems:   Congestive heart failure (HCC)   Renal insufficiency   Encephalopathy   Cardiac mass   #ESRD # Secondary Hyperparathyroidism #UremicEncephalopathy  - Nephrologyfollowing; appreciate their recommendations  - Renal function paneldaily  - Phoslo 1334 mg TID   # Acutely Decompensated HFrEF # Acute Hypoxic Respiratory Failure He is net negative about 23 pounds since admission.  Total net output of 4.1 L (net -3.5 L).  Currently oxygen saturation is 91% on room air.  - Telemetry  - Strict in/out  - Daily weights - Supplemental oxygen PRN - Fluid restricted diet 1500 mL  - Bidil 20-37.5 mg TID --> 40-75mg  TID   # Hypertension BP in the 180s/80s.  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  # Normocytic Anemia:Improving. Continue Aranesp per Nephro # Mitral Valve  Mass:Stable  Dispo: Anticipated discharge pending SNF placement with dialysis center clipping. If patient declines SNF, clipping will need to be near his home in [place].    Jean Rosenthal, MD 11/26/2019, 6:37 AM Pager: [phone] Internal Medicine Teaching Service

## 2019-11-26 NOTE — Progress Notes (Signed)
Subjective:  HD yest- removed 2000- also still making urine.  BP staying high-  Social and dispo issues-  Needs to go to SNF-  Not happy about it   Objective Vital signs in last 24 hours: Vitals:   11/25/19 1636 11/25/19 1822 11/25/19 1944 11/26/19 0410  BP: (!) 166/78 (!) 158/83 (!) 164/85 (!) 186/89  Pulse: 81 80 81 80  Resp: 20  18 18   Temp: 99 F (37.2 C)  99.3 F (37.4 C) 98.4 F (36.9 C)  TempSrc: Oral  Oral Oral  SpO2: 96%  98% 91%  Weight:    80.7 kg  Height:       Weight change: -2.399 kg  Intake/Output Summary (Last 24 hours) at 11/26/2019 9381 Last data filed at 11/25/2019 2312 Gross per 24 hour  Intake 600 ml  Output 4100 ml  Net -3500 ml    Assessment/ Plan: Pt is a 59 y.o. yo male with DM, CHF- EF 45%, schizoaffective d/o and advanced CKD who was admitted on 11/20/2019 with volume overload and essentially FTT  Assessment/Plan: 1. Renal-  Advanced CKD with FTT and volume overload.  Felt the best course of action was to begin HD.  TDC and first treatment on 3/11-  First stage BVT on 3/12 and due to second HD 3/13.  He has a good attitude regarding this.  Want to do HD in [place] however plan for now appears to be for SNF-  Renal navigator involved in finding OP unit.  Will keep on TTS schedule in house for now   2. HTN/volume-  Has improved a lot with diuretics and HD- weight down 23 pounds.  Cont to challenge with HD.  Anticipate BP will improve as volume does but right now on norvasc 10 and bidil as well.  Has foley and likes it for convenience 3. Anemia- iron stores OK- have started aranesp 150 q week 4. Secondary hyperparathyroidism- on phoslo-  Phos better-  PTH 57 no meds- corrected calcium OK  5. Psych/neuro- trazadone/effexor/tegretol 6. Dispo-  Awaiting OP HD placement which will not happen until SNF is sorted out  Champion: Basic Metabolic Panel: Recent Labs  Lab 11/24/19 0440 11/25/19 0343 11/26/19 0450  NA 134* 133* 133*  K  4.1 4.4 4.2  CL 97* 99 100  CO2 22 22 24   GLUCOSE 84 151* 129*  BUN 62* 61* 45*  CREATININE 5.29* 5.35* 4.12*  CALCIUM 7.7* 7.7* 7.9*  PHOS 4.3 4.1 4.4   Liver Function Tests: Recent Labs  Lab 11/20/19 0854 11/21/19 0359 11/24/19 0440 11/25/19 0343 11/26/19 0450  AST 19  --   --   --   --   ALT 19  --   --   --   --   ALKPHOS 146*  --   --   --   --   BILITOT 0.5  --   --   --   --   PROT 6.2*  --   --   --   --   ALBUMIN 2.5*   < > 2.1* 2.1* 2.1*   < > = values in this interval not displayed.   No results for input(s): LIPASE, AMYLASE in the last 168 hours. No results for input(s): AMMONIA in the last 168 hours. CBC: Recent Labs  Lab 11/21/19 0359 11/21/19 0359 11/22/19 0405 11/22/19 0405 11/23/19 0504 11/24/19 0440 11/25/19 0343  WBC 7.1   < > 6.2   < > 5.7 5.8  5.8  NEUTROABS  --   --  4.2   < > 3.7 3.4 3.9  HGB 8.9*   < > 7.7*   < > 8.6* 8.5* 7.9*  HCT 26.6*   < > 22.9*   < > 25.3* 24.7* 23.3*  MCV 94.7  --  93.9  --  93.7 93.2 93.2  PLT 228   < > 207   < > 214 144* 147*   < > = values in this interval not displayed.   Cardiac Enzymes: No results for input(s): CKTOTAL, CKMB, CKMBINDEX, TROPONINI in the last 168 hours. CBG: Recent Labs  Lab 11/25/19 0536 11/25/19 1115 11/25/19 1632 11/25/19 2133 11/26/19 0604  GLUCAP 151* 210* 210* 203* 120*    Iron Studies: No results for input(s): IRON, TIBC, TRANSFERRIN, FERRITIN in the last 72 hours. Studies/Results: No results found. Medications: Infusions: . sodium chloride    . sodium chloride    . sodium chloride Stopped (11/23/19 0246)  . sodium chloride 300 mL/hr at 11/24/19 1430    Scheduled Medications: . amLODipine  10 mg Oral Daily  . calcium acetate  1,334 mg Oral TID WC  . carbamazepine  600 mg Oral QHS  . Chlorhexidine Gluconate Cloth  6 each Topical Q0600  . Chlorhexidine Gluconate Cloth  6 each Topical Q0600  . darbepoetin (ARANESP) injection - NON-DIALYSIS  150 mcg Subcutaneous Q  Wed-1800  . ferrous sulfate  325 mg Oral Q breakfast  . fluticasone furoate-vilanterol  1 puff Inhalation Daily  . folic acid  1 mg Oral Daily  . gabapentin  300 mg Oral QHS  . heparin  5,000 Units Subcutaneous Q8H  . insulin aspart  0-15 Units Subcutaneous TID WC  . insulin aspart  0-5 Units Subcutaneous QHS  . insulin glargine  10 Units Subcutaneous QHS  . isosorbide-hydrALAZINE  2 tablet Oral TID  . OLANZapine  5 mg Oral QHS  . polyethylene glycol  17 g Oral BID  . sodium chloride flush  3 mL Intravenous Q12H  . traZODone  200 mg Oral QHS  . umeclidinium bromide  1 puff Inhalation Daily  . venlafaxine XR  150 mg Oral Q breakfast    have reviewed scheduled and prn medications.  Physical Exam: General: pressured speech-  NAD Heart: RRR Lungs: mostly clear Abdomen: soft, non tender Extremities: still with dep pitting Dialysis Access: tunneled cath and left upper arm first stage BVT - + thrill     11/26/2019,7:42 AM  LOS: 6 days

## 2019-11-26 NOTE — Progress Notes (Signed)
    Subjective  - POD #2  No left hand complaints   Physical Exam:  Excellent thrill I AVF Palpable left radial pulse       Assessment/Plan:  POD #2  F/u 4-6 weeks with duplex of fistula Will sign off  Wells Joely Losier 11/26/2019 8:44 AM --  Vitals:   11/26/19 0410 11/26/19 0741  BP: (!) 186/89   Pulse: 80   Resp: 18   Temp: 98.4 F (36.9 C)   SpO2: 91% 96%    Intake/Output Summary (Last 24 hours) at 11/26/2019 0844 Last data filed at 11/25/2019 2312 Gross per 24 hour  Intake 600 ml  Output 4100 ml  Net -3500 ml     Laboratory CBC    Component Value Date/Time   WBC 5.8 11/25/2019 0343   HGB 7.9 (L) 11/25/2019 0343   HCT 23.3 (L) 11/25/2019 0343   PLT 147 (L) 11/25/2019 0343    BMET    Component Value Date/Time   NA 133 (L) 11/26/2019 0450   K 4.2 11/26/2019 0450   CL 100 11/26/2019 0450   CO2 24 11/26/2019 0450   GLUCOSE 129 (H) 11/26/2019 0450   BUN 45 (H) 11/26/2019 0450   CREATININE 4.12 (H) 11/26/2019 0450   CALCIUM 7.9 (L) 11/26/2019 0450   CALCIUM 7.8 (L) 10/21/2019 1211   GFRNONAA 15 (L) 11/26/2019 0450   GFRAA 18 (L) 11/26/2019 0450    COAG Lab Results  Component Value Date   INR 1.0 10/13/2019   No results found for: PTT  Antibiotics Anti-infectives (From admission, onward)   Start     Dose/Rate Route Frequency Ordered Stop   11/22/19 1645  ceFAZolin (ANCEF) IVPB 2g/100 mL premix     2 g 200 mL/hr over 30 Minutes Intravenous  Once 11/22/19 1636 11/22/19 1720   11/22/19 1636  ceFAZolin (ANCEF) 2-4 GM/100ML-% IVPB    Note to Pharmacy: Domenick Bookbinder   : cabinet override      11/22/19 1636 11/23/19 0444       V. Leia Alf, M.D., [facility] [place] Office: [phone] Pager:  [phone]

## 2019-11-27 LAB — CBC
HCT: 26.3 % — ABNORMAL LOW (ref 39.0–52.0)
Hemoglobin: 8.8 g/dL — ABNORMAL LOW (ref 13.0–17.0)
MCH: 32 pg (ref 26.0–34.0)
MCHC: 33.5 g/dL (ref 30.0–36.0)
MCV: 95.6 fL (ref 80.0–100.0)
Platelets: 146 10*3/uL — ABNORMAL LOW (ref 150–400)
RBC: 2.75 MIL/uL — ABNORMAL LOW (ref 4.22–5.81)
RDW: 12.8 % (ref 11.5–15.5)
WBC: 5.4 10*3/uL (ref 4.0–10.5)
nRBC: 0 % (ref 0.0–0.2)

## 2019-11-27 LAB — RENAL FUNCTION PANEL
Albumin: 2.1 g/dL — ABNORMAL LOW (ref 3.5–5.0)
Anion gap: 13 (ref 5–15)
BUN: 50 mg/dL — ABNORMAL HIGH (ref 6–20)
CO2: 23 mmol/L (ref 22–32)
Calcium: 8.4 mg/dL — ABNORMAL LOW (ref 8.9–10.3)
Chloride: 100 mmol/L (ref 98–111)
Creatinine, Ser: 4.7 mg/dL — ABNORMAL HIGH (ref 0.61–1.24)
GFR calc Af Amer: 15 mL/min — ABNORMAL LOW (ref >60)
GFR calc non Af Amer: 13 mL/min — ABNORMAL LOW (ref >60)
Glucose, Bld: 123 mg/dL — ABNORMAL HIGH (ref 70–99)
Phosphorus: 5.4 mg/dL — ABNORMAL HIGH (ref 2.5–4.6)
Potassium: 4.1 mmol/L (ref 3.5–5.1)
Sodium: 136 mmol/L (ref 135–145)

## 2019-11-27 LAB — GLUCOSE, CAPILLARY
Glucose-Capillary: 130 mg/dL — ABNORMAL HIGH (ref 70–99)
Glucose-Capillary: 212 mg/dL — ABNORMAL HIGH (ref 70–99)
Glucose-Capillary: 199 mg/dL — ABNORMAL HIGH (ref 70–99)
Glucose-Capillary: 167 mg/dL — ABNORMAL HIGH (ref 70–99)

## 2019-11-27 NOTE — Progress Notes (Signed)
Renal Navigator received call from CSW/A. Hill stating patient has decided he would like to return home with [facility] at discharge and that he has support from neighbor and daughter. She states he has requested a second shift seat is possible.  Renal Navigator working on referral to [facility], as this is the closest OP HD clinic to patient's home address. Per CSW, patient states he has reliable transportation.  Alphonzo Cruise, [place] Renal Navigator [phone]

## 2019-11-27 NOTE — Progress Notes (Signed)
   Subjective:   Barry Lewis reports that he feels weak and sleepy this morning. He denies other complaints at this time.   Objective:  Vital signs in last 24 hours: Vitals:   11/26/19 1448 11/26/19 1949 11/27/19 0056 11/27/19 0500  BP: (!) 162/82 (!) 158/85 (!) 141/68 118/63  Pulse: 89 86 76 69  Resp: 20 20 18 18   Temp: 98.6 F (37 C) 99.2 F (37.3 C) 98.8 F (37.1 C) 98.4 F (36.9 C)  TempSrc: Oral Oral Oral Oral  SpO2: 94% 97% 99% 100%  Weight:    80.5 kg  Height:        Physical Exam Vitals and nursing note reviewed.  Constitutional:      General: He is not in acute distress.    Appearance: He is normal weight.  Cardiovascular:     Rate and Rhythm: Normal rate and regular rhythm.     Heart sounds: No murmur. Gallop (S3, softer than prior examination) present.   Pulmonary:     Effort: Pulmonary effort is normal. No respiratory distress.     Breath sounds: No wheezing or rales.  Musculoskeletal:     Right lower leg: No edema.     Left lower leg: No edema.  Skin:    General: Skin is warm and dry.  Neurological:     General: No focal deficit present.     Mental Status: He is alert, oriented to person, place, and time and easily aroused.  Psychiatric:        Behavior: Behavior is cooperative.    Assessment/Plan:  Principal Problem:   ESRD (end stage renal disease) ([place]) Active Problems:   Congestive heart failure (HCC)   Renal insufficiency   Encephalopathy   Cardiac mass  #ESRD # Secondary Hyperparathyroidism #UremicEncephalopathy Patient continues to endorse weakness and fatigue this AM. Likely secondary to starting dialysis rather than encephalopathy as BUN is improving.   - Nephrologyfollowing; appreciate their recommendations  - Renal function paneldaily  - HD on TThSa - Phoslo 1334 mg TID  # Acutely Decompensated HFrEF # Acute Hypoxic Respiratory Failure Mr. Garron continues to have some night-time SOB requiring 2L supplemental O2. He  would benefit from an outpatient sleep study   - Telemetry  - Strict in/out  - Daily weights - Supplemental oxygen PRN - Fluid restricted diet 1500 mL  - Bidil 40-75 mg BID   # Hypertension Mr. Hagerty's BP continues to be elevated despite increased dose of Bidil yesterday. Will continue to monitor and consider adding a 3rd agent if necessary.   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  # Normocytic Anemia:Improving. Continue Aranesp per Nephro # Mitral Valve Mass:Stable  Dispo: Anticipated discharge pending SNF placement with dialysis center clipping. Will discuss with patient.   Dr. Jose Persia Internal Medicine PGY-1  Pager: [phone] 11/27/2019, 7:13 AM

## 2019-11-27 NOTE — Progress Notes (Signed)
Internal Medicine Attending Note:  I have seen and evaluated this patient and I have discussed the plan of care with the house staff. Please see their note for complete details. I concur with their findings.  Velna Ochs, MD 11/27/2019, 2:19 PM

## 2019-11-27 NOTE — Progress Notes (Addendum)
[facility] Progress Note   Assessment/ Plan:   Pt is a 59 y.o. yo male with DM, CHF- EF 45%, schizoaffective d/o and advanced CKD who was admitted on 11/20/2019 with volume overload and essentially FTT   Assessment/Plan: 1. Renal-  Advanced CKD with FTT and volume overload.  Felt the best course of action was to begin HD.  TDC and first treatment on 3/11-  First stage BVT on 3/12 HD #2 3/13.  He has a good attitude regarding this.  Want to do HD in [place] however plan for now appears to be for SNF-  Renal navigator involved in finding OP unit.  Will keep on TTS schedule in house for now   2. HTN/volume-  Has improved a lot with diuretics and HD- weight down 23 pounds.  Cont to challenge with HD.  Anticipate BP will improve as volume does but right now on norvasc 10 and bidil as well.  Foley out 3. Anemia- iron stores OK- have started aranesp 150 q week 4. Secondary hyperparathyroidism- on phoslo-  Phos better-  PTH 140 no meds- corrected calcium OK  5. Psych/neuro- trazadone/effexor/tegretol 6. Dispo-  Awaiting OP HD placement which will not happen until SNF is sorted out  Subjective:    Seen in room.  No complaints.  BP still pretty high.   Objective:   BP (!) 178/79 (BP Location: Right Arm)   Pulse 88   Temp 98.9 F (37.2 C) (Oral)   Resp 16   Ht 5\' 6"  (1.676 m)   Wt 80.5 kg   SpO2 96%   BMI 28.64 kg/m   Physical Exam: Gen: NAD CVS: RRR Resp: clear Abd: soft nontender NABS Ext: no LE edema ACCESS: R IJ TDC and L 1st stage BVT  Labs: BMET Recent Labs  Lab 11/22/19 0405 11/23/19 0504 11/23/19 0657 11/24/19 0440 11/25/19 0343 11/26/19 0450 11/27/19 0626  NA 127* 130* 131* 134* 133* 133* 136  K 4.8 4.6 4.7 4.1 4.4 4.2 4.1  CL 94* 96* 97* 97* 99 100 100  CO2 20* 21* 22 22 22 24 23   GLUCOSE 102* 105* 112* 84 151* 129* 123*  BUN 77* 79* 80* 62* 61* 45* 50*  CREATININE 6.13* 6.71* 6.67* 5.29* 5.35* 4.12* 4.70*  CALCIUM 7.5* 7.6* 7.5* 7.7* 7.7* 7.9*  8.4*  PHOS 6.3* 6.1* 6.2* 4.3 4.1 4.4 5.4*   CBC Recent Labs  Lab 11/22/19 0405 11/22/19 0405 11/23/19 0504 11/24/19 0440 11/25/19 0343 11/27/19 0626  WBC 6.2   < > 5.7 5.8 5.8 5.4  NEUTROABS 4.2  --  3.7 3.4 3.9  --   HGB 7.7*   < > 8.6* 8.5* 7.9* 8.8*  HCT 22.9*   < > 25.3* 24.7* 23.3* 26.3*  MCV 93.9   < > 93.7 93.2 93.2 95.6  PLT 207   < > 214 144* 147* 146*   < > = values in this interval not displayed.      Medications:    . amLODipine  10 mg Oral Daily  . calcium acetate  1,334 mg Oral TID WC  . carbamazepine  600 mg Oral QHS  . Chlorhexidine Gluconate Cloth  6 each Topical Q0600  . darbepoetin (ARANESP) injection - NON-DIALYSIS  150 mcg Subcutaneous Q Wed-1800  . ferrous sulfate  325 mg Oral Q breakfast  . fluticasone furoate-vilanterol  1 puff Inhalation Daily  . folic acid  1 mg Oral Daily  . gabapentin  300 mg Oral QHS  . heparin  5,000 Units Subcutaneous Q8H  . insulin aspart  0-15 Units Subcutaneous TID WC  . insulin aspart  0-5 Units Subcutaneous QHS  . insulin glargine  10 Units Subcutaneous QHS  . isosorbide-hydrALAZINE  2 tablet Oral TID  . OLANZapine  5 mg Oral QHS  . polyethylene glycol  17 g Oral BID  . sodium chloride flush  3 mL Intravenous Q12H  . traZODone  200 mg Oral QHS  . umeclidinium bromide  1 puff Inhalation Daily  . venlafaxine XR  150 mg Oral Q breakfast     Madelon Lips, MD 11/27/2019, 10:13 AM

## 2019-11-27 NOTE — Plan of Care (Signed)

## 2019-11-27 NOTE — TOC Progression Note (Signed)
Transition of Care [facility]) - Progression Note    Patient Details  Name: Barry Lewis MRN: [id number] Date of Birth: [birth year]  Transition of Care [facility]) CM/SW [place], [place] Phone Number: 11/27/2019, 3:10 PM  Clinical Narrative:     CSW met with patient at bedside to discuss discharge plan and lack of bed offers at this time, patient states he really wants to go home and has support from a neighbor and his daughter. Patient also reports already being set up with home health services. Patient states he feels much better after receiving dialysis and states he has a truck and can drive to and from dialysis, prefers sit time after 11am to ensure he can make it on time. Patient states no DME needs as he has all DME at home. Reports at discharge he will drive his truck home, as it is here in the parking lot.   CSW confirmed with Malachy Mood with [facility] home health patient is active with them, will need home health PT, OT, RN orders at time of discharge.   CSW spoke with renal navigator Terri Piedra to inform of preference of HD sit time after 11 am for her to initiate outpatient HD clipping, patient also identifies having reliable transportation.   Expected Discharge Plan: [place] Barriers to Discharge: Continued Medical Work up  Expected Discharge Plan and Services Expected Discharge Plan: [place] Choice: [place] arrangements for the past 2 months: Single Family Home                           HH Arranged: PT, OT, RN [facility] Agency: [place] Date [place]: 11/27/19 Time [place]: 1509 Representative spoke with at [place]: [place] Determinants of Health ([place]) Interventions    Readmission Risk Interventions No flowsheet data found.

## 2019-11-27 NOTE — Care Management Important Message (Signed)
Important Message  Patient Details  Name: Barry Lewis MRN: [id number] Date of Birth: Oct 10, 1960   Medicare Important Message Given:  Yes     [name] 11/27/2019, 9:16 AM

## 2019-11-27 NOTE — Progress Notes (Signed)
Physical Therapy Treatment Patient Details Name: Barry Lewis MRN: [id number] DOB: 06-04-1961 Today's Date: 11/27/2019    History of Present Illness Pt is a 59 yo male presenting with shortness of breath x3 days and reports of a recent fall. Upon admission, pt dx with acute hypoxic resp failure due to CHF exacerbation. Pt PMH includes: CVA, schizoaffective disorder, polysubstance abuse, hyperlipidemia, COPD, CKD III, chronic CHF, and recent hospitalization after a fall-resulting in multiple rib fractures.    PT Comments    Pt able to increase gait distance but remains unsteady and required cues for rest breaks and safety. Pt continues to have poor safety awareness - cued to take time with tranfers and monitor for scissor gait.  VSS on RA.     Follow Up Recommendations  SNF;Supervision/Assistance - 24 hour     Equipment Recommendations  None recommended by PT    Recommendations for Other Services       Precautions / Restrictions Precautions Precautions: Fall    Mobility  Bed Mobility               General bed mobility comments: in chair  Transfers Overall transfer level: Needs assistance Equipment used: Straight cane Transfers: Sit to/from Stand Sit to Stand: Min guard            Ambulation/Gait Ambulation/Gait assistance: Min guard Gait Distance (Feet): 250 Feet   Gait Pattern/deviations: Step-through pattern;Decreased stride length Gait velocity: decreased   General Gait Details: pt with partial buckling L knee - reports this is his baseline; mild instability; multiple cues to increase BOS and decrease scissor gait; recommend use RW at home   Stairs             Wheelchair Mobility    Modified Rankin (Stroke Patients Only)       Balance Overall balance assessment: Needs assistance;History of Falls Sitting-balance support: Feet supported Sitting balance-Leahy Scale: Good     Standing balance support: During functional activity;Single  extremity supported Standing balance-Leahy Scale: Fair                              Cognition Arousal/Alertness: Awake/alert Behavior During Therapy: WFL for tasks assessed/performed Overall Cognitive Status: No family/caregiver present to determine baseline cognitive functioning                                        Exercises      General Comments        Pertinent Vitals/Pain Pain Assessment: No/denies pain    Home Living                      Prior Function            PT Goals (current goals can now be found in the care plan section) Progress towards PT goals: Progressing toward goals    Frequency           PT Plan Current plan remains appropriate    Co-evaluation              AM-PAC PT "6 Clicks" Mobility   Outcome Measure  Help needed turning from your back to your side while in a flat bed without using bedrails?: None Help needed moving from lying on your back to sitting on the side of a flat bed without using bedrails?: None Help  needed moving to and from a bed to a chair (including a wheelchair)?: None Help needed standing up from a chair using your arms (e.g., wheelchair or bedside chair)?: None Help needed to walk in hospital room?: None Help needed climbing 3-5 steps with a railing? : A Little 6 Click Score: 23    End of Session Equipment Utilized During Treatment: Gait belt Activity Tolerance: Patient tolerated treatment well Patient left: with call bell/phone within reach;with chair alarm set;in chair Nurse Communication: Mobility status PT Visit Diagnosis: Difficulty in walking, not elsewhere classified (R26.2);History of falling (Z91.81)     Time: 9379-0240 PT Time Calculation (min) (ACUTE ONLY): 20 min  Charges:  $Gait Training: 8-22 mins                     Maggie Font, PT Acute Rehab Services Pager [phone] [facility] [phone]    [name] 11/27/2019, 11:08 AM

## 2019-11-27 NOTE — Progress Notes (Signed)
Renal Navigator continues to follow along for disposition plan. Navigator received call from [facility] who states patient still does not have any bed offers at this time and SNF search continues. Renal Navigator unable to request OP HD seat until discharge dispo has been determined. Navigator notes that [facility] clinic to patient's home address.  Alphonzo Cruise, [place] Renal Navigator [phone]

## 2019-11-28 LAB — RENAL FUNCTION PANEL
Albumin: 2.2 g/dL — ABNORMAL LOW (ref 3.5–5.0)
Anion gap: 13 (ref 5–15)
BUN: 56 mg/dL — ABNORMAL HIGH (ref 6–20)
CO2: 20 mmol/L — ABNORMAL LOW (ref 22–32)
Calcium: 8.2 mg/dL — ABNORMAL LOW (ref 8.9–10.3)
Chloride: 102 mmol/L (ref 98–111)
Creatinine, Ser: 4.94 mg/dL — ABNORMAL HIGH (ref 0.61–1.24)
GFR calc Af Amer: 14 mL/min — ABNORMAL LOW (ref >60)
GFR calc non Af Amer: 12 mL/min — ABNORMAL LOW (ref >60)
Glucose, Bld: 128 mg/dL — ABNORMAL HIGH (ref 70–99)
Phosphorus: 5.5 mg/dL — ABNORMAL HIGH (ref 2.5–4.6)
Potassium: 4.6 mmol/L (ref 3.5–5.1)
Sodium: 135 mmol/L (ref 135–145)

## 2019-11-28 LAB — URINALYSIS, ROUTINE W REFLEX MICROSCOPIC
Bilirubin Urine: NEGATIVE
Glucose, UA: 150 mg/dL — AB
Hgb urine dipstick: NEGATIVE
Ketones, ur: NEGATIVE mg/dL
Leukocytes,Ua: NEGATIVE
Nitrite: NEGATIVE
Protein, ur: >=300 mg/dL — AB
Specific Gravity, Urine: 1.008 (ref 1.005–1.030)
pH: 7 (ref 5.0–8.0)

## 2019-11-28 LAB — CBC
HCT: 27.6 % — ABNORMAL LOW (ref 39.0–52.0)
Hemoglobin: 9 g/dL — ABNORMAL LOW (ref 13.0–17.0)
MCH: 31.7 pg (ref 26.0–34.0)
MCHC: 32.6 g/dL (ref 30.0–36.0)
MCV: 97.2 fL (ref 80.0–100.0)
Platelets: 150 10*3/uL (ref 150–400)
RBC: 2.84 MIL/uL — ABNORMAL LOW (ref 4.22–5.81)
RDW: 13 % (ref 11.5–15.5)
WBC: 6.6 10*3/uL (ref 4.0–10.5)
nRBC: 0 % (ref 0.0–0.2)

## 2019-11-28 LAB — GLUCOSE, CAPILLARY
Glucose-Capillary: 108 mg/dL — ABNORMAL HIGH (ref 70–99)
Glucose-Capillary: 163 mg/dL — ABNORMAL HIGH (ref 70–99)
Glucose-Capillary: 119 mg/dL — ABNORMAL HIGH (ref 70–99)

## 2019-11-28 MED ORDER — DARBEPOETIN ALFA 150 MCG/0.3ML IJ SOSY: 150.0000 ug | PREFILLED_SYRINGE | INTRAMUSCULAR | 2 refills | Status: AC

## 2019-11-28 MED ORDER — FUROSEMIDE 80 MG PO TABS
80.0000 mg | ORAL_TABLET | ORAL | Status: DC
  Administered 2019-11-29: 80 mg via ORAL
  Filled 2019-11-28: qty 1

## 2019-11-28 MED ORDER — FUROSEMIDE 80 MG PO TABS: 80.0000 mg | ORAL_TABLET | ORAL | 2 refills | Status: DC

## 2019-11-28 MED ORDER — LOSARTAN POTASSIUM 25 MG PO TABS
25.0000 mg | ORAL_TABLET | Freq: Every evening | ORAL | Status: DC
  Administered 2019-11-28: 25 mg via ORAL

## 2019-11-28 MED ORDER — LOSARTAN POTASSIUM 25 MG PO TABS: 25.0000 mg | ORAL_TABLET | Freq: Every evening | ORAL | 1 refills | Status: AC

## 2019-11-28 MED ORDER — ISOSORB DINITRATE-HYDRALAZINE 20-37.5 MG PO TABS: 2.0000 | ORAL_TABLET | Freq: Three times a day (TID) | ORAL | 3 refills | Status: AC

## 2019-11-28 NOTE — Plan of Care (Signed)

## 2019-11-28 NOTE — Progress Notes (Signed)
Patient has been accepted for OP HD treatment at [facility] in [place], [place] on a MWF schedule with a seat time of 11:55am. He has currently been on a TTS schedule in the hospital and will need HD here today. He is cleared for discharge from a Renal perspective, per Dr. Bennye Alm, after HD and therefore can start in the OP HD clinic tomorrow. Renal Navigator also spoke with Attending/Dr. Charleen Kirks, who states patient is cleared for discharge. Navigator left message for CSW/[facility]. Patient needs to arrive to OP HD clinic tomorrow, Wednesday, 11/29/19 at 10:40am to complete intake paperwork prior to his first HD treatment. Clinic notified. Navigator will send discharge information to clinic when available.  Navigator met with patient at bedside to discuss verbally and provide information regarding OP HD in writing. Patient very appreciative and states readiness for discharge.   Alphonzo Cruise, [place] Renal Navigator  [phone]

## 2019-11-28 NOTE — Progress Notes (Signed)
Pt has not been to HD and has discharge orders. Nurse spoke with HD. Per HD they have him in the list today. Nurse already spoke with pt's sister regarding discharge earlier today and pt wants to drive himself home after HD is completed.  MD is aware.

## 2019-11-28 NOTE — Discharge Summary (Signed)
Name: Quadre Bristol MRN: [id number] DOB: 1961/01/19 59 y.o. PCP: Wannetta Sender, FNP  Date of Admission: 11/20/2019  8:18 AM Date of Discharge:  11/29/2019 Attending Physician: Axel Filler, *  Discharge Diagnosis: 1.  Acute hypoxic respiratory failure 2.  Acute decompensation of HFrEF 3.  End-stage renal disease on hemodialysis 4.  Secondary hyperparathyroidism 5.  Uremic encephalopathy 6.  Hypertension 7.  Type 2 diabetes mellitus 8.  Hypervolemic hyponatremia 9.  First-degree AV block 10.  Dysuria 11.  Chronic normocytic anemia 12.  Mitral Valve Vegetation   Discharge Medications: Allergies as of 11/28/2019       Reactions   Lithium Anaphylaxis   Ace Inhibitors Other (See Comments)   Persistent hyperkalemia; per nephrology patient should not be prescribed.   Angiotensin Receptor Blockers Other (See Comments)   Persistent hyperkalemia; per nephrology patient should not be prescribed.   Ibuprofen Other (See Comments)   Due to kidney issues   Invega [paliperidone] Other (See Comments)   Dysarthria and drooling   Prozac [fluoxetine Hcl]    "makes me want to kill people" per ER report        Medication List     STOP taking these medications    carvedilol 12.5 MG tablet Commonly known as: COREG   hydrALAZINE 100 MG tablet Commonly known as: APRESOLINE   lisinopril 10 MG tablet Commonly known as: ZESTRIL   oxyCODONE 5 MG immediate release tablet Commonly known as: Oxy IR/ROXICODONE       TAKE these medications    acetaminophen 325 MG tablet Commonly known as: TYLENOL Take 2 tablets (650 mg total) by mouth every 6 (six) hours as needed for mild pain.   amLODipine 10 MG tablet Commonly known as: NORVASC Take 1 tablet (10 mg total) by mouth daily.   blood glucose meter kit and supplies Kit Dispense based on patient and insurance preference. Use up to four times daily as directed. (FOR ICD-9 250.00, 250.01).   calcium acetate 667 MG  capsule Commonly known as: PHOSLO Take 2 capsules (1,334 mg total) by mouth 3 (three) times daily with meals.   carbamazepine 300 MG 12 hr capsule Commonly known as: CARBATROL Take 600 mg by mouth at bedtime.   Darbepoetin Alfa 150 MCG/0.3ML Sosy injection Commonly known as: ARANESP Inject 0.3 mLs (150 mcg total) into the skin every Wednesday at 6 PM. Start taking on: November 29, 2019   docusate sodium 100 MG capsule Commonly known as: COLACE Take 1 capsule (100 mg total) by mouth 2 (two) times daily.   ferrous sulfate 325 (65 FE) MG tablet Take 1 tablet (325 mg total) by mouth daily with breakfast.   fluticasone 50 MCG/ACT nasal spray Commonly known as: FLONASE Place 2 sprays into both nostrils daily as needed for allergies or rhinitis.   folic acid 1 MG tablet Commonly known as: FOLVITE Take 1 tablet (1 mg total) by mouth daily.   furosemide 80 MG tablet Commonly known as: LASIX Take 1 tablet (80 mg total) by mouth every Tuesday, Thursday, and Saturday at 6 PM.   gabapentin 600 MG tablet Commonly known as: NEURONTIN Take 0.5 tablets (300 mg total) by mouth at bedtime.   insulin glargine 100 UNIT/ML Solostar Pen Commonly known as: LANTUS Inject 18 Units into the skin daily.   isosorbide-hydrALAZINE 20-37.5 MG tablet Commonly known as: BIDIL Take 2 tablets by mouth 3 (three) times daily.   lidocaine 5 % Commonly known as: LIDODERM Place 2 patches onto the  skin daily. Remove & Discard patch within 12 hours or as directed by MD   losartan 25 MG tablet Commonly known as: COZAAR Take 1 tablet (25 mg total) by mouth every evening.   nitroGLYCERIN 0.4 MG SL tablet Commonly known as: NITROSTAT Place 1 tablet (0.4 mg total) under the tongue every 5 (five) minutes as needed for chest pain.   OLANZapine 5 MG tablet Commonly known as: ZYPREXA Take 1 tablet (5 mg total) by mouth at bedtime.   polyethylene glycol 17 g packet Commonly known as: MIRALAX / GLYCOLAX Take 17  g by mouth daily.   Santyl ointment Generic drug: collagenase Apply 1 application topically daily.   sodium bicarbonate 650 MG tablet Take 2 tablets (1,300 mg total) by mouth 2 (two) times daily.   traZODone 100 MG tablet Commonly known as: DESYREL Take 2 tablets (200 mg total) by mouth at bedtime.   TRELEGY ELLIPTA IN Inhale 1 puff into the lungs daily.   venlafaxine XR 75 MG 24 hr capsule Commonly known as: EFFEXOR-XR Take 3 capsules (225 mg total) by mouth daily with breakfast.               Durable Medical Equipment  (From admission, onward)           Start     Ordered   11/28/19 1034  For home use only DME Tub bench  Once     11/28/19 1034            Disposition and follow-up:   Mr.Braun Lightsey was discharged from [facility] in Good condition.  At the hospital follow up visit please address:  1.    Establishing care with Cardiology  Substance Use Disorder Diabetic medication optimization  Follow up with Nephrology   2.  Labs / imaging needed at time of follow-up: CBC, CMP  3.  Pending labs/ test needing follow-up: None   Follow-up Appointments: Follow-up Information     Fields, Jessy Oto, MD.   Specialties: Vascular Surgery, Cardiology Why: 4-6 weeks. The office will call the patient with an appointment Contact information: [place] [address] [phone]            Hospital Course by problem list: 1.  Acute hypoxic respiratory failure Secondary to acutely decompensated HFrEF, please see below for additional details regarding heart failure.  He was able to be weaned off nasal cannula prior to discharge and was tolerating room air without oxygen desaturation.  2.  Acute decompensation of HFrEF  Likely secondary to recent cocaine use and medication noncompliance.  TTE showed an EF of 25 to 30% with severe global reduction of left ventricle with global hypokinesis and biatrial enlargement this is a  change from his prior TTE in August 2020 that showed EF of 40 to 50%.  Last recorded weight was 83.9 kg and he weighed 91.4 kilograms on admission, indicating 7.5 kg increase in 19 days.  He was initiated on Lasix 80 mg twice daily and transitioned up to 160 mg 3 times daily with good urine output.  Remainder of diuresis was managed with the initiation of hemodialysis.  Regarding medication, he was started on BiDil and titrated to 40-75 mg twice daily.  On discharge, he was also instructed to take Lasix on nondialysis days.  3.  End-stage renal disease on hemodialysis 4.  Secondary hyperparathyroidism 5.  Uremic encephalopathy Day 2 of admission, patient developed encephalopathy that was concerning for uremia.  He had a previous  history of CKD 5, likely secondary to diabetic nephropathy. Nephrology was consulted and was recommending urgent initiation of HD.  On 11/22/2019 a tunneled dialysis catheter was placed by vascular surgery.  He received HD the next day and tolerated well.  On 9/52/8413, a basilic vein fistula was placed.  He was able to be referred to a patient hemodialysis clinic in [place], [place] on a MWF schedule.  Uremic encephalopathy resolved within several days of starting HD, as did his volume overload.  Dry weight on discharge estimated to be between 78-80 kg.    6.  Hypertension On admission, patient was hypertensive at 177/90. Likely has hypervolemia was contributing, as well as his recent cocaine use, as patient adamantly denied missing any medication.  Home medications included amlodipine, lisinopril, Coreg, hydralazine.  Coreg was discontinued due to AV block.  Lisinopril was discontinued.  Amlodipine was titrated up to 10 mg.  Hydralazine was transitioned to Imdur, which was titrated up to maximum dose.  On discharge, his blood pressure had improved to 141/87, however due to difficulty obtaining blood pressure goals, losartan was started prior to discharge.  Medication regimen  on discharge: Amlodipine 10 mg, losartan 25 mg, Imdur 40-75 mg.  7.  Type 2 diabetes mellitus Last A1c in January 2021 was 8.5%.  His home medications include 18 units of Lantus daily.  On discharge, home medication was resumed without adjustment.  Please follow-up outpatient for medication optimization.  8.  Hypervolemic hyponatremia Secondary to delusional effect.  Improved with diuresis.  Sodium levels within normal limits on discharge.  9.  First-degree AV block Per chart review, patient has had a history of this for several years now however no prior EKG within system to compare admission EKG to  His home medications included a beta-blocker, which was restarted on admission, however his heart rate dropped into the low 30s.  Please avoid any future use of beta-blockers.  10.  Dysuria Mr. Shaddix complained of dysuria on admission.  Urinalysis did not show any evidence of cystitis.  Gonorrhea and Chlamydia were checked as well and both negative.  Patient no longer experiencing symptoms on discharge.  11.  Chronic normocytic anemia Likely secondary to chronic kidney disease.  Iron levels and TIBC evaluated; no indication of iron deficiency anemia at this time.  Started on Aranesp 150 once per week nephrology.  12.  Mitral Valve Vegetation  TTE showed 1 x 1 cm oscillating density on the ventricular side of the mitral valve.  Cardiology was consulted and felt this is likely a calcification secondary to chronic renal disease.  No further intervention recommended.   Discharge Vitals:   BP (!) 177/94   Pulse 82   Temp 99 F (37.2 C) (Oral)   Resp 18   Ht _0  (1.676 m)   Wt 80.2 kg Comment: scale b  SpO2 98%   BMI 28.54 kg/m   Pertinent Labs, Studies, and Procedures:   CBC Latest Ref Rng & Units 11/29/2019 11/28/2019 11/27/2019  WBC 4.0 - 10.5 K/uL 6.1 6.6 5.4  Hemoglobin 13.0 - 17.0 g/dL 8.8(L) 9.0(L) 8.8(L)  Hematocrit 39.0 - 52.0 % 26.4(L) 27.6(L) 26.3(L)  Platelets 150 - 400 K/uL  171 150 146(L)   BMP Latest Ref Rng & Units 11/29/2019 11/28/2019 11/27/2019  Glucose 70 - 99 mg/dL 154(H) 128(H) 123(H)  BUN 6 - 20 mg/dL 62(H) 56(H) 50(H)  Creatinine 0.61 - 1.24 mg/dL 5.21(H) 4.94(H) 4.70(H)  Sodium 135 - 145 mmol/L 132(L) 135 136  Potassium  3.5 - 5.1 mmol/L 5.4(H) 4.6 4.1  Chloride 98 - 111 mmol/L 100 102 100  CO2 22 - 32 mmol/L 19(L) 20(L) 23  Calcium 8.9 - 10.3 mg/dL 7.8(L) 8.2(L) 8.4(L)   Lab Results  Component Value Date   CALCIUM 7.8 (L) 11/29/2019   PHOS 6.0 (H) 11/29/2019   Lab Results  Component Value Date   HGBA1C 8.5 (H) 10/13/2019   CXR (11/20/2019)  IMPRESSION: 1. The appearance the chest is favored to reflect evolving congestive heart failure, as above. The possibility of multilobar pneumonia from atypical infection such as viral etiology is not Excluded.  CT Head WO Contrast (11/20/2019) IMPRESSION: 1. No CT evidence for acute intracranial abnormality. 2. Atrophy and chronic small vessel ischemic change of the white Matter  TTE (11/20/2019) IMPRESSIONS  1. Severe global reduction in LV systolic function; mild LVH; severe  biatrial enlargement; moderate MAC; oscillating density (1 x 1 cm) on  ventricular side of posterior MV annulus possibly calcified subvalvular  apparatus; cannot exclude vegetation or  thrombus.  2. Left ventricular ejection fraction, by estimation, is 25 to 30%. The  left ventricle has severely decreased function. The left ventricle  demonstrates global hypokinesis. There is mild left ventricular  hypertrophy. Left ventricular diastolic parameters  are indeterminate.  3. Right ventricular systolic function is normal. The right ventricular  size is normal. There is mildly elevated pulmonary artery systolic  pressure.  4. Left atrial size was severely dilated.  5. Right atrial size was severely dilated.  6. The mitral valve is normal in structure. Trivial mitral valve  regurgitation. No evidence of mitral stenosis.    7. The aortic valve is tricuspid. Aortic valve regurgitation is not  visualized. Mild aortic valve sclerosis is present, with no evidence of  aortic valve stenosis.  8. The inferior vena cava is dilated in size with >50% respiratory  variability, suggesting right atrial pressure of 8 mmHg.    Discharge Instructions: Discharge Instructions     Diet - low sodium heart healthy   Complete by: As directed    Discharge instructions   Complete by: As directed    Mr. Rotert,   It was a pleasure taking care of you here in the hospital.  You were admitted because of heart failure exacerbation.  We initially started you on an IV medicine to help get rid of the fluids however because of your kidney disease, we started dialysis to get rid of more fluids.  Please follow-up with the kidney doctors after discharge from the hospital.  After discharge, you start dialysis every Monday, Wednesday, Friday.  Also, I would like for you to follow-up with your primary doctor.  We started you on several new blood pressure medicines which are listed on the discharge papers.  Take care!   Increase activity slowly   Complete by: As directed        Signed: Dr. Jose Persia Internal Medicine PGY-1  Pager: [phone] 11/28/2019, 11:12 AM

## 2019-11-28 NOTE — Progress Notes (Signed)
[facility] Progress Note   Assessment/ Plan:   Pt is a 59 y.o. yo male with DM, CHF- EF 45%, schizoaffective d/o and advanced CKD who was admitted on 11/20/2019 with volume overload and essentially FTT   Assessment/Plan: 1. Renal-  Advanced CKD with FTT and volume overload.  Felt the best course of action was to begin HD.  TDC and first treatment on 3/11-  First stage BVT on 3/12 HD #2 3/13.  He has a good attitude regarding this.  Want to do HD in [place] however plan for now appears to be for SNF-  Renal navigator involved in finding OP unit.  Will keep on TTS schedule in house for now, has OP seat MWF-- he can do HD today and tomorrow   2. HTN/volume-  Has improved a lot with diuretics and HD- weight down 23 pounds.  Cont to challenge with HD.  Anticipate BP will improve as volume does but right now on norvasc 10 and bidil as well, have added Lasix on non-dialysis days and losartan 25 mg QHS with close attn to K-- will need a weekly K for 2 weeks in HD 3. Anemia- iron stores OK- have started aranesp 150 q week 4. Secondary hyperparathyroidism- on phoslo-  Phos better-  PTH 140 no meds- corrected calcium OK  5. Psych/neuro- trazadone/effexor/tegretol 6. Dispo-  can go from my perspective  Subjective:    For HD today.  Has seat with OP dialysis unit.     Objective:   BP (!) 177/94   Pulse 82   Temp 99 F (37.2 C) (Oral)   Resp 18   Ht 5\' 6"  (1.676 m)   Wt 80.2 kg Comment: scale b  SpO2 98%   BMI 28.54 kg/m   Physical Exam: Gen: NAD CVS: RRR Resp: clear Abd: soft nontender NABS Ext: no LE edema ACCESS: R IJ TDC and L 1st stage BVT  Labs: BMET Recent Labs  Lab 11/23/19 0504 11/23/19 0657 11/24/19 0440 11/25/19 0343 11/26/19 0450 11/27/19 0626 11/28/19 0318  NA 130* 131* 134* 133* 133* 136 135  K 4.6 4.7 4.1 4.4 4.2 4.1 4.6  CL 96* 97* 97* 99 100 100 102  CO2 21* 22 22 22 24 23  20*  GLUCOSE 105* 112* 84 151* 129* 123* 128*  BUN 79* 80* 62* 61* 45*  50* 56*  CREATININE 6.71* 6.67* 5.29* 5.35* 4.12* 4.70* 4.94*  CALCIUM 7.6* 7.5* 7.7* 7.7* 7.9* 8.4* 8.2*  PHOS 6.1* 6.2* 4.3 4.1 4.4 5.4* 5.5*   CBC Recent Labs  Lab 11/22/19 0405 11/22/19 0405 11/23/19 0504 11/23/19 0504 11/24/19 0440 11/25/19 0343 11/27/19 0626 11/28/19 0318  WBC 6.2   < > 5.7   < > 5.8 5.8 5.4 6.6  NEUTROABS 4.2  --  3.7  --  3.4 3.9  --   --   HGB 7.7*   < > 8.6*   < > 8.5* 7.9* 8.8* 9.0*  HCT 22.9*   < > 25.3*   < > 24.7* 23.3* 26.3* 27.6*  MCV 93.9   < > 93.7   < > 93.2 93.2 95.6 97.2  PLT 207   < > 214   < > 144* 147* 146* 150   < > = values in this interval not displayed.      Medications:    . amLODipine  10 mg Oral Daily  . calcium acetate  1,334 mg Oral TID WC  . carbamazepine  600 mg Oral QHS  .  Chlorhexidine Gluconate Cloth  6 each Topical Q0600  . darbepoetin (ARANESP) injection - NON-DIALYSIS  150 mcg Subcutaneous Q Wed-1800  . ferrous sulfate  325 mg Oral Q breakfast  . fluticasone furoate-vilanterol  1 puff Inhalation Daily  . folic acid  1 mg Oral Daily  . [START ON 11/29/2019] furosemide  80 mg Oral Q M,W,F  . gabapentin  300 mg Oral QHS  . heparin  5,000 Units Subcutaneous Q8H  . insulin aspart  0-15 Units Subcutaneous TID WC  . insulin aspart  0-5 Units Subcutaneous QHS  . insulin glargine  10 Units Subcutaneous QHS  . isosorbide-hydrALAZINE  2 tablet Oral TID  . losartan  25 mg Oral QPM  . OLANZapine  5 mg Oral QHS  . polyethylene glycol  17 g Oral BID  . sodium chloride flush  3 mL Intravenous Q12H  . traZODone  200 mg Oral QHS  . umeclidinium bromide  1 puff Inhalation Daily  . venlafaxine XR  150 mg Oral Q breakfast     Madelon Lips, MD 11/28/2019, 10:18 AM

## 2019-11-28 NOTE — Progress Notes (Signed)
   Subjective:   Barry Lewis was examined sitting in the bedside recliner and he reports that he feels energetic this morning. He denies any additional SOB, chest pain, leg swelling.   Objective:  Vital signs in last 24 hours: Vitals:   11/27/19 0919 11/27/19 1500 11/27/19 2000 11/28/19 0353  BP: (!) 178/79 (!) 166/83 (!) 160/83 (!) 171/84  Pulse: 88 84 82 82  Resp: 16 16 18 18   Temp: 98.9 F (37.2 C) 98.7 F (37.1 C) 98.2 F (36.8 C) 99 F (37.2 C)  TempSrc: Oral Oral Oral Oral  SpO2: 96% 97% 98% 100%  Weight:    80.2 kg  Height:       Physical Exam Vitals and nursing note reviewed.  Constitutional:      General: He is not in acute distress.    Appearance: He is obese.  Cardiovascular:     Rate and Rhythm: Normal rate and regular rhythm.     Heart sounds: No murmur. No gallop.   Pulmonary:     Effort: Pulmonary effort is normal. No respiratory distress.     Breath sounds: Normal breath sounds.  Skin:    General: Skin is warm.  Neurological:     Mental Status: He is alert and oriented to person, place, and time.     Comments: Significantly improved mentation this AM. Appears back at baseline.   Psychiatric:        Mood and Affect: Mood normal.        Behavior: Behavior normal.    Assessment/Plan:  Principal Problem:   ESRD (end stage renal disease) (HCC) Active Problems:   Congestive heart failure (HCC)   Renal insufficiency   Encephalopathy   Cardiac mass  #ESRD # Secondary Hyperparathyroidism #UremicEncephalopathy(resolved) Mentation improving with continued HD. Will discharge today as he was successfully clipped to a center near his home.   - Nephrologyfollowing; appreciate their recommendations  - HD on TThSa while admitted, MWF on discharge  - Phoslo 1334 mg TID  # Acutely Decompensated HFrEF # Acute Hypoxic Respiratory Failure (resolved)  Dry weight achieved with Lasix initially but now on HD, so Lasix was discontined. Patient is euvolemic  today.  Of note, patient did have a previous ischemic work up in 10/2018 that included a negative Lexi-scan.   - Bidil 40-75 mg BID - Follow up with OP cardiology recommended, he would benefit from a HF clinic  # Hypertension Continues to be under-controlled despite max-ing out Amlodipine and Imdur, as well as initiating HD. Nephrology has recommended Losartan 25 mg daily and Lasix on non-HD days. He will need consistent monitoring of his electrolytes with these additions.   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  # Normocytic Anemia:Improving. Continue Aranesp per Nephro # Mitral Valve Mass:Stable  Dispo: Home with HH, today. Dialysis center has been established with a MWF schedule.   Dr. Jose Persia Internal Medicine PGY-1  Pager: [phone] 11/28/2019, 6:48 AM

## 2019-11-28 NOTE — Progress Notes (Signed)
Occupational Therapy Treatment Patient Details Name: Barry Lewis MRN: [id number] DOB: November 03, 1960 Today's Date: 11/28/2019    History of present illness Pt is a 59 yo male presenting with shortness of breath x3 days and reports of a recent fall. Upon admission, pt dx with acute hypoxic resp failure due to CHF exacerbation. Pt PMH includes: CVA, schizoaffective disorder, polysubstance abuse, hyperlipidemia, COPD, CKD III, chronic CHF, and recent hospitalization after a fall-resulting in multiple rib fractures.   OT comments  Pt making progress in therapy, demonstrating improved activity tolerance. Pt demo good recall of log rolling technique for bed mobility with 0 cues required. Educated/instructed pt on safety strategies, fall prevention, and energy conservation during ADLs, transfers, and mobility with fair understanding and follow through. Initially utilized cane, however noted several instances of retro LOB with pt requiring min assist to self-correct. Switched to RW, noting improved balance with 0 instances of LOB. Pt unsteady on feet throughout, noting slight buckling of BLEs in standing. Pt tolerated standing 1 x 5 min at the sink to complete grooming/hygiene tasks with min guard to ensure balance and safety. Pt able to ambulate to/from bathroom with RW and min guard. Pt completed toileting task with min guard and cues for safety. OT will continue to follow acutely.    Follow Up Recommendations  SNF;Supervision/Assistance - 24 hour. If pt refuses SNF and discharges home he will require [facility] OT and 24/7 assist.    Equipment Recommendations  Other (comment)(TBD) If pt refuses SNF and discharges home he will require a tub transfer bench.   Recommendations for Other Services      Precautions / Restrictions Precautions Precautions: Fall Restrictions Weight Bearing Restrictions: No       Mobility Bed Mobility Overal bed mobility: Needs Assistance Bed Mobility: Rolling;Sidelying to  Sit Rolling: Supervision Sidelying to sit: Supervision;HOB elevated(use of bed rail)       General bed mobility comments: Pt demo good recall and use of log roll technique for bed mobility.   Transfers Overall transfer level: Needs assistance Equipment used: Rolling walker (2 wheeled);Straight cane Transfers: Sit to/from Stand Sit to Stand: Min guard;Min assist         General transfer comment: Pt required min assist for balance when using cane. Switched over to RW with improved balance noted.     Balance Overall balance assessment: Needs assistance;History of Falls Sitting-balance support: Feet supported Sitting balance-Leahy Scale: Good     Standing balance support: Bilateral upper extremity supported;During functional activity Standing balance-Leahy Scale: Fair Standing balance comment: Attempted use of cane, however several instances of LOB with pt requiring assist to self-correct. Switched over to RW with improved balance noted.                            ADL either performed or assessed with clinical judgement   ADL Overall ADL's : Needs assistance/impaired     Grooming: Wash/dry hands;Wash/dry face;Oral care;Min guard;Standing                   Toilet Transfer: Min guard;Ambulation;Regular Toilet;Grab bars;RW   Toileting- Water quality scientist and Hygiene: Min guard;Sit to/from stand       Functional mobility during ADLs: Min guard;Rolling walker General ADL Comments: Pt able to ambulate to/from bathroom with RW and min guard. Noted 0 instances of LOB, however pt is unsteady on feet with poor safety awareness. BLEs weak and semi-buckling during mobility.      Vision  Perception     Praxis      Cognition Arousal/Alertness: Awake/alert Behavior During Therapy: WFL for tasks assessed/performed Overall Cognitive Status: No family/caregiver present to determine baseline cognitive functioning Area of Impairment:  Safety/judgement;Awareness;Problem solving                         Safety/Judgement: Decreased awareness of safety Awareness: Intellectual Problem Solving: Requires verbal cues General Comments: Poor insight into extent of deficits. Cues for safety and to slow pace.         Exercises     Shoulder Instructions       General Comments Educated pt on safety strategies, fall prevention, and energy conservation techniques with fair understanding.     Pertinent Vitals/ Pain       Pain Assessment: 0-10 Pain Score: 8  Pain Location: low back Pain Descriptors / Indicators: Aching Pain Intervention(s): Monitored during session;Repositioned  Home Living                                          Prior Functioning/Environment              Frequency           Progress Toward Goals  OT Goals(current goals can now be found in the care plan section)  Progress towards OT goals: Progressing toward goals  ADL Goals Pt Will Perform Lower Body Dressing: with modified independence;sit to/from stand;sitting/lateral leans Pt Will Transfer to Toilet: with modified independence;ambulating;regular height toilet Pt Will Perform Tub/Shower Transfer: Tub transfer;with modified independence;ambulating;rolling walker Additional ADL Goal #1: Patient will identify 3 energy conservation strategies to implement at home to minimize risk of falls. Additional ADL Goal #2: Patient will demonstrate self monitoring with less than 25% cues during ADLs to minimize risk of falls during self care.  Plan Discharge plan remains appropriate    Co-evaluation                 AM-PAC OT "6 Clicks" Daily Activity     Outcome Measure   Help from another person eating meals?: None Help from another person taking care of personal grooming?: A Little Help from another person toileting, which includes using toliet, bedpan, or urinal?: A Little Help from another person bathing  (including washing, rinsing, drying)?: A Little Help from another person to put on and taking off regular upper body clothing?: A Little Help from another person to put on and taking off regular lower body clothing?: A Lot 6 Click Score: 18    End of Session Equipment Utilized During Treatment: Gait belt;Rolling walker  OT Visit Diagnosis: Other abnormalities of gait and mobility (R26.89);History of falling (Z91.81);Muscle weakness (generalized) (M62.81)   Activity Tolerance No increased pain;Patient limited by fatigue   Patient Left in chair;with call bell/phone within reach;with chair alarm set   Nurse Communication Mobility status        Time: [phone] OT Time Calculation (min): 25 min  Charges: OT General Charges $OT Visit: 1 Visit OT Treatments $Self Care/Home Management : 8-22 mins $Therapeutic Activity: 8-22 mins  Mauri Brooklyn OTR/L [phone]   Mauri Brooklyn 11/28/2019, 8:29 AM

## 2019-11-29 DIAGNOSIS — Z886 Allergy status to analgesic agent status: Secondary | ICD-10-CM

## 2019-11-29 DIAGNOSIS — I330 Acute and subacute infective endocarditis: Secondary | ICD-10-CM

## 2019-11-29 DIAGNOSIS — Z888 Allergy status to other drugs, medicaments and biological substances status: Secondary | ICD-10-CM

## 2019-11-29 LAB — RENAL FUNCTION PANEL
Albumin: 2.1 g/dL — ABNORMAL LOW (ref 3.5–5.0)
Anion gap: 13 (ref 5–15)
BUN: 62 mg/dL — ABNORMAL HIGH (ref 6–20)
CO2: 19 mmol/L — ABNORMAL LOW (ref 22–32)
Calcium: 7.8 mg/dL — ABNORMAL LOW (ref 8.9–10.3)
Chloride: 100 mmol/L (ref 98–111)
Creatinine, Ser: 5.21 mg/dL — ABNORMAL HIGH (ref 0.61–1.24)
GFR calc Af Amer: 13 mL/min — ABNORMAL LOW (ref >60)
GFR calc non Af Amer: 11 mL/min — ABNORMAL LOW (ref >60)
Glucose, Bld: 154 mg/dL — ABNORMAL HIGH (ref 70–99)
Phosphorus: 6 mg/dL — ABNORMAL HIGH (ref 2.5–4.6)
Potassium: 5.4 mmol/L — ABNORMAL HIGH (ref 3.5–5.1)
Sodium: 132 mmol/L — ABNORMAL LOW (ref 135–145)

## 2019-11-29 LAB — CBC
HCT: 26.4 % — ABNORMAL LOW (ref 39.0–52.0)
Hemoglobin: 8.8 g/dL — ABNORMAL LOW (ref 13.0–17.0)
MCH: 32.2 pg (ref 26.0–34.0)
MCHC: 33.3 g/dL (ref 30.0–36.0)
MCV: 96.7 fL (ref 80.0–100.0)
Platelets: 171 10*3/uL (ref 150–400)
RBC: 2.73 MIL/uL — ABNORMAL LOW (ref 4.22–5.81)
RDW: 13.2 % (ref 11.5–15.5)
WBC: 6.1 10*3/uL (ref 4.0–10.5)
nRBC: 0 % (ref 0.0–0.2)

## 2019-11-29 LAB — GLUCOSE, CAPILLARY
Glucose-Capillary: 150 mg/dL — ABNORMAL HIGH (ref 70–99)
Glucose-Capillary: 202 mg/dL — ABNORMAL HIGH (ref 70–99)

## 2019-11-29 MED ORDER — OLANZAPINE 5 MG PO TABS: 5.0000 mg | ORAL_TABLET | Freq: Every day | ORAL | 1 refills | Status: AC

## 2019-11-29 MED ORDER — SODIUM ZIRCONIUM CYCLOSILICATE 5 G PO PACK
5.0000 g | PACK | Freq: Once | ORAL | Status: DC
  Filled 2019-11-29 (×2): qty 1

## 2019-11-29 MED ORDER — TRAZODONE HCL 100 MG PO TABS: 200.0000 mg | ORAL_TABLET | Freq: Every day | ORAL | 1 refills | Status: AC

## 2019-11-29 MED ORDER — CARBAMAZEPINE ER 300 MG PO CP12: 600.0000 mg | ORAL_CAPSULE | Freq: Every day | ORAL | 3 refills | Status: AC

## 2019-11-29 MED ORDER — VENLAFAXINE HCL ER 75 MG PO CP24: 225.0000 mg | ORAL_CAPSULE | Freq: Every day | ORAL | 2 refills | Status: AC

## 2019-11-29 MED ORDER — AMLODIPINE BESYLATE 10 MG PO TABS: 10.0000 mg | ORAL_TABLET | Freq: Every day | ORAL | 3 refills | Status: AC

## 2019-11-29 MED ORDER — CALCIUM ACETATE (PHOS BINDER) 667 MG PO CAPS: 1334.0000 mg | ORAL_CAPSULE | Freq: Three times a day (TID) | ORAL | 3 refills | Status: AC

## 2019-11-29 MED ORDER — DOCUSATE SODIUM 100 MG PO CAPS: 100.0000 mg | ORAL_CAPSULE | Freq: Two times a day (BID) | ORAL | 0 refills | Status: AC

## 2019-11-29 MED ORDER — SODIUM BICARBONATE 650 MG PO TABS: 1300.0000 mg | ORAL_TABLET | Freq: Two times a day (BID) | ORAL | 3 refills | Status: AC

## 2019-11-29 MED ORDER — HEPARIN SODIUM (PORCINE) 1000 UNIT/ML IJ SOLN
INTRAMUSCULAR | Status: AC
  Filled 2019-11-29: qty 4

## 2019-11-29 MED ORDER — FOLIC ACID 1 MG PO TABS: 1.0000 mg | ORAL_TABLET | Freq: Every day | ORAL | 0 refills | Status: AC

## 2019-11-29 NOTE — Progress Notes (Signed)
[facility] Progress Note   Assessment/ Plan:   Pt is a 59 y.o. yo male with DM, CHF- EF 45%, schizoaffective d/o and advanced CKD who was admitted on 11/20/2019 with volume overload and essentially FTT   Assessment/Plan: 1. Renal-  Advanced CKD with FTT and volume overload.  Felt the best course of action was to begin HD.  TDC and first treatment on 3/11-  First stage BVT on 3/12 HD #2 3/13.  He has a good attitude regarding this.  Want to do HD in [place] however plan for now appears to be for SNF-  Renal navigator involved in finding OP unit.  HD today 3/17, got bumped from sched yesterday. 2. HTN/volume-  Has improved a lot with diuretics and HD- weight down.  Cont to challenge with HD.  Anticipate BP will improve as volume does but right now on norvasc 10 and bidil as well, have added Lasix on non-dialysis days and losartan 25 mg QHS with close attn to K-- will need a weekly K for 2 weeks in HD 3. Anemia- iron stores OK- have started aranesp 150 q week 4. Secondary hyperparathyroidism- on phoslo-  Phos better-  PTH 140 no meds- corrected calcium OK  5. Psych/neuro- trazadone/effexor/tegretol 6. Dispo-  can go from my perspective  Subjective:    HD today, got bumped from sched yesterday.     Objective:   BP (!) 163/91 (BP Location: Right Arm)   Pulse 90   Temp 97.8 F (36.6 C) (Oral)   Resp 12   Ht 5\' 6"  (1.676 m)   Wt 81.6 kg   SpO2 99%   BMI 29.04 kg/m   Physical Exam: Gen: NAD CVS: RRR Resp: clear Abd: soft nontender NABS Ext: no LE edema ACCESS: R IJ TDC and L 1st stage BVT  Labs: BMET Recent Labs  Lab 11/23/19 0657 11/24/19 0440 11/25/19 0343 11/26/19 0450 11/27/19 0626 11/28/19 0318 11/29/19 0541  NA 131* 134* 133* 133* 136 135 132*  K 4.7 4.1 4.4 4.2 4.1 4.6 5.4*  CL 97* 97* 99 100 100 102 100  CO2 22 22 22 24 23  20* 19*  GLUCOSE 112* 84 151* 129* 123* 128* 154*  BUN 80* 62* 61* 45* 50* 56* 62*  CREATININE 6.67* 5.29* 5.35* 4.12* 4.70*  4.94* 5.21*  CALCIUM 7.5* 7.7* 7.7* 7.9* 8.4* 8.2* 7.8*  PHOS 6.2* 4.3 4.1 4.4 5.4* 5.5* 6.0*   CBC Recent Labs  Lab 11/23/19 0504 11/23/19 0504 11/24/19 0440 11/24/19 0440 11/25/19 0343 11/27/19 0626 11/28/19 0318 11/29/19 0541  WBC 5.7   < > 5.8   < > 5.8 5.4 6.6 6.1  NEUTROABS 3.7  --  3.4  --  3.9  --   --   --   HGB 8.6*   < > 8.5*   < > 7.9* 8.8* 9.0* 8.8*  HCT 25.3*   < > 24.7*   < > 23.3* 26.3* 27.6* 26.4*  MCV 93.7   < > 93.2   < > 93.2 95.6 97.2 96.7  PLT 214   < > 144*   < > 147* 146* 150 171   < > = values in this interval not displayed.      Medications:    . amLODipine  10 mg Oral Daily  . calcium acetate  1,334 mg Oral TID WC  . carbamazepine  600 mg Oral QHS  . Chlorhexidine Gluconate Cloth  6 each Topical Q0600  . darbepoetin (ARANESP) injection - NON-DIALYSIS  150 mcg Subcutaneous Q Wed-1800  . ferrous sulfate  325 mg Oral Q breakfast  . fluticasone furoate-vilanterol  1 puff Inhalation Daily  . folic acid  1 mg Oral Daily  . furosemide  80 mg Oral Q M,W,F  . gabapentin  300 mg Oral QHS  . heparin  5,000 Units Subcutaneous Q8H  . insulin aspart  0-15 Units Subcutaneous TID WC  . insulin aspart  0-5 Units Subcutaneous QHS  . insulin glargine  10 Units Subcutaneous QHS  . isosorbide-hydrALAZINE  2 tablet Oral TID  . losartan  25 mg Oral QPM  . OLANZapine  5 mg Oral QHS  . polyethylene glycol  17 g Oral BID  . sodium chloride flush  3 mL Intravenous Q12H  . sodium zirconium cyclosilicate  5 g Oral Once  . traZODone  200 mg Oral QHS  . umeclidinium bromide  1 puff Inhalation Daily  . venlafaxine XR  150 mg Oral Q breakfast     Madelon Lips, MD 11/29/2019, 10:12 AM

## 2019-11-29 NOTE — Progress Notes (Signed)
   Subjective:   No acute complaints this morning. Patient is looking forward to going home today. He was wondering about directions to the HD center, as well as arranging pick up of his new medications. His current pharmacy pre-packages his medications into individual blister packets that he always takes as instructed.    Objective:  Vital signs in last 24 hours: Vitals:   11/28/19 0849 11/28/19 1210 11/28/19 2012 11/29/19 0541  BP: (!) 177/94 (!) 187/103 (!) 170/99 (!) 150/83  Pulse: 82 87 90 78  Resp:  18 20 20   Temp:  98 F (36.7 C) 99.3 F (37.4 C) 98.7 F (37.1 C)  TempSrc:   Oral Oral  SpO2:  97% 91% 99%  Weight:    81.1 kg  Height:        Physical Exam Vitals and nursing note reviewed.  Constitutional:      General: He is not in acute distress.    Appearance: He is normal weight.  Pulmonary:     Effort: Pulmonary effort is normal. No respiratory distress.  Skin:    General: Skin is warm and dry.     Comments: Port without erythema or bleeding.  Neurological:     General: No focal deficit present.     Mental Status: He is alert and oriented to person, place, and time.  Psychiatric:        Mood and Affect: Mood normal.        Behavior: Behavior normal.    Assessment/Plan:  Principal Problem:   ESRD (end stage renal disease) (HCC) Active Problems:   Congestive heart failure (HCC)   Renal insufficiency   Encephalopathy   Cardiac mass  #ESRD # Secondary Hyperparathyroidism #UremicEncephalopathy(resolved) Discharge today after HD session. Patient was not able to have HD as planned yesterday due to scheduling.  - Nephrologyfollowing; appreciate their recommendations  - HD on MWF on discharge  - Phoslo 1334 mg TID  # Acutely Decompensated HFrEF # Acute Hypoxic Respiratory Failure (resolved)  No longer requiring supplemental oxygen.   - Bidil40-75mg  BID - Follow up with OP cardiology recommended, he would benefit from a HF clinic  #  Hypertension Improving today with the addition of Losartan and Lasix (on non-HD days). Recommend close outpatient follow up.   - Amlodipine 10 mg QD - Bidil40-75mg  BID  - Losartan 25 mg QD - Lasix 80 mg T-Th-Sa  # T2DM  - SSI (moderate) while admitted  - Lantus 10 units at bedtime  Dispo: Home with HH, today. Dialysis center has been established with a MWF schedule.   Dr. Jose Persia Internal Medicine PGY-1  Pager: [phone] 11/29/2019, 7:14 AM

## 2019-11-29 NOTE — Progress Notes (Signed)
Renal Navigator informed OP HD clinic/[facility] that patient will not be there to start today as discussed yesterday. He was bumped from the schedule yesterday and is having HD here this morning.  Renal Navigator faxed discharge summary and today's Renal note to OP HD clinic to provide continuity of care. He will start in the clinic on Friday, 12/01/19. Patient updated and aware of plan. Navigator asked HD unit secretary to print directions from patient's home to OP HD clinic for him.  Alphonzo Cruise, JAARS Renal Navigator [phone]

## 2019-11-29 NOTE — Progress Notes (Signed)
Occupational Therapy Treatment Patient Details Name: Barry Lewis MRN: [id number] DOB: 01-13-1961 Today's Date: 11/29/2019    History of present illness Pt is a 59 yo male presenting with shortness of breath x3 days and reports of a recent fall. Upon admission, pt dx with acute hypoxic resp failure due to CHF exacerbation. Pt PMH includes: CVA, schizoaffective disorder, polysubstance abuse, hyperlipidemia, COPD, CKD III, chronic CHF, and recent hospitalization after a fall-resulting in multiple rib fractures.   OT comments  Pt making steady progress towards OT goals this session. Session focus on functional mobility, toilet transfer/ hygiene and UB/LB dressing. Overall, pt requires min guard assist for functional mobility from EOB>BR>recliner with RW needing cues for safety awareness and RW mgmt. Pt completed toileting hygiene with MIN A to manage gown. Pt completed UB/ LB dressing from recliner with set- up assist and standing grooming tasks at sink with min guard. Pt likely to DC home with daughter however feel pt more appropriate for SNF. Will follow acutely per POC.    Follow Up Recommendations  SNF;Supervision/Assistance - 24 hour    Equipment Recommendations  Other (comment)(TBD)    Recommendations for Other Services      Precautions / Restrictions Precautions Precautions: Fall Restrictions Weight Bearing Restrictions: No       Mobility Bed Mobility Overal bed mobility: Needs Assistance Bed Mobility: Rolling;Sidelying to Sit Rolling: Supervision   Supine to sit: Supervision     General bed mobility comments: Pt demo good recall and use of log roll technique for bed mobility.   Transfers Overall transfer level: Needs assistance Equipment used: Rolling walker (2 wheeled) Transfers: Sit to/from Stand Sit to Stand: Min guard;Min assist         General transfer comment: min guard for safety    Balance Overall balance assessment: Needs assistance;History of  Falls Sitting-balance support: Feet supported Sitting balance-Leahy Scale: Good     Standing balance support: No upper extremity supported;During functional activity Standing balance-Leahy Scale: Fair Standing balance comment: close min guard duing dynamic functional tasks                           ADL either performed or assessed with clinical judgement   ADL Overall ADL's : Needs assistance/impaired     Grooming: Wash/dry hands;Standing;Min guard           Upper Body Dressing : Set up;Sitting   Lower Body Dressing: Set up;Sit to/from stand;Min guard   Toilet Transfer: Min guard;Ambulation;Regular Toilet;Grab bars;RW   Toileting- Water quality scientist and Hygiene: Sit to/from stand;Minimal assistance       Functional mobility during ADLs: Min guard;Rolling walker General ADL Comments: pt presents with decreased balance, impaired insight into deficits and decreased ability to care for self impacting ability to engage in ADLs     Vision       Perception     Praxis      Cognition Arousal/Alertness: Awake/alert Behavior During Therapy: [facility] for tasks assessed/performed Overall Cognitive Status: Within Functional Limits for tasks assessed Area of Impairment: Safety/judgement;Awareness;Problem solving                         Safety/Judgement: Decreased awareness of safety;Decreased awareness of deficits Awareness: Intellectual Problem Solving: Requires verbal cues General Comments: Poor insight into extent of deficits. Cues for safety and to slow pace.         Exercises     Shoulder Instructions  General Comments continued education on fall prevention strategies and utilizing RW in home    Pertinent Vitals/ Pain       Pain Assessment: Faces Faces Pain Scale: No hurt  Home Living                                          Prior Functioning/Environment              Frequency  Min 2X/week         Progress Toward Goals  OT Goals(current goals can now be found in the care plan section)  Progress towards OT goals: Progressing toward goals  Acute Rehab OT Goals Patient Stated Goal: to get stronger OT Goal Formulation: With patient Time For Goal Achievement: 12/05/19 Potential to Achieve Goals: Good  Plan Discharge plan remains appropriate    Co-evaluation                 AM-PAC OT "6 Clicks" Daily Activity     Outcome Measure   Help from another person eating meals?: None Help from another person taking care of personal grooming?: A Little Help from another person toileting, which includes using toliet, bedpan, or urinal?: A Little Help from another person bathing (including washing, rinsing, drying)?: A Little Help from another person to put on and taking off regular upper body clothing?: A Little Help from another person to put on and taking off regular lower body clothing?: A Lot 6 Click Score: 18    End of Session Equipment Utilized During Treatment: Rolling walker  OT Visit Diagnosis: Other abnormalities of gait and mobility (R26.89);History of falling (Z91.81);Muscle weakness (generalized) (M62.81)   Activity Tolerance Patient tolerated treatment well   Patient Left in chair;with call bell/phone within reach;with chair alarm set   Nurse Communication Mobility status;Other (comment)(needs IV removed)        Time: 3151-7616 OT Time Calculation (min): 17 min  Charges: OT General Charges $OT Visit: 1 Visit OT Treatments $Self Care/Home Management : 8-22 mins  Lanier Clam., COTA/L Acute Rehabilitation Services [phone] [place] 11/29/2019, 1:01 PM

## 2019-12-15 ENCOUNTER — Emergency Department (HOSPITAL_COMMUNITY): Payer: Medicare Other

## 2019-12-15 ENCOUNTER — Other Ambulatory Visit: Payer: Self-pay

## 2019-12-15 ENCOUNTER — Encounter (HOSPITAL_COMMUNITY): Payer: Self-pay | Admitting: Emergency Medicine

## 2019-12-15 ENCOUNTER — Inpatient Hospital Stay (HOSPITAL_COMMUNITY)
Admission: EM | Admit: 2019-12-15 | Discharge: 2019-12-17 | DRG: 291 | Disposition: A | Discharge status: Home / Self Care | Payer: Medicare Other | Source: Other Acute Inpatient Hospital | Attending: Internal Medicine | Admitting: Internal Medicine

## 2019-12-15 DIAGNOSIS — F1011 Alcohol abuse, in remission: Secondary | ICD-10-CM | POA: Diagnosis present

## 2019-12-15 DIAGNOSIS — I440 Atrioventricular block, first degree: Secondary | ICD-10-CM | POA: Diagnosis present

## 2019-12-15 DIAGNOSIS — E1142 Type 2 diabetes mellitus with diabetic polyneuropathy: Secondary | ICD-10-CM | POA: Diagnosis present

## 2019-12-15 DIAGNOSIS — Z9081 Acquired absence of spleen: Secondary | ICD-10-CM | POA: Diagnosis not present

## 2019-12-15 DIAGNOSIS — I10 Essential (primary) hypertension: Secondary | ICD-10-CM | POA: Diagnosis present

## 2019-12-15 DIAGNOSIS — I132 Hypertensive heart and chronic kidney disease with heart failure and with stage 5 chronic kidney disease, or end stage renal disease: Principal | ICD-10-CM | POA: Diagnosis present

## 2019-12-15 DIAGNOSIS — F17210 Nicotine dependence, cigarettes, uncomplicated: Secondary | ICD-10-CM | POA: Diagnosis present

## 2019-12-15 DIAGNOSIS — E1122 Type 2 diabetes mellitus with diabetic chronic kidney disease: Secondary | ICD-10-CM | POA: Diagnosis present

## 2019-12-15 DIAGNOSIS — E11319 Type 2 diabetes mellitus with unspecified diabetic retinopathy without macular edema: Secondary | ICD-10-CM | POA: Diagnosis present

## 2019-12-15 DIAGNOSIS — Z794 Long term (current) use of insulin: Secondary | ICD-10-CM

## 2019-12-15 DIAGNOSIS — Z20822 Contact with and (suspected) exposure to covid-19: Secondary | ICD-10-CM | POA: Diagnosis present

## 2019-12-15 DIAGNOSIS — E871 Hypo-osmolality and hyponatremia: Secondary | ICD-10-CM | POA: Diagnosis present

## 2019-12-15 DIAGNOSIS — I5042 Chronic combined systolic (congestive) and diastolic (congestive) heart failure: Secondary | ICD-10-CM | POA: Diagnosis present

## 2019-12-15 DIAGNOSIS — Z992 Dependence on renal dialysis: Secondary | ICD-10-CM | POA: Diagnosis not present

## 2019-12-15 DIAGNOSIS — F1411 Cocaine abuse, in remission: Secondary | ICD-10-CM | POA: Diagnosis present

## 2019-12-15 DIAGNOSIS — J9600 Acute respiratory failure, unspecified whether with hypoxia or hypercapnia: Secondary | ICD-10-CM | POA: Diagnosis present

## 2019-12-15 DIAGNOSIS — I509 Heart failure, unspecified: Secondary | ICD-10-CM | POA: Diagnosis not present

## 2019-12-15 DIAGNOSIS — Z79899 Other long term (current) drug therapy: Secondary | ICD-10-CM

## 2019-12-15 DIAGNOSIS — I5043 Acute on chronic combined systolic (congestive) and diastolic (congestive) heart failure: Secondary | ICD-10-CM | POA: Diagnosis present

## 2019-12-15 DIAGNOSIS — Z9111 Patient's noncompliance with dietary regimen: Secondary | ICD-10-CM

## 2019-12-15 DIAGNOSIS — N186 End stage renal disease: Secondary | ICD-10-CM | POA: Diagnosis present

## 2019-12-15 DIAGNOSIS — D649 Anemia, unspecified: Secondary | ICD-10-CM | POA: Diagnosis present

## 2019-12-15 DIAGNOSIS — Z09 Encounter for follow-up examination after completed treatment for conditions other than malignant neoplasm: Secondary | ICD-10-CM

## 2019-12-15 DIAGNOSIS — I252 Old myocardial infarction: Secondary | ICD-10-CM

## 2019-12-15 DIAGNOSIS — J449 Chronic obstructive pulmonary disease, unspecified: Secondary | ICD-10-CM | POA: Diagnosis present

## 2019-12-15 DIAGNOSIS — Z8673 Personal history of transient ischemic attack (TIA), and cerebral infarction without residual deficits: Secondary | ICD-10-CM

## 2019-12-15 DIAGNOSIS — Z7951 Long term (current) use of inhaled steroids: Secondary | ICD-10-CM

## 2019-12-15 DIAGNOSIS — J9601 Acute respiratory failure with hypoxia: Secondary | ICD-10-CM | POA: Diagnosis present

## 2019-12-15 DIAGNOSIS — Z9181 History of falling: Secondary | ICD-10-CM | POA: Diagnosis not present

## 2019-12-15 DIAGNOSIS — Z87441 Personal history of nephrotic syndrome: Secondary | ICD-10-CM | POA: Diagnosis not present

## 2019-12-15 DIAGNOSIS — F250 Schizoaffective disorder, bipolar type: Secondary | ICD-10-CM | POA: Diagnosis present

## 2019-12-15 DIAGNOSIS — J811 Chronic pulmonary edema: Secondary | ICD-10-CM

## 2019-12-15 DIAGNOSIS — R0602 Shortness of breath: Secondary | ICD-10-CM

## 2019-12-15 LAB — BASIC METABOLIC PANEL
Anion gap: 11 (ref 5–15)
BUN: 29 mg/dL — ABNORMAL HIGH (ref 6–20)
CO2: 24 mmol/L (ref 22–32)
Calcium: 8.2 mg/dL — ABNORMAL LOW (ref 8.9–10.3)
Chloride: 91 mmol/L — ABNORMAL LOW (ref 98–111)
Creatinine, Ser: 3.44 mg/dL — ABNORMAL HIGH (ref 0.61–1.24)
GFR calc Af Amer: 22 mL/min — ABNORMAL LOW (ref >60)
GFR calc non Af Amer: 19 mL/min — ABNORMAL LOW (ref >60)
Glucose, Bld: 289 mg/dL — ABNORMAL HIGH (ref 70–99)
Potassium: 4.6 mmol/L (ref 3.5–5.1)
Sodium: 126 mmol/L — ABNORMAL LOW (ref 135–145)

## 2019-12-15 LAB — CBC WITH DIFFERENTIAL/PLATELET
Abs Immature Granulocytes: 0.03 10*3/uL (ref 0.00–0.07)
Basophils Absolute: 0 10*3/uL (ref 0.0–0.1)
Basophils Relative: 0 %
Eosinophils Absolute: 0.1 10*3/uL (ref 0.0–0.5)
Eosinophils Relative: 2 %
HCT: 33.2 % — ABNORMAL LOW (ref 39.0–52.0)
Hemoglobin: 11.2 g/dL — ABNORMAL LOW (ref 13.0–17.0)
Immature Granulocytes: 0 %
Lymphocytes Relative: 9 %
Lymphs Abs: 0.7 10*3/uL (ref 0.7–4.0)
MCH: 33.1 pg (ref 26.0–34.0)
MCHC: 33.7 g/dL (ref 30.0–36.0)
MCV: 98.2 fL (ref 80.0–100.0)
Monocytes Absolute: 0.5 10*3/uL (ref 0.1–1.0)
Monocytes Relative: 6 %
Neutro Abs: 6 10*3/uL (ref 1.7–7.7)
Neutrophils Relative %: 83 %
Platelets: 232 10*3/uL (ref 150–400)
RBC: 3.38 MIL/uL — ABNORMAL LOW (ref 4.22–5.81)
RDW: 14.9 % (ref 11.5–15.5)
WBC: 7.4 10*3/uL (ref 4.0–10.5)
nRBC: 0 % (ref 0.0–0.2)

## 2019-12-15 LAB — MAGNESIUM: Magnesium: 2.1 mg/dL (ref 1.7–2.4)

## 2019-12-15 LAB — RAPID URINE DRUG SCREEN, HOSP PERFORMED
Amphetamines: NOT DETECTED
Barbiturates: NOT DETECTED
Benzodiazepines: NOT DETECTED
Cocaine: NOT DETECTED
Opiates: NOT DETECTED
Tetrahydrocannabinol: NOT DETECTED

## 2019-12-15 LAB — GLUCOSE, CAPILLARY: Glucose-Capillary: 200 mg/dL — ABNORMAL HIGH (ref 70–99)

## 2019-12-15 LAB — TROPONIN I (HIGH SENSITIVITY)
Troponin I (High Sensitivity): 31 ng/L — ABNORMAL HIGH (ref <18)
Troponin I (High Sensitivity): 37 ng/L — ABNORMAL HIGH (ref <18)

## 2019-12-15 LAB — BRAIN NATRIURETIC PEPTIDE: B Natriuretic Peptide: 2145 pg/mL — ABNORMAL HIGH (ref 0.0–100.0)

## 2019-12-15 MED ORDER — FUROSEMIDE 10 MG/ML IJ SOLN
80.0000 mg | Freq: Once | INTRAMUSCULAR | Status: AC
  Administered 2019-12-15: 14:00:00 80 mg via INTRAVENOUS

## 2019-12-15 MED ORDER — FUROSEMIDE 10 MG/ML IJ SOLN
60.0000 mg | Freq: Two times a day (BID) | INTRAMUSCULAR | Status: DC
  Administered 2019-12-16: 60 mg via INTRAVENOUS
  Filled 2019-12-15: qty 6

## 2019-12-15 MED ORDER — HEPARIN SODIUM (PORCINE) 5000 UNIT/ML IJ SOLN
5000.0000 [IU] | Freq: Three times a day (TID) | INTRAMUSCULAR | Status: DC
  Administered 2019-12-15 – 2019-12-17 (×4): 5000 [IU] via SUBCUTANEOUS
  Filled 2019-12-15 (×4): qty 1

## 2019-12-15 MED ORDER — FUROSEMIDE 10 MG/ML IJ SOLN
80.0000 mg | Freq: Once | INTRAMUSCULAR | Status: DC
  Filled 2019-12-15: qty 8

## 2019-12-15 MED ORDER — GABAPENTIN 300 MG PO CAPS: 300.0000 mg | ORAL_CAPSULE | Freq: Every day | ORAL | Status: DC

## 2019-12-15 MED ORDER — GABAPENTIN 300 MG PO CAPS
300.0000 mg | ORAL_CAPSULE | Freq: Every day | ORAL | Status: DC
  Administered 2019-12-16 – 2019-12-17 (×2): 300 mg via ORAL
  Filled 2019-12-15 (×2): qty 1

## 2019-12-15 MED ORDER — ONDANSETRON HCL 4 MG PO TABS: 4.0000 mg | ORAL_TABLET | Freq: Four times a day (QID) | ORAL | Status: DC | PRN

## 2019-12-15 MED ORDER — CARBAMAZEPINE ER 300 MG PO CP12: 600.0000 mg | ORAL_CAPSULE | Freq: Every day | ORAL | Status: DC

## 2019-12-15 MED ORDER — CHLORHEXIDINE GLUCONATE CLOTH 2 % EX PADS: 6.0000 | MEDICATED_PAD | Freq: Every day | CUTANEOUS | Status: DC

## 2019-12-15 MED ORDER — INSULIN GLARGINE 100 UNIT/ML ~~LOC~~ SOLN
18.0000 [IU] | Freq: Every day | SUBCUTANEOUS | Status: DC
  Administered 2019-12-16 – 2019-12-17 (×2): 18 [IU] via SUBCUTANEOUS
  Filled 2019-12-15 (×2): qty 0.18

## 2019-12-15 MED ORDER — ACETAMINOPHEN 650 MG RE SUPP: 650.0000 mg | Freq: Four times a day (QID) | RECTAL | Status: DC | PRN

## 2019-12-15 MED ORDER — ALBUTEROL SULFATE (2.5 MG/3ML) 0.083% IN NEBU: 3.0000 mL | INHALATION_SOLUTION | RESPIRATORY_TRACT | Status: DC | PRN

## 2019-12-15 MED ORDER — ONDANSETRON HCL 4 MG/2ML IJ SOLN: 4.0000 mg | Freq: Four times a day (QID) | INTRAMUSCULAR | Status: DC | PRN

## 2019-12-15 MED ORDER — VENLAFAXINE HCL ER 75 MG PO CP24
225.0000 mg | ORAL_CAPSULE | Freq: Every day | ORAL | Status: DC
  Administered 2019-12-16 – 2019-12-17 (×2): 225 mg via ORAL
  Filled 2019-12-15 (×2): qty 3

## 2019-12-15 MED ORDER — POLYETHYLENE GLYCOL 3350 17 G PO PACK: 17.0000 g | PACK | Freq: Every day | ORAL | Status: DC | PRN

## 2019-12-15 MED ORDER — NITROGLYCERIN 0.4 MG SL SUBL: 0.4000 mg | SUBLINGUAL_TABLET | SUBLINGUAL | Status: DC | PRN

## 2019-12-15 MED ORDER — INSULIN ASPART 100 UNIT/ML ~~LOC~~ SOLN
0.0000 [IU] | Freq: Three times a day (TID) | SUBCUTANEOUS | Status: DC
  Administered 2019-12-16: 18:00:00 3 [IU] via SUBCUTANEOUS
  Administered 2019-12-16: 8 [IU] via SUBCUTANEOUS
  Administered 2019-12-17: 07:00:00 2 [IU] via SUBCUTANEOUS

## 2019-12-15 MED ORDER — ACETAMINOPHEN 325 MG PO TABS: 650.0000 mg | ORAL_TABLET | Freq: Four times a day (QID) | ORAL | Status: DC | PRN

## 2019-12-15 MED ORDER — OLANZAPINE 5 MG PO TABS
5.0000 mg | ORAL_TABLET | Freq: Every day | ORAL | Status: DC
  Administered 2019-12-15 – 2019-12-16 (×2): 5 mg via ORAL
  Filled 2019-12-15 (×2): qty 1

## 2019-12-15 MED ORDER — CARBAMAZEPINE ER 200 MG PO TB12
600.0000 mg | ORAL_TABLET | Freq: Every day | ORAL | Status: DC
  Administered 2019-12-15 – 2019-12-16 (×2): 600 mg via ORAL
  Filled 2019-12-15 (×3): qty 3

## 2019-12-15 MED ORDER — INSULIN GLARGINE 100 UNIT/ML SOLOSTAR PEN: 18.0000 [IU] | PEN_INJECTOR | Freq: Every day | SUBCUTANEOUS | Status: DC

## 2019-12-15 MED ORDER — AMLODIPINE BESYLATE 10 MG PO TABS
10.0000 mg | ORAL_TABLET | Freq: Every day | ORAL | Status: DC
  Administered 2019-12-16 – 2019-12-17 (×2): 10 mg via ORAL
  Filled 2019-12-15 (×2): qty 1

## 2019-12-15 MED ORDER — LOSARTAN POTASSIUM 25 MG PO TABS
25.0000 mg | ORAL_TABLET | Freq: Every evening | ORAL | Status: DC
  Administered 2019-12-15 – 2019-12-16 (×2): 25 mg via ORAL
  Filled 2019-12-15 (×2): qty 1

## 2019-12-15 MED ORDER — ISOSORB DINITRATE-HYDRALAZINE 20-37.5 MG PO TABS
2.0000 | ORAL_TABLET | Freq: Three times a day (TID) | ORAL | Status: DC
  Administered 2019-12-15 – 2019-12-17 (×5): 2 via ORAL
  Filled 2019-12-15 (×5): qty 2

## 2019-12-15 MED ORDER — FERROUS SULFATE 325 (65 FE) MG PO TABS
325.0000 mg | ORAL_TABLET | Freq: Every day | ORAL | Status: DC
  Administered 2019-12-16 – 2019-12-17 (×2): 325 mg via ORAL
  Filled 2019-12-15 (×2): qty 1

## 2019-12-15 MED ORDER — SODIUM BICARBONATE 650 MG PO TABS
1300.0000 mg | ORAL_TABLET | Freq: Two times a day (BID) | ORAL | Status: DC
  Administered 2019-12-15 – 2019-12-17 (×4): 1300 mg via ORAL
  Filled 2019-12-15 (×4): qty 2

## 2019-12-15 MED ORDER — INSULIN ASPART 100 UNIT/ML ~~LOC~~ SOLN
0.0000 [IU] | Freq: Every day | SUBCUTANEOUS | Status: DC
  Administered 2019-12-16: 2 [IU] via SUBCUTANEOUS

## 2019-12-15 MED ORDER — TRAZODONE HCL 100 MG PO TABS
200.0000 mg | ORAL_TABLET | Freq: Every day | ORAL | Status: DC
  Administered 2019-12-15 – 2019-12-16 (×2): 200 mg via ORAL
  Filled 2019-12-15 (×2): qty 2

## 2019-12-15 MED ORDER — FOLIC ACID 1 MG PO TABS
1.0000 mg | ORAL_TABLET | Freq: Every day | ORAL | Status: DC
  Administered 2019-12-15 – 2019-12-17 (×3): 1 mg via ORAL
  Filled 2019-12-15 (×3): qty 1

## 2019-12-15 MED ORDER — NITROGLYCERIN 2 % TD OINT
1.0000 [in_us] | TOPICAL_OINTMENT | Freq: Once | TRANSDERMAL | Status: AC
  Administered 2019-12-15: 14:00:00 1 [in_us] via TOPICAL
  Filled 2019-12-15: qty 1

## 2019-12-15 MED ORDER — CALCIUM ACETATE (PHOS BINDER) 667 MG PO CAPS
1334.0000 mg | ORAL_CAPSULE | Freq: Three times a day (TID) | ORAL | Status: DC
  Administered 2019-12-16 – 2019-12-17 (×4): 1334 mg via ORAL
  Filled 2019-12-15 (×4): qty 2

## 2019-12-15 NOTE — ED Notes (Signed)
Report given to [facility] staff

## 2019-12-15 NOTE — ED Notes (Signed)
Report given to [facility]

## 2019-12-15 NOTE — Progress Notes (Signed)
Nephro paged. Per MD, pt will do HD today
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
## 2019-12-15 NOTE — ED Notes (Signed)
Pt given urinal.
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
## 2019-12-15 NOTE — ED Triage Notes (Addendum)
Pt was having dialysis today, started having central chest pain. o2 sat 86%n on room air with EMS. EMS gave 324 of ASA. One nitro given by dialysis staff.

## 2019-12-16 ENCOUNTER — Inpatient Hospital Stay (HOSPITAL_COMMUNITY): Payer: Medicare Other

## 2019-12-16 ENCOUNTER — Encounter (HOSPITAL_COMMUNITY): Payer: Self-pay | Admitting: Internal Medicine

## 2019-12-16 DIAGNOSIS — N186 End stage renal disease: Secondary | ICD-10-CM

## 2019-12-16 DIAGNOSIS — J9601 Acute respiratory failure with hypoxia: Secondary | ICD-10-CM

## 2019-12-16 DIAGNOSIS — J449 Chronic obstructive pulmonary disease, unspecified: Secondary | ICD-10-CM

## 2019-12-16 DIAGNOSIS — I5043 Acute on chronic combined systolic (congestive) and diastolic (congestive) heart failure: Secondary | ICD-10-CM

## 2019-12-16 LAB — BASIC METABOLIC PANEL
Anion gap: 10 (ref 5–15)
BUN: 12 mg/dL (ref 6–20)
CO2: 26 mmol/L (ref 22–32)
Calcium: 8.2 mg/dL — ABNORMAL LOW (ref 8.9–10.3)
Chloride: 98 mmol/L (ref 98–111)
Creatinine, Ser: 2.06 mg/dL — ABNORMAL HIGH (ref 0.61–1.24)
GFR calc Af Amer: 41 mL/min — ABNORMAL LOW (ref >60)
GFR calc non Af Amer: 35 mL/min — ABNORMAL LOW (ref >60)
Glucose, Bld: 129 mg/dL — ABNORMAL HIGH (ref 70–99)
Potassium: 3.6 mmol/L (ref 3.5–5.1)
Sodium: 134 mmol/L — ABNORMAL LOW (ref 135–145)

## 2019-12-16 LAB — GLUCOSE, CAPILLARY
Glucose-Capillary: 262 mg/dL — ABNORMAL HIGH (ref 70–99)
Glucose-Capillary: 188 mg/dL — ABNORMAL HIGH (ref 70–99)
Glucose-Capillary: 207 mg/dL — ABNORMAL HIGH (ref 70–99)

## 2019-12-16 LAB — SARS CORONAVIRUS 2 (TAT 6-24 HRS): SARS Coronavirus 2: NEGATIVE

## 2019-12-16 MED ORDER — HEPARIN SODIUM (PORCINE) 1000 UNIT/ML DIALYSIS
1000.0000 [IU] | INTRAMUSCULAR | Status: DC | PRN
  Administered 2019-12-16: 08:00:00 1000 [IU] via INTRAVENOUS_CENTRAL
  Filled 2019-12-16 (×2): qty 1

## 2019-12-16 MED ORDER — LIDOCAINE-PRILOCAINE 2.5-2.5 % EX CREA
1.0000 "application " | TOPICAL_CREAM | CUTANEOUS | Status: DC | PRN
  Filled 2019-12-16: qty 5

## 2019-12-16 MED ORDER — PENTAFLUOROPROP-TETRAFLUOROETH EX AERO: 1.0000 "application " | INHALATION_SPRAY | CUTANEOUS | Status: DC | PRN

## 2019-12-16 MED ORDER — HEPARIN SODIUM (PORCINE) 1000 UNIT/ML IJ SOLN
INTRAMUSCULAR | Status: AC
  Filled 2019-12-16: qty 4

## 2019-12-16 MED ORDER — LIDOCAINE HCL (PF) 1 % IJ SOLN: 5.0000 mL | INTRAMUSCULAR | Status: DC | PRN

## 2019-12-16 MED ORDER — SODIUM CHLORIDE 0.9 % IV SOLN: 100.0000 mL | INTRAVENOUS | Status: DC | PRN

## 2019-12-16 MED ORDER — ALTEPLASE 2 MG IJ SOLR: 2.0000 mg | Freq: Once | INTRAMUSCULAR | Status: DC | PRN

## 2019-12-16 NOTE — Progress Notes (Signed)
PROGRESS NOTE        PATIENT DETAILS Name: Barry Lewis Age: 59 y.o. Sex: male Date of Birth: [birth year] Admit Date: 12/15/2019 Admitting Physician Ejiroghene Arlyce Dice, MD PPJ:[id number], Laney Potash IV, FNP  Brief Narrative: Patient is a 59 y.o. male with history of ESRD on HD MWF-who was referred to the emergency room on 4/2 from his dialysis center for worsening shortness of breath.  Found to have acute hypoxic respiratory failure secondary to pulmonary edema.  Transferred to [facility] from EP for nephrology evaluation.  Significant events: 4/2>> admit for hypoxemia due to pulmonary edema  Antimicrobial therapy: None  Microbiology data: None  Procedures : None  Consults: Nephrology  DVT Prophylaxis : Prophylactic Heparin   Subjective: Seen earlier this morning at hemodialysis-feels better.  Assessment/Plan: Acute hypoxemic respiratory failure secondary to decompensated systolic and diastolic heart failure (EF 25-30% by TTE on 11/20/2019) in the setting of dietary noncompliance and ESRD: On 2 L of oxygen this morning-appears comfortable-plan is to reassess after HD to see if oxygenation/hypoxemia improves.  Have counseled patient regarding importance of dietary compliance and fluid restriction.  Chest pain: No further chest pain this morning.  Suspect atypical-troponins negative.  Chest pain likely related to fluid overload/hypoxemia.  EKG without acute findings.  COPD: No wheezing-continue as needed bronchodilators  ESRD on HD MWF: Nephrology following and directing care.  Prolonged QTC: Potassium/magnesium level stable-watch closely-repeat EKG tomorrow morning.  Monitor in telemetry.  DM-2: Continue with Lantus 18 units daily and SSI for now-follow.  Recent Labs    12/15/19 2102  GLUCAP 200*   HTN: BP stable-continue Imdur, losartan, pain.  Follow and optimize.  Bipolar disorder: Stable-continue olanzapine, Tegretol, trazodone and  Effexor.  Cocaine use: Claims no use in the past 1 month-UDS on admission negative.   Diet: Diet Order            Diet renal/carb modified with fluid restriction Diet-HS Snack? Nothing; Fluid restriction: 1200 mL Fluid; Room service appropriate? Yes; Fluid consistency: Thin  Diet effective now               Code Status: Full code   Family Communication: Left a voicemail for daughter  Disposition Plan: Home  when ready for discharge  Barriers to Discharge: Hypoxemia due to decompensated heart failure requiring urgent hemodialysis  Antimicrobial agents: Anti-infectives (From admission, onward)   None       Time spent: 25 minutes-Greater than 50% of this time was spent in counseling, explanation of diagnosis, planning of further management, and coordination of care.  MEDICATIONS: Scheduled Meds: . amLODipine  10 mg Oral Daily  . calcium acetate  1,334 mg Oral TID WC  . carbamazepine  600 mg Oral QHS  . Chlorhexidine Gluconate Cloth  6 each Topical Q0600  . ferrous sulfate  325 mg Oral Q breakfast  . folic acid  1 mg Oral Daily  . furosemide  60 mg Intravenous Q12H  . gabapentin  300 mg Oral Daily  . heparin      . heparin  5,000 Units Subcutaneous Q8H  . insulin aspart  0-15 Units Subcutaneous TID WC  . insulin aspart  0-5 Units Subcutaneous QHS  . insulin glargine  18 Units Subcutaneous Daily  . isosorbide-hydrALAZINE  2 tablet Oral TID  . losartan  25 mg Oral QPM  . OLANZapine  5 mg Oral  QHS  . sodium bicarbonate  1,300 mg Oral BID  . traZODone  200 mg Oral QHS  . venlafaxine XR  225 mg Oral Q breakfast   Continuous Infusions: . sodium chloride    . sodium chloride     PRN Meds:.sodium chloride, sodium chloride, acetaminophen **OR** acetaminophen, albuterol, alteplase, heparin, lidocaine (PF), lidocaine-prilocaine, nitroGLYCERIN, pentafluoroprop-tetrafluoroeth, polyethylene glycol   PHYSICAL EXAM: Vital signs: Vitals:   12/16/19 0700 12/16/19 0730  12/16/19 0740 12/16/19 0909  BP: 128/69 (!) 147/81 (!) 144/80 (!) 158/82  Pulse: 76 72 (!) 103 85  Resp:   16 17  Temp:   98.2 F (36.8 C)   TempSrc:   Oral   SpO2: 96%  98% 96%  Weight:   79.5 kg   Height:       Filed Weights   12/15/19 1826 12/16/19 0327 12/16/19 0740  Weight: 82.6 kg 82.2 kg 79.5 kg   Body mass index is 28.29 kg/m.   Gen Exam:Alert awake-not in any distress HEENT:atraumatic, normocephalic Chest: Bibasilar rales CVS:S1S2 regular Abdomen:soft non tender, non distended Extremities:no edema Neurology: Non focal Skin: no rash  I have personally reviewed following labs and imaging studies  LABORATORY DATA: CBC: Recent Labs  Lab 12/15/19 1347  WBC 7.4  NEUTROABS 6.0  HGB 11.2*  HCT 33.2*  MCV 98.2  PLT 001    Basic Metabolic Panel: Recent Labs  Lab 12/15/19 1347 12/16/19 0923  NA 126* 134*  K 4.6 3.6  CL 91* 98  CO2 24 26  GLUCOSE 289* 129*  BUN 29* 12  CREATININE 3.44* 2.06*  CALCIUM 8.2* 8.2*  MG 2.1  --     GFR: Estimated Creatinine Clearance: 39.7 mL/min (A) (by C-G formula based on SCr of 2.06 mg/dL (H)).  Liver Function Tests: No results for input(s): AST, ALT, ALKPHOS, BILITOT, PROT, ALBUMIN in the last 168 hours. No results for input(s): LIPASE, AMYLASE in the last 168 hours. No results for input(s): AMMONIA in the last 168 hours.  Coagulation Profile: No results for input(s): INR, PROTIME in the last 168 hours.  Cardiac Enzymes: No results for input(s): CKTOTAL, CKMB, CKMBINDEX, TROPONINI in the last 168 hours.  BNP (last 3 results) No results for input(s): PROBNP in the last 8760 hours.  Lipid Profile: No results for input(s): CHOL, HDL, LDLCALC, TRIG, CHOLHDL, LDLDIRECT in the last 72 hours.  Thyroid Function Tests: No results for input(s): TSH, T4TOTAL, FREET4, T3FREE, THYROIDAB in the last 72 hours.  Anemia Panel: No results for input(s): VITAMINB12, FOLATE, FERRITIN, TIBC, IRON, RETICCTPCT in the last 72  hours.  Urine analysis:    Component Value Date/Time   COLORURINE STRAW (A) 11/28/2019 1244   APPEARANCEUR CLEAR 11/28/2019 1244   LABSPEC 1.008 11/28/2019 1244   PHURINE 7.0 11/28/2019 1244   GLUCOSEU 150 (A) 11/28/2019 1244   HGBUR NEGATIVE 11/28/2019 [place] 11/28/2019 [place] 11/28/2019 1244   PROTEINUR >=300 (A) 11/28/2019 1244   NITRITE NEGATIVE 11/28/2019 1244   LEUKOCYTESUR NEGATIVE 11/28/2019 1244    Sepsis Labs: Lactic Acid, Venous    Component Value Date/Time   LATICACIDVEN 1.1 10/13/2019 0238    MICROBIOLOGY: Recent Results (from the past 240 hour(s))  SARS CORONAVIRUS 2 (TAT 6-24 HRS) Nasopharyngeal Nasopharyngeal Swab     Status: None   Collection Time: 12/15/19  4:10 PM   Specimen: Nasopharyngeal Swab  Result Value Ref Range Status   SARS Coronavirus 2 NEGATIVE NEGATIVE Final    Comment: (NOTE)  SARS-CoV-2 target nucleic acids are NOT DETECTED. The SARS-CoV-2 RNA is generally detectable in upper and lower respiratory specimens during the acute phase of infection. Negative results do not preclude SARS-CoV-2 infection, do not rule out co-infections with other pathogens, and should not be used as the sole basis for treatment or other patient management decisions. Negative results must be combined with clinical observations, patient history, and epidemiological information. The expected result is Negative. Fact Sheet for Patients: SugarRoll.be Fact Sheet for Healthcare Providers: https://www.woods-mathews.com/ This test is not yet approved or cleared by the [place] FDA and  has been authorized for detection and/or diagnosis of SARS-CoV-2 by FDA under an Emergency Use Authorization (EUA). This EUA will remain  in effect (meaning this test can be used) for the duration of the COVID-19 declaration under Section 56 4(b)(1) of the Act, 21 U.S.C. section 360bbb-3(b)(1), unless  the authorization is terminated or revoked sooner. Performed at [facility], [place] [address] [place], [place] [address]     RADIOLOGY STUDIES/RESULTS: DG Chest Portable 1 View  Result Date: 12/15/2019 CLINICAL DATA:  Chest pain and shortness of breath EXAM: PORTABLE CHEST 1 VIEW COMPARISON:  Portable exam 1334 hours compared to 11/20/2019 FINDINGS: RIGHT jugular line with tip projecting over SVC. Enlargement of cardiac silhouette with pulmonary vascular congestion. Mediastinal contour stable. BILATERAL pulmonary infiltrates favoring pulmonary edema though multifocal infection is not excluded. Subsegmental atelectasis at the minor fissure. No definite pleural effusion or pneumothorax. IMPRESSION: Enlargement of cardiac silhouette with vascular congestion and probable pulmonary edema/CHF. Linear subsegmental atelectasis at minor fissure. Electronically Signed   By: Lavonia Dana M.D.   On: 12/15/2019 13:46     LOS: 1 day   Oren Binet, MD  [facility]    To contact the attending provider between 7A-7P or the covering provider during after hours 7P-7A, please log into the web site [email or web address] and access using universal [place] password for that web site. If you do not have the password, please call the hospital operator.  12/16/2019, 10:15 AM

## 2019-12-16 NOTE — Plan of Care (Signed)

## 2019-12-16 NOTE — Progress Notes (Signed)
Patient has arrived back on to the unit from HD. Patient is alert and oriented x 4 and has no complaints of chest pain. Patient's ECG shows ST elevation abnormalities in V1, V2, and V3. Ghimire, MD paged. Will continue to monitor.

## 2019-12-16 NOTE — Procedures (Signed)
   I was present at this dialysis session, have reviewed the session itself and made  appropriate changes Kelly Splinter MD [place] pager [phone]   12/16/2019, 9:47 AM
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
## 2019-12-17 ENCOUNTER — Encounter (HOSPITAL_COMMUNITY): Payer: Self-pay | Admitting: Pharmacist

## 2019-12-17 ENCOUNTER — Inpatient Hospital Stay (HOSPITAL_COMMUNITY): Payer: Medicare Other

## 2019-12-17 DIAGNOSIS — I509 Heart failure, unspecified: Secondary | ICD-10-CM

## 2019-12-17 LAB — MAGNESIUM: Magnesium: 2.2 mg/dL (ref 1.7–2.4)

## 2019-12-17 LAB — BASIC METABOLIC PANEL
Anion gap: 10 (ref 5–15)
BUN: 30 mg/dL — ABNORMAL HIGH (ref 6–20)
CO2: 27 mmol/L (ref 22–32)
Calcium: 8.2 mg/dL — ABNORMAL LOW (ref 8.9–10.3)
Chloride: 98 mmol/L (ref 98–111)
Creatinine, Ser: 3.6 mg/dL — ABNORMAL HIGH (ref 0.61–1.24)
GFR calc Af Amer: 21 mL/min — ABNORMAL LOW (ref >60)
GFR calc non Af Amer: 18 mL/min — ABNORMAL LOW (ref >60)
Glucose, Bld: 135 mg/dL — ABNORMAL HIGH (ref 70–99)
Potassium: 3.6 mmol/L (ref 3.5–5.1)
Sodium: 135 mmol/L (ref 135–145)

## 2019-12-17 LAB — GLUCOSE, CAPILLARY: Glucose-Capillary: 126 mg/dL — ABNORMAL HIGH (ref 70–99)

## 2019-12-17 NOTE — Discharge Summary (Signed)
PATIENT DETAILS Name: Barry Lewis Age: 59 y.o. Sex: male Date of Birth: 1960-12-05 MRN: [id number]. Admitting Physician: Bethena Roys, MD QMG:[id number], Anabel Bene, FNP  Admit Date: 12/15/2019 Discharge date: 12/17/2019  Recommendations for Outpatient Follow-up:  1. Follow up with PCP in 1-2 weeks 2. Please obtain CMP/CBC in one week 3. Continue counseling regarding importance of dietary compliance  Admitted From:  Home  Disposition: [place]: No  Equipment/Devices: None  Discharge Condition: Stable  CODE STATUS: FULL CODE  Diet recommendation:  Diet Order            Diet - low sodium heart healthy        Diet Carb Modified        Diet renal/carb modified with fluid restriction Diet-HS Snack? Nothing; Fluid restriction: 1200 mL Fluid; Room service appropriate? Yes; Fluid consistency: Thin  Diet effective now               Brief Narrative: Patient is a 59 y.o. male with history of ESRD on HD MWF-who was referred to the emergency room on 4/2 from his dialysis center for worsening shortness of breath.  Found to have acute hypoxic respiratory failure secondary to pulmonary edema.  Transferred to [facility] from EP for nephrology evaluation.  Significant events: 4/2>> admit for hypoxemia due to pulmonary edema  Antimicrobial therapy: None  Microbiology data: None  Procedures : None  Consults: Nephrology  Brief Hospital Course: Acute hypoxemic respiratory failure secondary to decompensated systolic and diastolic heart failure (EF 25-30% by TTE on 11/20/2019) in the setting of dietary noncompliance and ESRD: Much improved after hemodialysis-on room air-he feels better-has ambulated around the room without any difficulty.  Chest x-ray done this morning shows improvement compared to that done on admission.  Spoke with nephrology-Dr. Janeece Fitting to discharge.  Patient will follow with his outpatient hemodialysis center tomorrow.  Long  counseling today regarding dietary compliance by this MD at bedside.   Chest pain: No further chest pain this morning.  Suspect atypical-troponins negative.  Chest pain likely related to fluid overload/hypoxemia.  EKG without acute findings.  COPD: No wheezing-continue as needed bronchodilators  ESRD on HD MWF: Nephrology followed closely.  Prolonged QTC: Resolved.  DM-2: Continue with Lantus 18 units daily-follow with PCP and optimize.  HTN: BP stable-continue Imdur, losartan, pain.  Follow and optimize.  Bipolar disorder: Stable-continue olanzapine, Tegretol, trazodone and Effexor.  Cocaine use: Claims no use in the past 1 month-UDS on admission negative.  Counseled regarding the importance of complete abstinence from cocaine.  Discharge Diagnoses:  Principal Problem:   Decompensated heart failure (HCC) Active Problems:   Hypertension   COPD (chronic obstructive pulmonary disease) (HCC)   Type 2 diabetes mellitus with polyneuropathy (HCC)   Chronic combined systolic and diastolic CHF (congestive heart failure) (HCC)   ESRD (end stage renal disease) ([place])   Acute respiratory failure ([place])   Discharge Instructions:  Activity:  As tolerated   Discharge Instructions    (HEART FAILURE PATIENTS) Call MD:  Anytime you have any of the following symptoms: 1) 3 pound weight gain in 24 hours or 5 pounds in 1 week 2) shortness of breath, with or without a dry hacking cough 3) swelling in the hands, feet or stomach 4) if you have to sleep on extra pillows at night in order to breathe.   Complete by: As directed    Call MD for:  difficulty breathing, headache or visual disturbances   Complete by: As directed  Call MD for:  extreme fatigue   Complete by: As directed    Diet - low sodium heart healthy   Complete by: As directed    Diet Carb Modified   Complete by: As directed    Increase activity slowly   Complete by: As directed      Allergies as of 12/17/2019       Reactions   Lithium Anaphylaxis   Ace Inhibitors Other (See Comments)   Persistent hyperkalemia; per nephrology patient should not be prescribed.   Angiotensin Receptor Blockers Other (See Comments)   Persistent hyperkalemia; per nephrology patient should not be prescribed.   Ibuprofen Other (See Comments)   Due to kidney issues   Invega [paliperidone] Other (See Comments)   Dysarthria and drooling   Prozac [fluoxetine Hcl]    "makes me want to kill people" per ER report      Medication List    TAKE these medications   acetaminophen 325 MG tablet Commonly known as: TYLENOL Take 2 tablets (650 mg total) by mouth every 6 (six) hours as needed for mild pain.   amLODipine 10 MG tablet Commonly known as: NORVASC Take 1 tablet (10 mg total) by mouth daily.   blood glucose meter kit and supplies Kit Dispense based on patient and insurance preference. Use up to four times daily as directed. (FOR ICD-9 250.00, 250.01).   calcium acetate 667 MG capsule Commonly known as: PHOSLO Take 2 capsules (1,334 mg total) by mouth 3 (three) times daily with meals.   carbamazepine 300 MG 12 hr capsule Commonly known as: CARBATROL Take 2 capsules (600 mg total) by mouth at bedtime.   Darbepoetin Alfa 150 MCG/0.3ML Sosy injection Commonly known as: ARANESP Inject 0.3 mLs (150 mcg total) into the skin every Wednesday at 6 PM.   docusate sodium 100 MG capsule Commonly known as: COLACE Take 1 capsule (100 mg total) by mouth 2 (two) times daily.   ferrous sulfate 325 (65 FE) MG tablet Take 1 tablet (325 mg total) by mouth daily with breakfast.   fluticasone 50 MCG/ACT nasal spray Commonly known as: FLONASE Place 2 sprays into both nostrils daily as needed for allergies or rhinitis.   folic acid 1 MG tablet Commonly known as: FOLVITE Take 1 tablet (1 mg total) by mouth daily.   furosemide 40 MG tablet Commonly known as: LASIX Take 40 mg by mouth 2 (two) times daily.   gabapentin 300 MG  capsule Commonly known as: NEURONTIN Take 300 mg by mouth daily.   insulin glargine 100 UNIT/ML Solostar Pen Commonly known as: LANTUS Inject 18 Units into the skin daily.   isosorbide-hydrALAZINE 20-37.5 MG tablet Commonly known as: BIDIL Take 2 tablets by mouth 3 (three) times daily.   losartan 25 MG tablet Commonly known as: COZAAR Take 1 tablet (25 mg total) by mouth every evening.   nitroGLYCERIN 0.4 MG SL tablet Commonly known as: NITROSTAT Place 1 tablet (0.4 mg total) under the tongue every 5 (five) minutes as needed for chest pain.   OLANZapine 5 MG tablet Commonly known as: ZYPREXA Take 1 tablet (5 mg total) by mouth at bedtime.   polyethylene glycol 17 g packet Commonly known as: MIRALAX / GLYCOLAX Take 17 g by mouth daily.   Santyl ointment Generic drug: collagenase Apply 1 application topically daily.   sodium bicarbonate 650 MG tablet Take 2 tablets (1,300 mg total) by mouth 2 (two) times daily.   traZODone 100 MG tablet Commonly known as: DESYREL Take  2 tablets (200 mg total) by mouth at bedtime.   venlafaxine XR 75 MG 24 hr capsule Commonly known as: EFFEXOR-XR Take 3 capsules (225 mg total) by mouth daily with breakfast.      Follow-up Information    Wannetta Sender, FNP. Schedule an appointment as soon as possible for a visit in 1 week(s).   Specialty: Family Medicine Contact information: [place] [facility] [address] [place] [address] [phone]        Dialysis center Follow up.   Why: Go to your usual dialysis center per routine schedule.         Allergies  Allergen Reactions  . Lithium Anaphylaxis  . Ace Inhibitors Other (See Comments)    Persistent hyperkalemia; per nephrology patient should not be prescribed.  . Angiotensin Receptor Blockers Other (See Comments)    Persistent hyperkalemia; per nephrology patient should not be prescribed.  . Ibuprofen Other (See Comments)    Due  to kidney issues   . Invega [Paliperidone] Other (See Comments)    Dysarthria and drooling  . Prozac [Fluoxetine Hcl]     "makes me want to kill people" per ER report     Other Procedures/Studies: DG Chest 2 View  Result Date: 12/16/2019 CLINICAL DATA:  Chest pain EXAM: CHEST - 2 VIEW COMPARISON:  December 15, 2019 FINDINGS: Central catheter tip is at the cavoatrial junction. No pneumothorax. There is persistent consolidation in the lateral left base with small left pleural effusion. There is ill-defined opacity in the right base, similar to prior study. No new areas of opacity evident. There is cardiomegaly with pulmonary venous hypertension. No adenopathy. Multiple healed rib fractures noted on the left. IMPRESSION: Persistent infiltrate with effusion lateral left base. Ill-defined opacity right base is not felt to be appreciably changed. No new opacity evident. Stable cardiomegaly with a degree of pulmonary vascular congestion. Note that the findings present may be indicative of congestive heart failure, although pneumonia in the bases cannot be excluded. Electronically Signed   By: Lowella Grip III M.D.   On: 12/16/2019 17:08   CT Head Wo Contrast  Result Date: 11/20/2019 CLINICAL DATA:  Head trauma EXAM: CT HEAD WITHOUT CONTRAST TECHNIQUE: Contiguous axial images were obtained from the base of the skull through the vertex without intravenous contrast. COMPARISON:  CT brain 10/13/2019 FINDINGS: Brain: No acute territorial infarction, hemorrhage, or intracranial mass. Mild atrophy and chronic small vessel ischemic change of the white matter. Stable ventricle size. Vascular: No hyperdense vessels.  Carotid vascular calcification Skull: Normal. Negative for fracture or focal lesion. Sinuses/Orbits: No acute finding. Other: None IMPRESSION: 1. No CT evidence for acute intracranial abnormality. 2. Atrophy and chronic small vessel ischemic change of the white matter Electronically Signed   By: Donavan Foil M.D.   On: 11/20/2019 18:26   IR Fluoro Guide CV Line Right  Result Date: 11/22/2019 INDICATION: End-stage renal disease and needs hemodialysis. EXAM: FLUOROSCOPIC AND ULTRASOUND GUIDED PLACEMENT OF A TUNNELED DIALYSIS CATHETER Physician: Stephan Minister. Anselm Pancoast, MD MEDICATIONS: Ancef 2 g; The antibiotic was administered within an appropriate time interval prior to skin puncture. ANESTHESIA/SEDATION: Versed 0.5 mg IV; Fentanyl 25 mcg IV; Moderate Sedation Time:  22 minutes The patient was continuously monitored during the procedure by the interventional radiology nurse under my direct supervision. FLUOROSCOPY TIME:  Fluoroscopy Time: 2 minutes and 12 seconds, 15 mGy COMPLICATIONS: None immediate. PROCEDURE: The procedure was explained to the patient. The risks and  benefits of the procedure were discussed and the patient's questions were addressed. Informed consent was obtained from the patient. The patient was placed supine on the interventional table. Ultrasound confirmed a patent right internal jugular vein. Ultrasound image was saved for documentation. The right neck and chest was prepped and draped in a sterile fashion. The right neck was anesthetized with 1% lidocaine. Maximal barrier sterile technique was utilized including caps, mask, sterile gowns, sterile gloves, sterile drape, hand hygiene and skin antiseptic. A small incision was made with #11 blade scalpel. A 21 gauge needle directed into the right internal jugular vein with ultrasound guidance. A micropuncture dilator set was placed. A 23 cm tip to cuff Palindrome catheter was selected. The skin below the right clavicle was anesthetized and a small incision was made with an #11 blade scalpel. A subcutaneous tunnel was formed to the vein dermatotomy site. The catheter was brought through the tunnel. The vein dermatotomy site was dilated to accommodate a peel-away sheath. The catheter was placed through the peel-away sheath and directed into the central  venous structures. The tip of the catheter was placed at the SVC and right atrium junction with fluoroscopy. Fluoroscopic images were obtained for documentation. Both lumens were found to aspirate and flush well. The proper amount of heparin was flushed in both lumens. The vein dermatotomy site was closed using a single layer of absorbable suture and Dermabond. Gel-Foam placed in subcutaneous tract. The catheter was secured to the skin using Prolene suture. IMPRESSION: Successful placement of a right jugular tunneled dialysis catheter using ultrasound and fluoroscopic guidance. Electronically Signed   By: Markus Daft M.D.   On: 11/22/2019 18:33   IR US Guide Vasc Access Right  Result Date: 11/22/2019 INDICATION: End-stage renal disease and needs hemodialysis. EXAM: FLUOROSCOPIC AND ULTRASOUND GUIDED PLACEMENT OF A TUNNELED DIALYSIS CATHETER Physician: Stephan Minister. Anselm Pancoast, MD MEDICATIONS: Ancef 2 g; The antibiotic was administered within an appropriate time interval prior to skin puncture. ANESTHESIA/SEDATION: Versed 0.5 mg IV; Fentanyl 25 mcg IV; Moderate Sedation Time:  22 minutes The patient was continuously monitored during the procedure by the interventional radiology nurse under my direct supervision. FLUOROSCOPY TIME:  Fluoroscopy Time: 2 minutes and 12 seconds, 15 mGy COMPLICATIONS: None immediate. PROCEDURE: The procedure was explained to the patient. The risks and benefits of the procedure were discussed and the patient's questions were addressed. Informed consent was obtained from the patient. The patient was placed supine on the interventional table. Ultrasound confirmed a patent right internal jugular vein. Ultrasound image was saved for documentation. The right neck and chest was prepped and draped in a sterile fashion. The right neck was anesthetized with 1% lidocaine. Maximal barrier sterile technique was utilized including caps, mask, sterile gowns, sterile gloves, sterile drape, hand hygiene and skin  antiseptic. A small incision was made with #11 blade scalpel. A 21 gauge needle directed into the right internal jugular vein with ultrasound guidance. A micropuncture dilator set was placed. A 23 cm tip to cuff Palindrome catheter was selected. The skin below the right clavicle was anesthetized and a small incision was made with an #11 blade scalpel. A subcutaneous tunnel was formed to the vein dermatotomy site. The catheter was brought through the tunnel. The vein dermatotomy site was dilated to accommodate a peel-away sheath. The catheter was placed through the peel-away sheath and directed into the central venous structures. The tip of the catheter was placed at the SVC and right atrium junction with fluoroscopy. Fluoroscopic images were obtained  for documentation. Both lumens were found to aspirate and flush well. The proper amount of heparin was flushed in both lumens. The vein dermatotomy site was closed using a single layer of absorbable suture and Dermabond. Gel-Foam placed in subcutaneous tract. The catheter was secured to the skin using Prolene suture. IMPRESSION: Successful placement of a right jugular tunneled dialysis catheter using ultrasound and fluoroscopic guidance. Electronically Signed   By: Markus Daft M.D.   On: 11/22/2019 18:33   DG Chest Portable 1 View  Result Date: 12/15/2019 CLINICAL DATA:  Chest pain and shortness of breath EXAM: PORTABLE CHEST 1 VIEW COMPARISON:  Portable exam 1334 hours compared to 11/20/2019 FINDINGS: RIGHT jugular line with tip projecting over SVC. Enlargement of cardiac silhouette with pulmonary vascular congestion. Mediastinal contour stable. BILATERAL pulmonary infiltrates favoring pulmonary edema though multifocal infection is not excluded. Subsegmental atelectasis at the minor fissure. No definite pleural effusion or pneumothorax. IMPRESSION: Enlargement of cardiac silhouette with vascular congestion and probable pulmonary edema/CHF. Linear subsegmental  atelectasis at minor fissure. Electronically Signed   By: Lavonia Dana M.D.   On: 12/15/2019 13:46   DG Chest Port 1 View  Result Date: 11/20/2019 CLINICAL DATA:  59 year old male with history of dyspnea. Shortness of breath. EXAM: PORTABLE CHEST 1 VIEW COMPARISON:  Chest x-ray 10/22/2019. FINDINGS: There is cephalization of the pulmonary vasculature, indistinctness of the interstitial markings, and patchy airspace disease throughout the lungs bilaterally suggestive of moderate pulmonary edema. Small left pleural effusion. Heart size is mildly enlarged. Pulmonary vasculatures largely obscured. Upper mediastinal contours are within normal limits. IMPRESSION: 1. The appearance the chest is favored to reflect evolving congestive heart failure, as above. The possibility of multilobar pneumonia from atypical infection such as viral etiology is not excluded. Electronically Signed   By: Vinnie Langton M.D.   On: 11/20/2019 09:25   DG Chest Port 1V same Day  Result Date: 12/17/2019 CLINICAL DATA:  Short of breath. EXAM: PORTABLE CHEST 1 VIEW COMPARISON:  12/16/2019 and older exams. FINDINGS: Previously described lung opacities have improved. Mild residual opacity at the left lateral lung base consistent with chronic scarring. Lungs are hyperexpanded with prominent interstitial markings similar to older exams. No convincing pneumonia or edema. Cardiac silhouette normal in size. No visualized pleural effusion. No pneumothorax. Right internal jugular dual-lumen central venous catheter is stable, distal tip near the caval atrial junction. There are multiple old healed left rib fractures. IMPRESSION: 1. Improved lung aeration from the previous exam. 2. No convincing acute cardiopulmonary disease. Pulmonary edema suggested on prior studies has improved/resolved. Chronic lung changes similar to older exams. Electronically Signed   By: Lajean Manes M.D.   On: 12/17/2019 08:32   ECHOCARDIOGRAM COMPLETE  Result Date:  11/20/2019    ECHOCARDIOGRAM REPORT   Patient Name:   Barry Lewis Date of Exam: 11/20/2019 Medical Rec #:  [id number]   Height:       66.0 in Accession #:    [id number]  Weight:       185.2 lb Date of Birth:  1961-03-12   BSA:          1.935 m Patient Age:    40 years    BP:           161/101 mmHg Patient Gender: M           HR:           63 bpm. Exam Location:  Inpatient Procedure: 2D Echo, 3D Echo, Color Doppler and Cardiac Doppler Indications:  J67.34 Acute systolic (congestive) heart failure  History:        Patient has prior history of Echocardiogram examinations, most                 recent 10/17/2018. CHF, COPD; Risk Factors:Hypertension, Diabetes                 and Dyslipidemia. Prior echo performed at [place].  Sonographer:    Raquel Sarna Senior RDCS Referring Phys: [id number] [place]  1. Severe global reduction in LV systolic function; mild LVH; severe biatrial enlargement; moderate MAC; oscillating density (1 x 1 cm) on ventricular side of posterior MV annulus possibly calcified subvalvular apparatus; cannot exclude vegetation or thrombus.  2. Left ventricular ejection fraction, by estimation, is 25 to 30%. The left ventricle has severely decreased function. The left ventricle demonstrates global hypokinesis. There is mild left ventricular hypertrophy. Left ventricular diastolic parameters  are indeterminate.  3. Right ventricular systolic function is normal. The right ventricular size is normal. There is mildly elevated pulmonary artery systolic pressure.  4. Left atrial size was severely dilated.  5. Right atrial size was severely dilated.  6. The mitral valve is normal in structure. Trivial mitral valve regurgitation. No evidence of mitral stenosis.  7. The aortic valve is tricuspid. Aortic valve regurgitation is not visualized. Mild aortic valve sclerosis is present, with no evidence of aortic valve stenosis.  8. The inferior vena cava is dilated in size with >50% respiratory  variability, suggesting right atrial pressure of 8 mmHg. FINDINGS  Left Ventricle: Left ventricular ejection fraction, by estimation, is 25 to 30%. The left ventricle has severely decreased function. The left ventricle demonstrates global hypokinesis. The left ventricular internal cavity size was normal in size. There is mild left ventricular hypertrophy. Left ventricular diastolic parameters are indeterminate. Right Ventricle: The right ventricular size is normal. Right ventricular systolic function is normal. There is mildly elevated pulmonary artery systolic pressure. The tricuspid regurgitant velocity is 2.40 m/s, and with an assumed right atrial pressure of 15 mmHg, the estimated right ventricular systolic pressure is 40.9 mmHg. Left Atrium: Left atrial size was severely dilated. Right Atrium: Right atrial size was severely dilated. Pericardium: There is no evidence of pericardial effusion. Mitral Valve: The mitral valve is normal in structure. Normal mobility of the mitral valve leaflets. Moderate mitral annular calcification. Trivial mitral valve regurgitation. No evidence of mitral valve stenosis. Tricuspid Valve: The tricuspid valve is normal in structure. Tricuspid valve regurgitation is trivial. No evidence of tricuspid stenosis. Aortic Valve: The aortic valve is tricuspid. Aortic valve regurgitation is not visualized. Mild aortic valve sclerosis is present, with no evidence of aortic valve stenosis. Pulmonic Valve: The pulmonic valve was normal in structure. Pulmonic valve regurgitation is not visualized. No evidence of pulmonic stenosis. Aorta: The aortic root is normal in size and structure. Venous: The inferior vena cava is dilated in size with greater than 50% respiratory variability, suggesting right atrial pressure of 8 mmHg. IAS/Shunts: No atrial level shunt detected by color flow Doppler. Additional Comments: Severe global reduction in LV systolic function; mild LVH; severe biatrial enlargement;  moderate MAC; oscillating density (1 x 1 cm) on ventricular side of posterior MV annulus possibly calcified subvalvular apparatus; cannot exclude  vegetation or thrombus.  LEFT VENTRICLE PLAX 2D LVIDd:         4.80 cm LVIDs:         4.10 cm LV PW:         1.30  cm LV IVS:        1.15 cm LVOT diam:     2.20 cm LV SV:         67 LV SV Index:   35 LVOT Area:     3.80 cm  LV Volumes (MOD) LV vol d, MOD A2C: 184.0 ml LV vol d, MOD A4C: 192.0 ml LV vol s, MOD A2C: 123.0 ml LV vol s, MOD A4C: 139.0 ml LV SV MOD A2C:     61.0 ml LV SV MOD A4C:     192.0 ml LV SV MOD BP:      64.1 ml RIGHT VENTRICLE RV S prime:     9.46 cm/s TAPSE (M-mode): 1.8 cm LEFT ATRIUM              Index       RIGHT ATRIUM           Index LA diam:        4.90 cm  2.53 cm/m  RA Area:     27.20 cm LA Vol (A2C):   102.0 ml 52.70 ml/m RA Volume:   98.60 ml  50.94 ml/m LA Vol (A4C):   97.3 ml  50.27 ml/m LA Biplane Vol: 103.0 ml 53.22 ml/m  AORTIC VALVE LVOT Vmax:   87.70 cm/s LVOT Vmean:  57.600 cm/s LVOT VTI:    0.176 m  AORTA Ao Root diam: 2.90 cm Ao Asc diam:  3.10 cm MITRAL VALVE                TRICUSPID VALVE MV Area (PHT): 4.89 cm     TR Peak grad:   23.0 mmHg MV Decel Time: 155 msec     TR Vmax:        240.00 cm/s MV E velocity: 116.00 cm/s                             SHUNTS                             Systemic VTI:  0.18 m                             Systemic Diam: 2.20 cm Kirk Ruths MD Electronically signed by Kirk Ruths MD Signature Date/Time: 11/20/2019/3:18:51 PM    Final      TODAY-DAY OF DISCHARGE:  Subjective:   Barry Lewis today has no headache,no chest abdominal pain,no new weakness tingling or numbness, feels much better wants to go home today.   Objective:   Blood pressure (!) 162/86, pulse 81, temperature 98.3 F (36.8 C), temperature source Oral, resp. rate 16, height 5' 6" (1.676 m), weight 80.3 kg, SpO2 93 %.  Intake/Output Summary (Last 24 hours) at 12/17/2019 0952 Last data filed at 12/17/2019 0950 Gross  per 24 hour  Intake 1340 ml  Output 1700 ml  Net -360 ml   Filed Weights   12/16/19 0327 12/16/19 0740 12/17/19 0452  Weight: 82.2 kg 79.5 kg 80.3 kg    Exam: Awake Alert, Oriented *3, No new F.N deficits, Normal affect [place].AT,PERRAL Supple Neck,No JVD, No cervical lymphadenopathy appriciated.  Symmetrical Chest wall movement, Good air movement bilaterally, CTAB RRR,No Gallops,Rubs or new Murmurs, No Parasternal Heave +ve B.Sounds, Abd Soft, Non tender, No organomegaly appriciated, No rebound -guarding or rigidity. No Cyanosis, Clubbing or edema, No  new Rash or bruise   PERTINENT RADIOLOGIC STUDIES: DG Chest 2 View  Result Date: 12/16/2019 CLINICAL DATA:  Chest pain EXAM: CHEST - 2 VIEW COMPARISON:  December 15, 2019 FINDINGS: Central catheter tip is at the cavoatrial junction. No pneumothorax. There is persistent consolidation in the lateral left base with small left pleural effusion. There is ill-defined opacity in the right base, similar to prior study. No new areas of opacity evident. There is cardiomegaly with pulmonary venous hypertension. No adenopathy. Multiple healed rib fractures noted on the left. IMPRESSION: Persistent infiltrate with effusion lateral left base. Ill-defined opacity right base is not felt to be appreciably changed. No new opacity evident. Stable cardiomegaly with a degree of pulmonary vascular congestion. Note that the findings present may be indicative of congestive heart failure, although pneumonia in the bases cannot be excluded. Electronically Signed   By: Lowella Grip III M.D.   On: 12/16/2019 17:08   DG Chest Portable 1 View  Result Date: 12/15/2019 CLINICAL DATA:  Chest pain and shortness of breath EXAM: PORTABLE CHEST 1 VIEW COMPARISON:  Portable exam 1334 hours compared to 11/20/2019 FINDINGS: RIGHT jugular line with tip projecting over SVC. Enlargement of cardiac silhouette with pulmonary vascular congestion. Mediastinal contour stable. BILATERAL  pulmonary infiltrates favoring pulmonary edema though multifocal infection is not excluded. Subsegmental atelectasis at the minor fissure. No definite pleural effusion or pneumothorax. IMPRESSION: Enlargement of cardiac silhouette with vascular congestion and probable pulmonary edema/CHF. Linear subsegmental atelectasis at minor fissure. Electronically Signed   By: Lavonia Dana M.D.   On: 12/15/2019 13:46   DG Chest Port 1V same Day  Result Date: 12/17/2019 CLINICAL DATA:  Short of breath. EXAM: PORTABLE CHEST 1 VIEW COMPARISON:  12/16/2019 and older exams. FINDINGS: Previously described lung opacities have improved. Mild residual opacity at the left lateral lung base consistent with chronic scarring. Lungs are hyperexpanded with prominent interstitial markings similar to older exams. No convincing pneumonia or edema. Cardiac silhouette normal in size. No visualized pleural effusion. No pneumothorax. Right internal jugular dual-lumen central venous catheter is stable, distal tip near the caval atrial junction. There are multiple old healed left rib fractures. IMPRESSION: 1. Improved lung aeration from the previous exam. 2. No convincing acute cardiopulmonary disease. Pulmonary edema suggested on prior studies has improved/resolved. Chronic lung changes similar to older exams. Electronically Signed   By: Lajean Manes M.D.   On: 12/17/2019 08:32     PERTINENT LAB RESULTS: CBC: Recent Labs    12/15/19 1347  WBC 7.4  HGB 11.2*  HCT 33.2*  PLT 232   CMET CMP     Component Value Date/Time   NA 135 12/17/2019 0513   K 3.6 12/17/2019 0513   CL 98 12/17/2019 0513   CO2 27 12/17/2019 0513   GLUCOSE 135 (H) 12/17/2019 0513   BUN 30 (H) 12/17/2019 0513   CREATININE 3.60 (H) 12/17/2019 0513   CALCIUM 8.2 (L) 12/17/2019 0513   CALCIUM 7.8 (L) 10/21/2019 1211   PROT 6.2 (L) 11/20/2019 0854   ALBUMIN 2.1 (L) 11/29/2019 0541   AST 19 11/20/2019 0854   ALT 19 11/20/2019 0854   ALKPHOS 146 (H)  11/20/2019 0854   BILITOT 0.5 11/20/2019 0854   GFRNONAA 18 (L) 12/17/2019 0513   GFRAA 21 (L) 12/17/2019 0513    GFR Estimated Creatinine Clearance: 22.8 mL/min (A) (by C-G formula based on SCr of 3.6 mg/dL (H)). No results for input(s): LIPASE, AMYLASE in the last 72 hours. No results for input(s):  CKTOTAL, CKMB, CKMBINDEX, TROPONINI in the last 72 hours. Invalid input(s): POCBNP No results for input(s): DDIMER in the last 72 hours. No results for input(s): HGBA1C in the last 72 hours. No results for input(s): CHOL, HDL, LDLCALC, TRIG, CHOLHDL, LDLDIRECT in the last 72 hours. No results for input(s): TSH, T4TOTAL, T3FREE, THYROIDAB in the last 72 hours.  Invalid input(s): FREET3 No results for input(s): VITAMINB12, FOLATE, FERRITIN, TIBC, IRON, RETICCTPCT in the last 72 hours. Coags: No results for input(s): INR in the last 72 hours.  Invalid input(s): PT Microbiology: Recent Results (from the past 240 hour(s))  SARS CORONAVIRUS 2 (TAT 6-24 HRS) Nasopharyngeal Nasopharyngeal Swab     Status: None   Collection Time: 12/15/19  4:10 PM   Specimen: Nasopharyngeal Swab  Result Value Ref Range Status   SARS Coronavirus 2 NEGATIVE NEGATIVE Final    Comment: (NOTE) SARS-CoV-2 target nucleic acids are NOT DETECTED. The SARS-CoV-2 RNA is generally detectable in upper and lower respiratory specimens during the acute phase of infection. Negative results do not preclude SARS-CoV-2 infection, do not rule out co-infections with other pathogens, and should not be used as the sole basis for treatment or other patient management decisions. Negative results must be combined with clinical observations, patient history, and epidemiological information. The expected result is Negative. Fact Sheet for Patients: SugarRoll.be Fact Sheet for Healthcare Providers: https://www.woods-mathews.com/ This test is not yet approved or cleared by the [place] FDA  and  has been authorized for detection and/or diagnosis of SARS-CoV-2 by FDA under an Emergency Use Authorization (EUA). This EUA will remain  in effect (meaning this test can be used) for the duration of the COVID-19 declaration under Section 56 4(b)(1) of the Act, 21 U.S.C. section 360bbb-3(b)(1), unless the authorization is terminated or revoked sooner. Performed at [facility], [place] [address], [place], [place] [address]     FURTHER DISCHARGE INSTRUCTIONS:  Get Medicines reviewed and adjusted: Please take all your medications with you for your next visit with your Primary MD  Laboratory/radiological data: Please request your Primary MD to go over all hospital tests and procedure/radiological results at the follow up, please ask your Primary MD to get all Hospital records sent to his/her office.  In some cases, they will be blood work, cultures and biopsy results pending at the time of your discharge. Please request that your primary care M.D. goes through all the records of your hospital data and follows up on these results.  Also Note the following: If you experience worsening of your admission symptoms, develop shortness of breath, life threatening emergency, suicidal or homicidal thoughts you must seek medical attention immediately by calling 911 or calling your MD immediately  if symptoms less severe.  You must read complete instructions/literature along with all the possible adverse reactions/side effects for all the Medicines you take and that have been prescribed to you. Take any new Medicines after you have completely understood and accpet all the possible adverse reactions/side effects.   Do not drive when taking Pain medications or sleeping medications (Benzodaizepines)  Do not take more than prescribed Pain, Sleep and Anxiety Medications. It is not advisable to combine anxiety,sleep and pain medications without talking with your primary care practitioner  Special  Instructions: If you have smoked or chewed Tobacco  in the last 2 yrs please stop smoking, stop any regular Alcohol  and or any Recreational drug use.  Wear Seat belts while driving.  Please note: You were cared for by a  hospitalist during your hospital stay. Once you are discharged, your primary care physician will handle any further medical issues. Please note that NO REFILLS for any discharge medications will be authorized once you are discharged, as it is imperative that you return to your primary care physician (or establish a relationship with a primary care physician if you do not have one) for your post hospital discharge needs so that they can reassess your need for medications and monitor your lab values.  Total Time spent coordinating discharge including counseling, education and face to face time equals 35 minutes.  SignedOren Binet 12/17/2019 9:52 AM

## 2019-12-17 NOTE — Progress Notes (Signed)
Patient is aware about discharge orders. IV is taken out, tele monitor has been removed and CCMD notified. Spoke to patient's daughter Huntley Estelle about transportation for her father. She will be arriving within the hour to pick the patient and bring the patient back to [facility] to grab his car.

## 2019-12-17 NOTE — Plan of Care (Signed)
  Problem: Education: Goal: Knowledge of General Education information will improve Description: Including pain rating scale, medication(s)/side effects and non-pharmacologic comfort measures Outcome: Progressing   Problem: Health Behavior/Discharge Planning: Goal: Ability to manage health-related needs will improve Outcome: Progressing   Problem: Education: Goal: Ability to demonstrate management of disease process will improve Outcome: Adequate for Discharge Goal: Ability to verbalize understanding of medication therapies will improve Outcome: Adequate for Discharge Goal: Individualized Educational Video(s) Outcome: Adequate for Discharge   Problem: Activity: Goal: Capacity to carry out activities will improve Outcome: Adequate for Discharge   Problem: Cardiac: Goal: Ability to achieve and maintain adequate cardiopulmonary perfusion will improve Outcome: Adequate for Discharge   Problem: Clinical Measurements: Goal: Ability to maintain clinical measurements within normal limits will improve Outcome: Adequate for Discharge Goal: Will remain free from infection Outcome: Adequate for Discharge Goal: Diagnostic test results will improve Outcome: Adequate for Discharge Goal: Respiratory complications will improve Outcome: Adequate for Discharge Goal: Cardiovascular complication will be avoided Outcome: Adequate for Discharge   Problem: Activity: Goal: Risk for activity intolerance will decrease Outcome: Adequate for Discharge   Problem: Nutrition: Goal: Adequate nutrition will be maintained Outcome: Adequate for Discharge   Problem: Coping: Goal: Level of anxiety will decrease Outcome: Adequate for Discharge   Problem: Elimination: Goal: Will not experience complications related to bowel motility Outcome: Adequate for Discharge Goal: Will not experience complications related to urinary retention Outcome: Adequate for Discharge   Problem: Pain Managment: Goal:  General experience of comfort will improve Outcome: Adequate for Discharge   Problem: Safety: Goal: Ability to remain free from injury will improve Outcome: Adequate for Discharge   Problem: Skin Integrity: Goal: Risk for impaired skin integrity will decrease Outcome: Adequate for Discharge

## 2019-12-17 NOTE — Discharge Instructions (Signed)
Eating Plan for Dialysis Dialysis is a treatment that cleans your blood. It is used when your kidneys are damaged. When you need dialysis, you should watch what you eat. This is because some nutrients can build up in your blood between treatments and make you sick. Your doctor or diet specialist (dietitian) will:  Tell you what nutrients you should include or avoid.  Tell you how much of these nutrients you should get each day.  Help you plan meals.  Tell you how much to drink each day. What are tips for following this plan? Reading food labels  Check food labels for: ? Potassium. This is found in milk, fruits, and vegetables. ? Phosphorus. This is found in milk, cheese, beans, nuts, and carbonated beverages. ? Salt (sodium). This is in processed meats, cured meats, ready-made frozen meals, canned vegetables, and salty snack foods.  Try to find foods that are low in potassium, phosphorus, and sodium.  Look for foods that are labeled "sodium free," "reduced sodium," or "low sodium." Shopping  Do not buy whole-grain and high-fiber foods.  Do not buy or use salt substitutes.  Do not buy processed foods. Cooking  Drain all fluid from cooked vegetables and canned fruits before you eat them.  Before you cook potatoes, cut them into small pieces. Then boil them in unsalted water.  Try using herbs and spices that do not contain sodium to add flavor. Meal planning Most people on dialysis should try to eat:  6-11 servings of grains each day. One serving is equal to 1 slice of bread or  cup of cooked rice or pasta.  2-3 servings of low-potassium vegetables each day. One serving is equal to  cup.  2-3 servings of low-potassium fruits each day. One serving is equal to  cup.  Protein, such as meat, poultry, fish, and eggs. Talk with your doctor or dietitian about the right amount and type of protein to eat.   cup of dairy each day. General information  Follow your doctor's  instructions about how much to drink. You may be told to: ? Write down what you drink. ? Write down the foods you eat that are made mostly from water, such as gelatin and soups. ? Drink from small cups.  Take vitamin and mineral supplements only as told by your doctor.  Take over-the-counter and prescription medicines only as told by your doctor. What foods can I eat?     Fruits Apples. Fresh or frozen berries. Fresh or canned pears, peaches, and pineapple. Grapes. Plums. Vegetables Fresh or frozen broccoli, carrots, and green beans. Cabbage. Cauliflower. Celery. Cucumbers. Eggplant. Radishes. Zucchini. Grains White bread. White rice. Cooked cereal. Unsalted popcorn. Tortillas. Pasta. Meats and other proteins Fresh or frozen beef, pork, chicken, and fish. Eggs. Dairy Cream cheese. Heavy cream. Ricotta cheese. Beverages Apple cider. Cranberry juice. Grape juice. Lemonade. Black coffee. Rice milk (that is not enriched or fortified). Seasonings and condiments Herbs. Spices. Jam and jelly. Honey. Sweets and desserts Sherbet. Cakes. Cookies. Fats and oils Olive oil, canola oil, and safflower oil. Other foods Non-dairy creamer. Non-dairy whipped topping. Homemade broth without salt. The items listed above may not be a complete list of foods and beverages you can eat. Contact your dietitian for more options. What foods should I avoid? Fruits Star fruit. Bananas. Oranges. Kiwi. Nectarines. Prunes. Melon. Dried fruit. Avocado. Vegetables Potatoes. Beets. Tomatoes. Winter squash and pumpkin. Asparagus. Spinach. Parsnips. Grains Whole-grain bread. Whole-grain pasta. High-fiber cereal. Meats and other proteins Canned, smoked, and   cured meats. Packaged lunch meat. Sardines. Nuts and seeds. Peanut butter. Beans and legumes. Dairy Milk. Buttermilk. Yogurt. Cheese and cottage cheese. Processed cheese spreads. Beverages Orange juice. Prune juice. Carbonated soft drinks. Seasonings and  condiments Salt. Salt substitutes. Soy sauce. Sweets and desserts Ice cream. Chocolate. Candied nuts. Fats and oils Butter. Margarine. Other foods Ready-made frozen meals. Canned soups. The items listed above may not be a complete list of foods and beverages you should avoid. Contact your dietitian for more information. Summary  If you are having dialysis, it is important to watch what you eat. Certain nutrients and wastes can build up in your blood and cause you to get sick.  Your dietitian will help you make an eating plan that meets your needs.  Avoid foods that are high in potassium, salt (sodium), and phosphorus. Restrict fluids as told by your doctor or dietitian. This information is not intended to replace advice given to you by your health care provider. Make sure you discuss any questions you have with your health care provider. Document Revised: 11/17/2017 Document Reviewed: 09/01/2017 Elsevier Patient Education  2020 Elsevier Inc.  

## 2019-12-27 ENCOUNTER — Other Ambulatory Visit: Payer: Self-pay | Admitting: *Deleted

## 2019-12-27 DIAGNOSIS — I129 Hypertensive chronic kidney disease with stage 1 through stage 4 chronic kidney disease, or unspecified chronic kidney disease: Secondary | ICD-10-CM

## 2019-12-27 DIAGNOSIS — N184 Chronic kidney disease, stage 4 (severe): Secondary | ICD-10-CM

## 2020-01-04 ENCOUNTER — Inpatient Hospital Stay (HOSPITAL_COMMUNITY): Admit: 2020-01-04 | Payer: Medicare Other
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
## 2020-01-16 ENCOUNTER — Ambulatory Visit: Payer: Medicare Other | Admitting: Cardiovascular Disease

## 2020-01-16 DIAGNOSIS — E782 Mixed hyperlipidemia: Secondary | ICD-10-CM

## 2020-01-16 DIAGNOSIS — I10 Essential (primary) hypertension: Secondary | ICD-10-CM

## 2020-01-16 DIAGNOSIS — I059 Rheumatic mitral valve disease, unspecified: Secondary | ICD-10-CM

## 2020-01-16 DIAGNOSIS — I5022 Chronic systolic (congestive) heart failure: Secondary | ICD-10-CM

## 2020-01-30 ENCOUNTER — Encounter: Payer: Self-pay | Admitting: General Practice

## 2020-03-08 ENCOUNTER — Encounter: Payer: Self-pay | Admitting: Surgery

## 2020-07-27 IMAGING — DX DG FOOT COMPLETE 3+V*R*
4 series · 4 of 4 positions shown · non-contrast
Comparison: 04/27/2019

CLINICAL DATA: Cellulitis.

EXAM:
RIGHT FOOT COMPLETE - 3+ VIEW

[foot ap (1 of 2)]
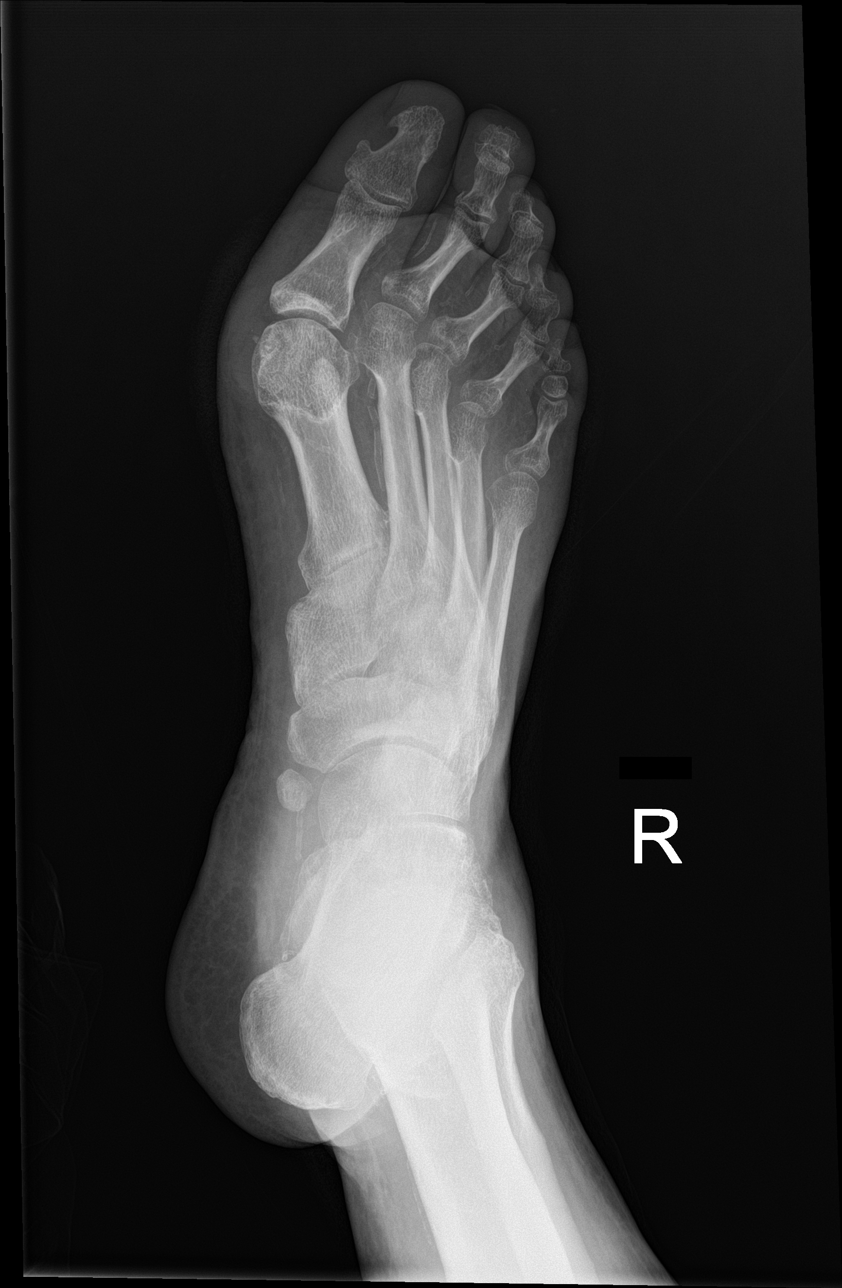

[foot obl]
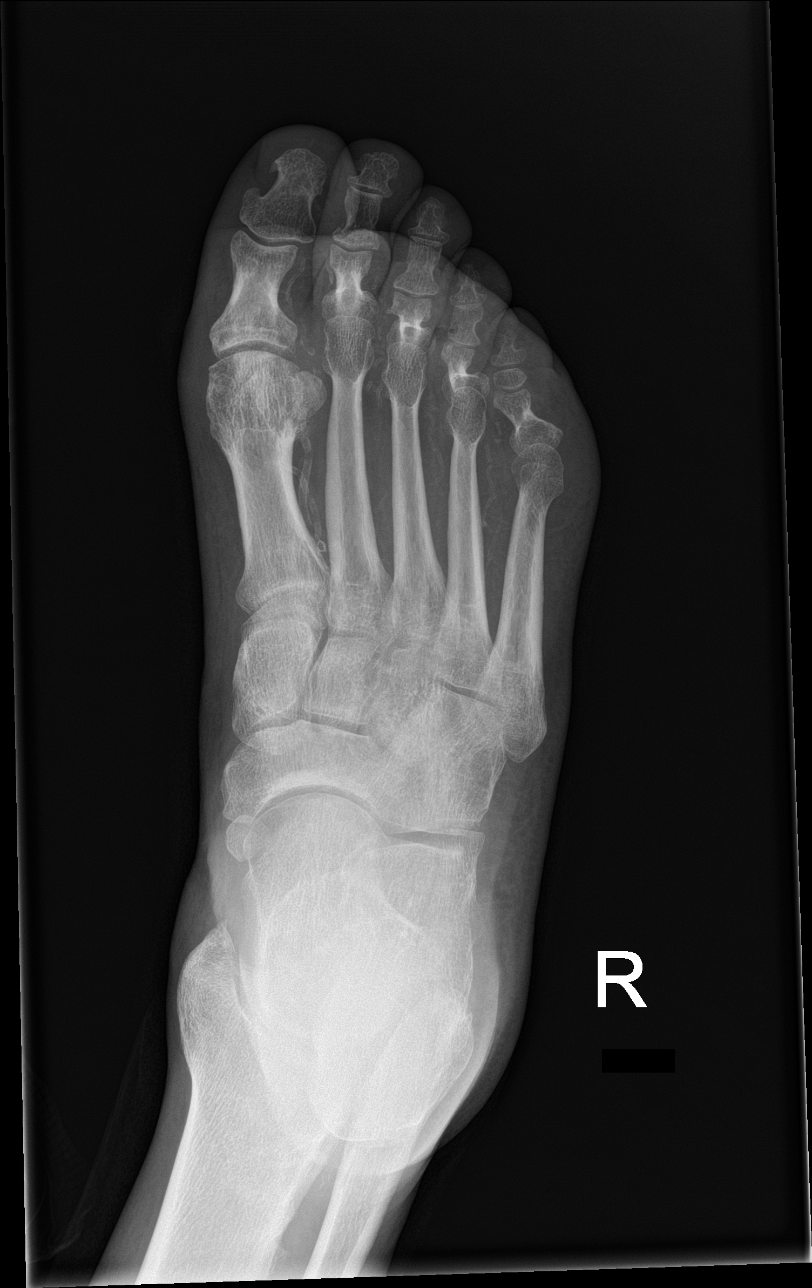

[foot ap (2 of 2)]
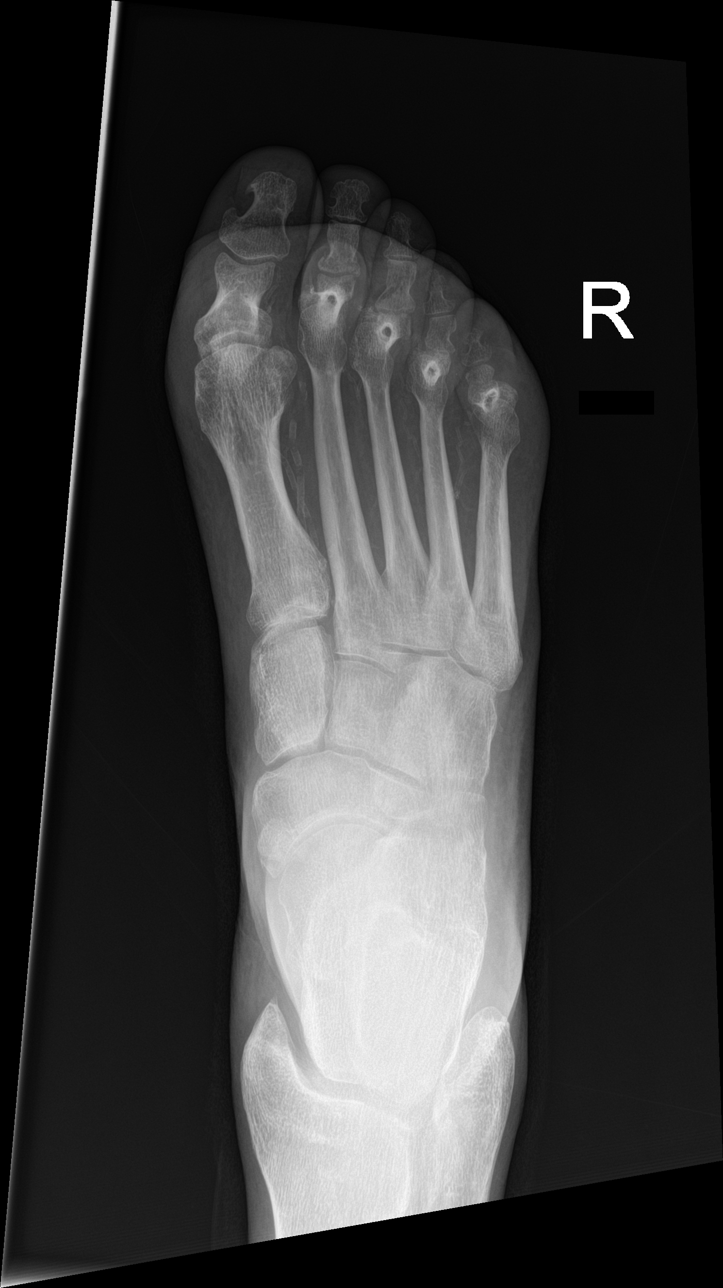

[foot lat]
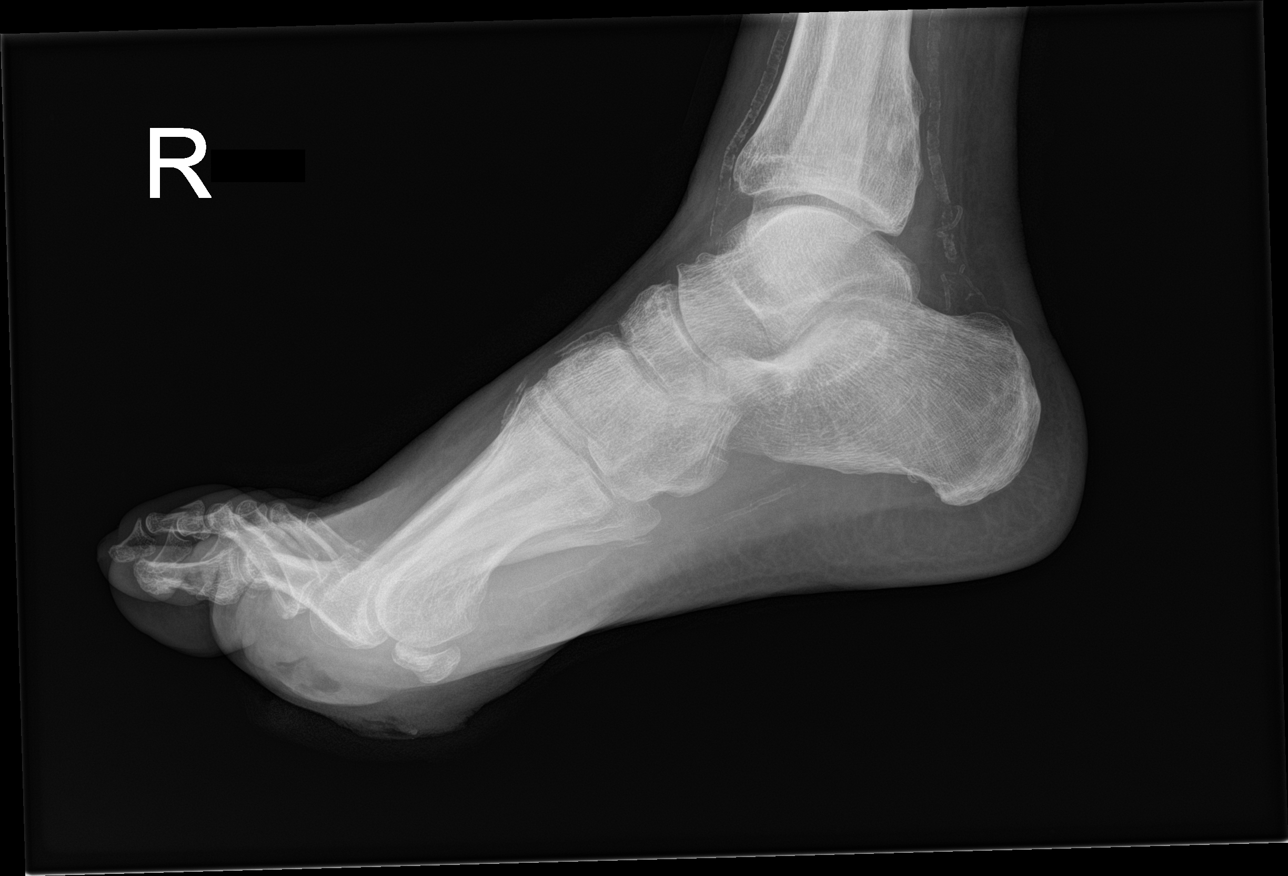

[4 of 4 positions shown; findings below may reference images not displayed]

FINDINGS: Advanced vascular calcifications are noted. There is a fracture at
the base of the middle phalanx of the second digit. This is new
since prior x-ray. There is nonspecific soft tissue swelling about
the foot. Multiple plantar ulcers are noted involving the forefoot.
There is no definite radiographic evidence for osteomyelitis.
IMPRESSION: New fracture at the base of the middle phalanx of the second digit.

Multiple plantar ulcers at the level of the forefoot. No definite
radiographic evidence for osteomyelitis.

There is nonspecific soft tissue swelling about the foot. Advanced
vascular calcifications are noted.

## 2020-07-27 IMAGING — US US RENAL
1 series · 9 of 9 positions shown · non-contrast
Comparison: CT 10/13/2019

CLINICAL DATA: AK I on CKD

EXAM:
RENAL / URINARY TRACT ULTRASOUND COMPLETE

[Series 1: us renal · 9 of 9 slices shown]
[im 1/9]
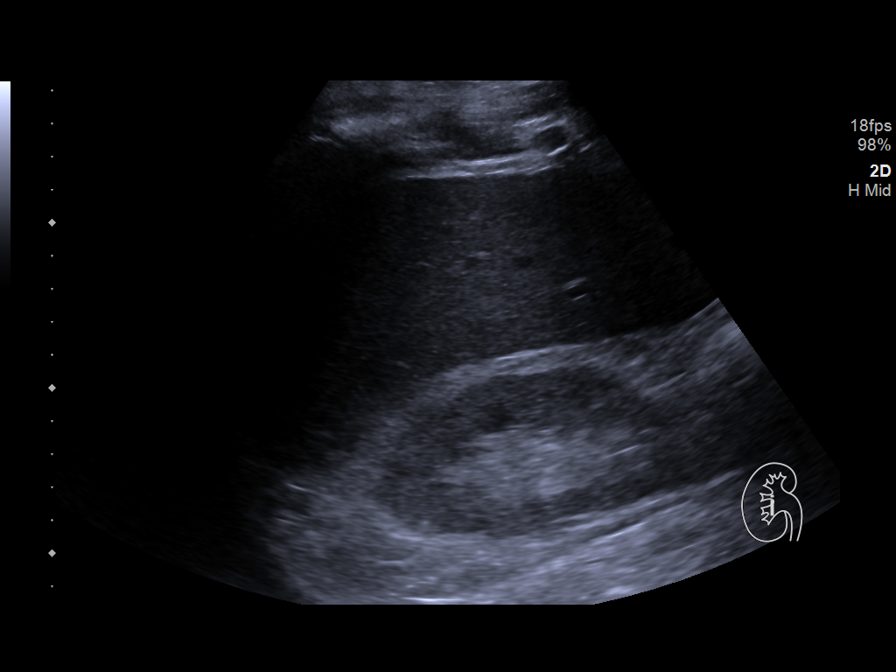
[im 2/9]
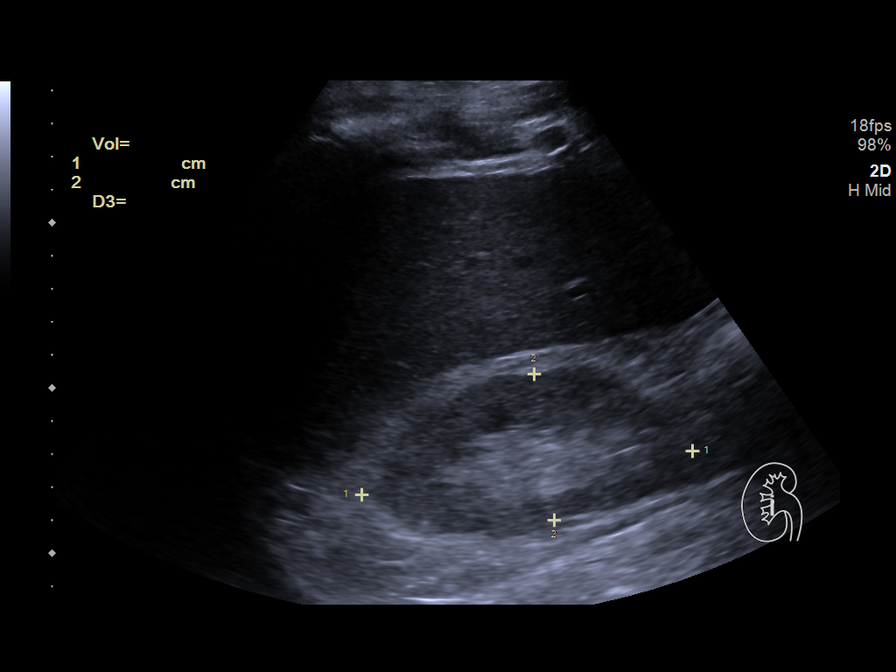
[im 3/9]
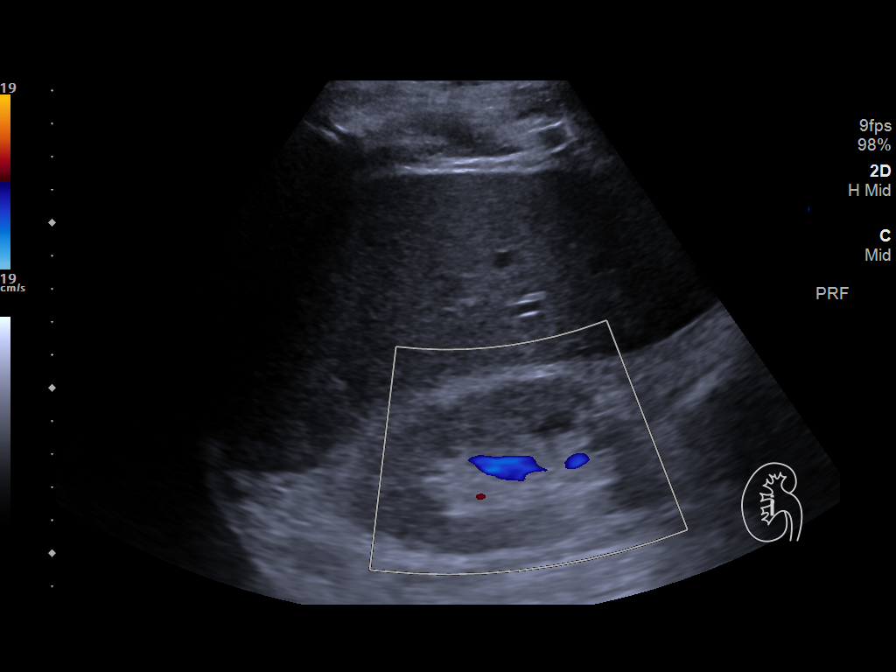
[im 4/9]
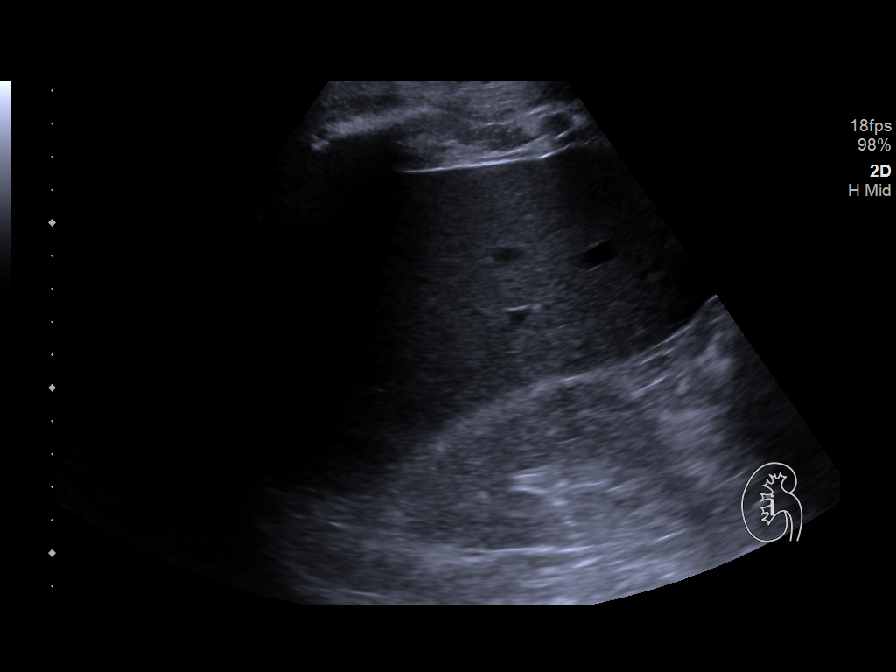
[im 5/9]
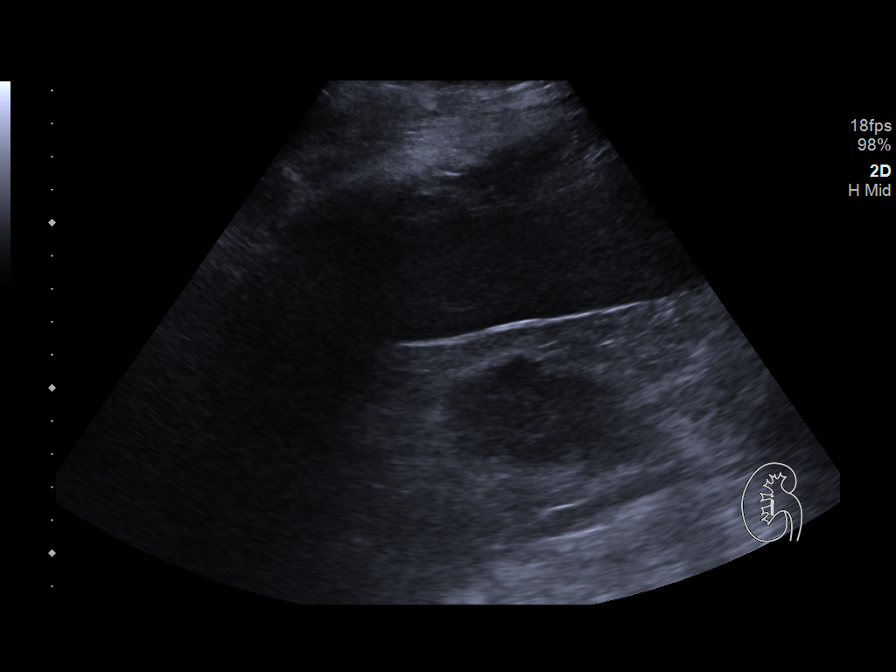
[im 6/9]
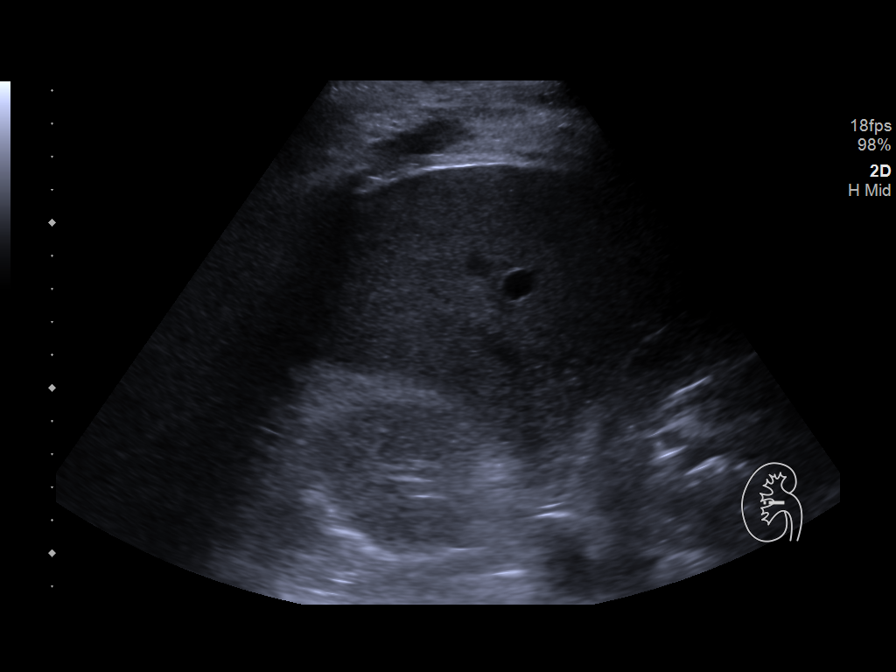
[im 7/9]
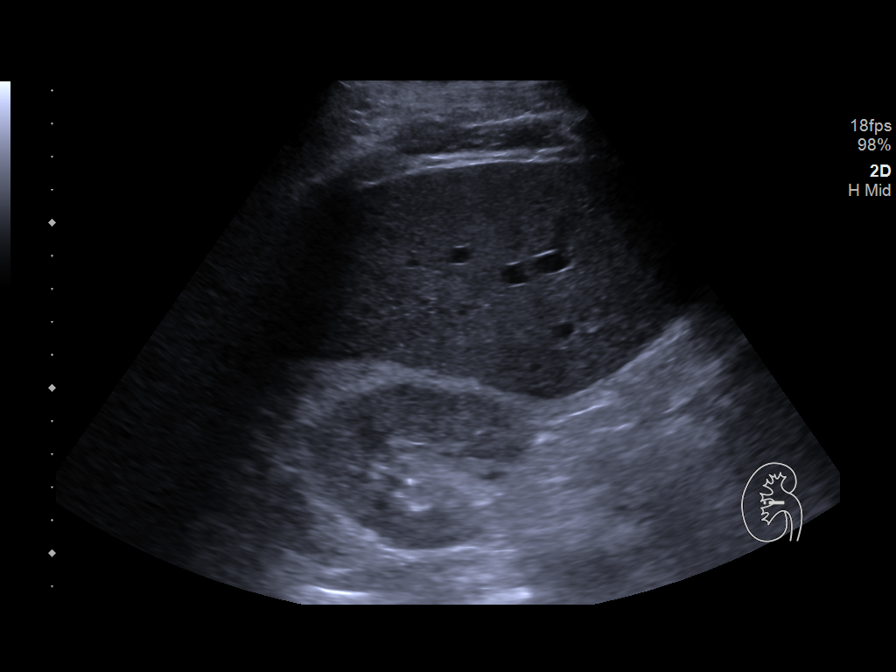
[im 8/9]
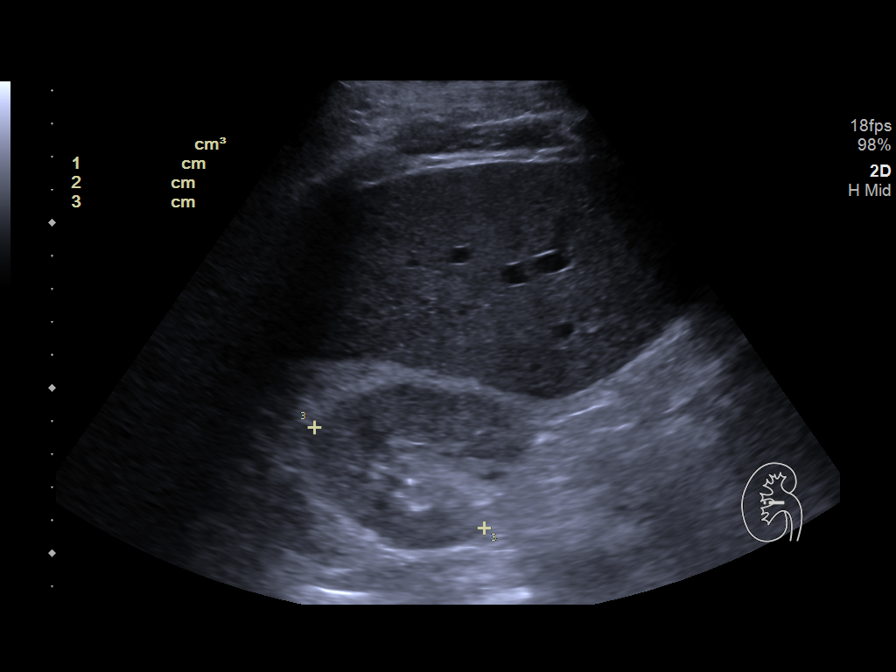
[im 9/9]
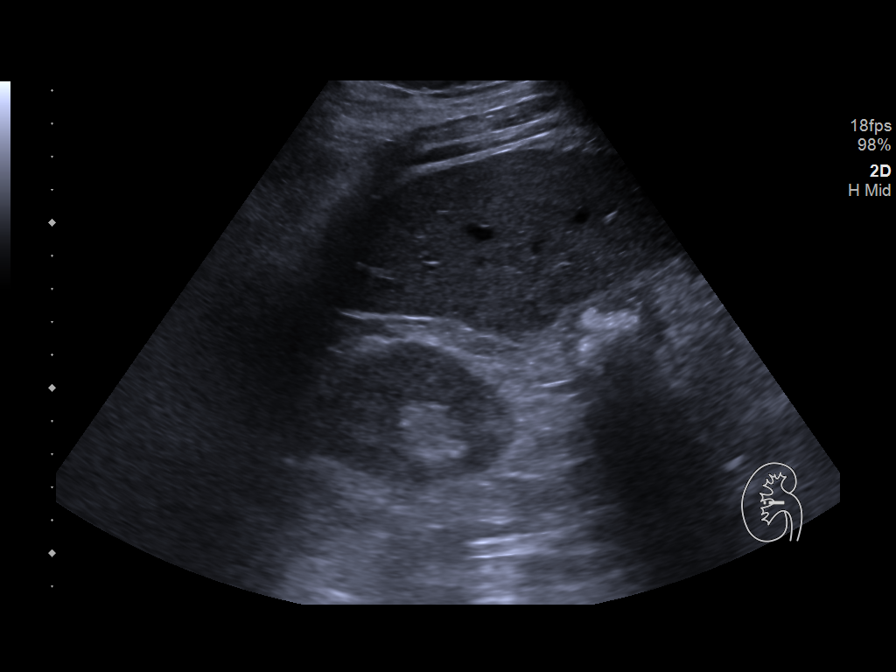

[9 of 9 positions shown; findings below may reference images not displayed]

FINDINGS: Right Kidney:

Renal measurements: 10.1 x 4.5 x 6.0 cm = volume: 141 mL .
Echogenicity within normal limits. No mass or hydronephrosis
visualized.

Left Kidney:

Not assessed.

Bladder:

Not assessed.

Other:

None.
IMPRESSION: 1. No evidence of obstructive uropathy of the right kidney.
2. The left kidney and urinary bladder were not assessed. Patient
refused to complete exam.

## 2020-08-10 IMAGING — US US RENAL
1 series · 14 of 25 positions shown · non-contrast
Comparison: Ultrasound 10/14/2019

CLINICAL DATA: Acute kidney injury

EXAM:
RENAL / URINARY TRACT ULTRASOUND COMPLETE

[Series 1: us renal · 14 of 30 slices shown]
[im 1/30]
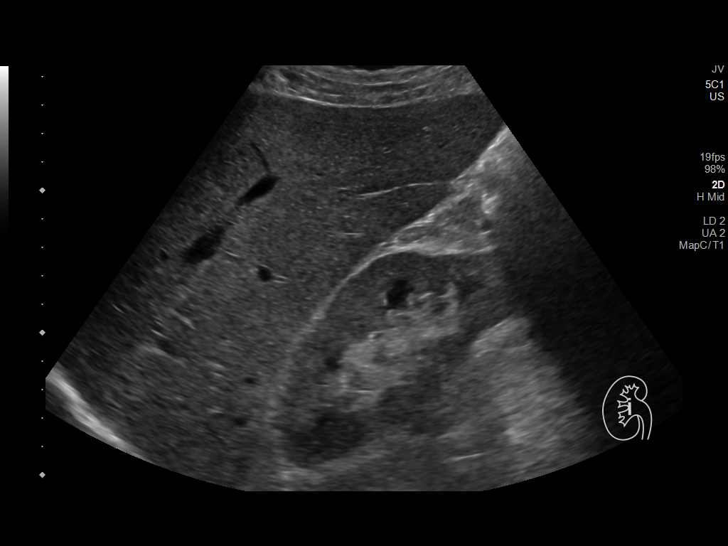
[im 3/30]
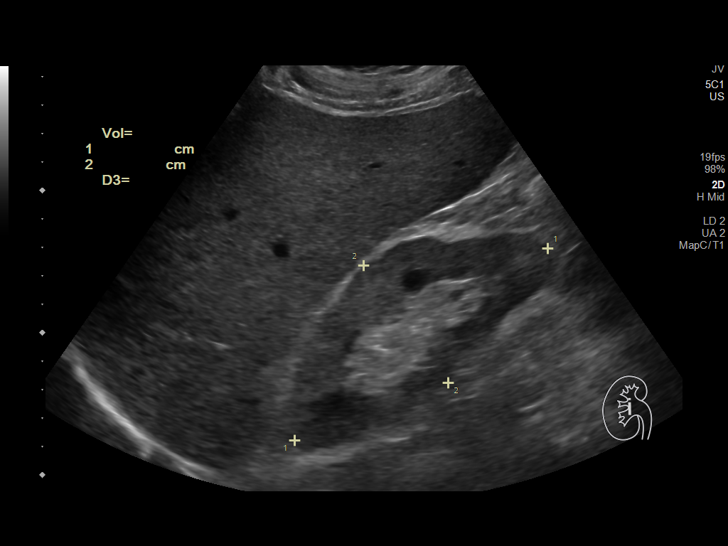
[im 5/30]
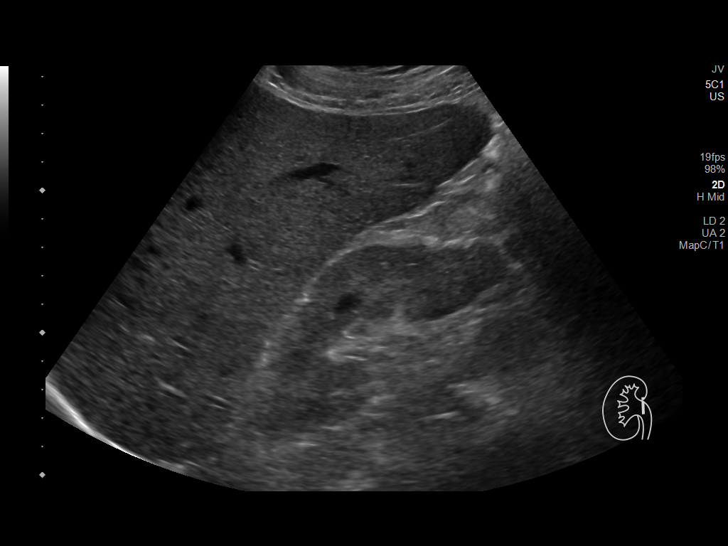
[im 8/30]
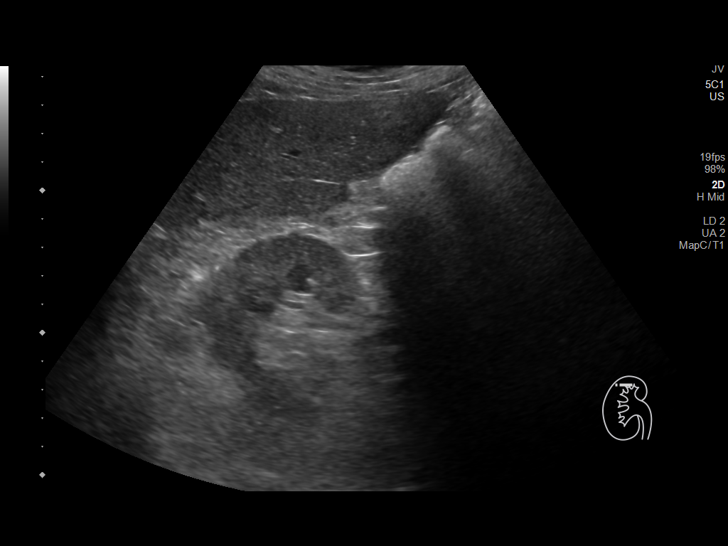
[im 10/30]
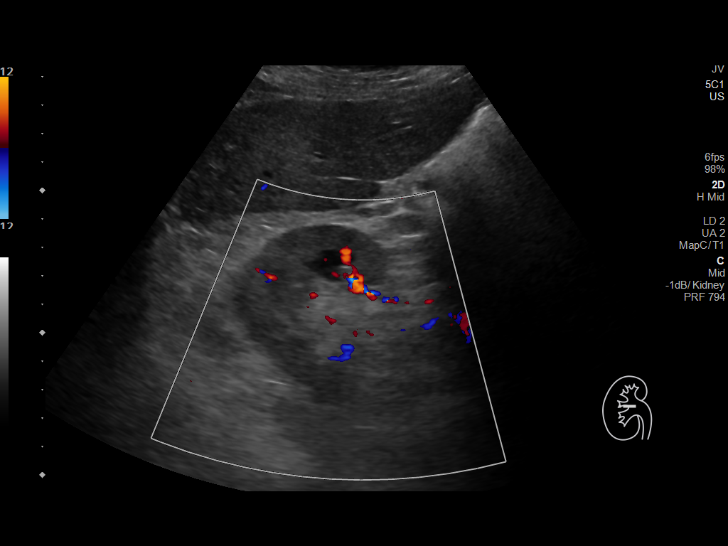
[im 11/30]
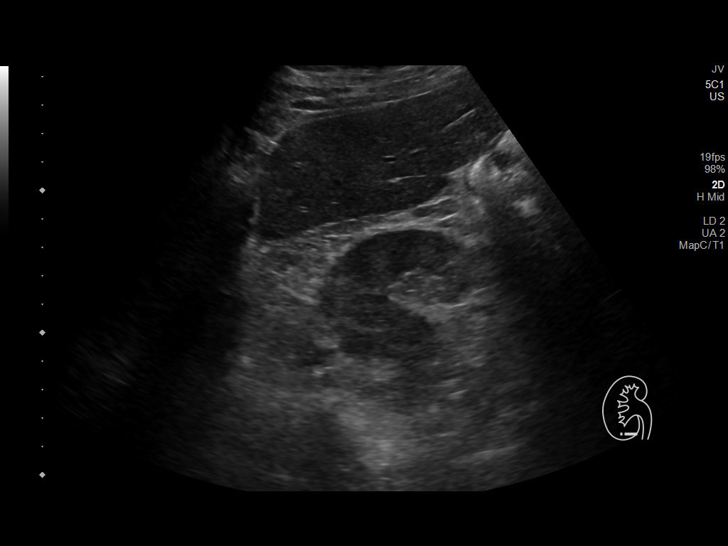
[im 14/30]
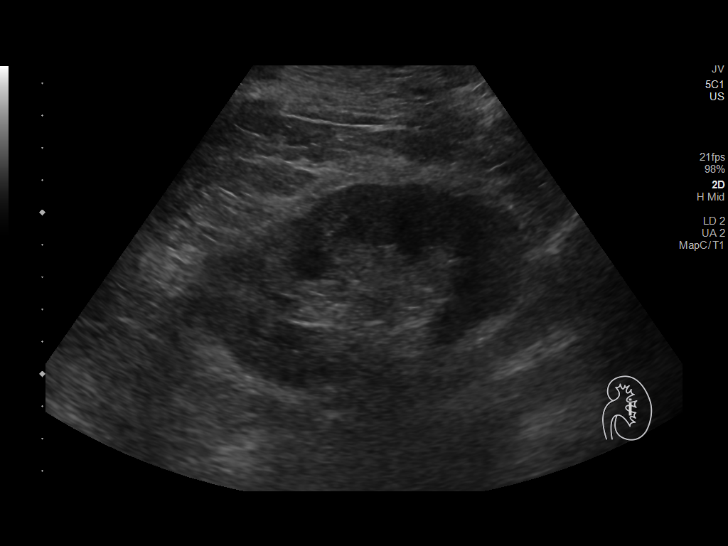
[im 16/30]
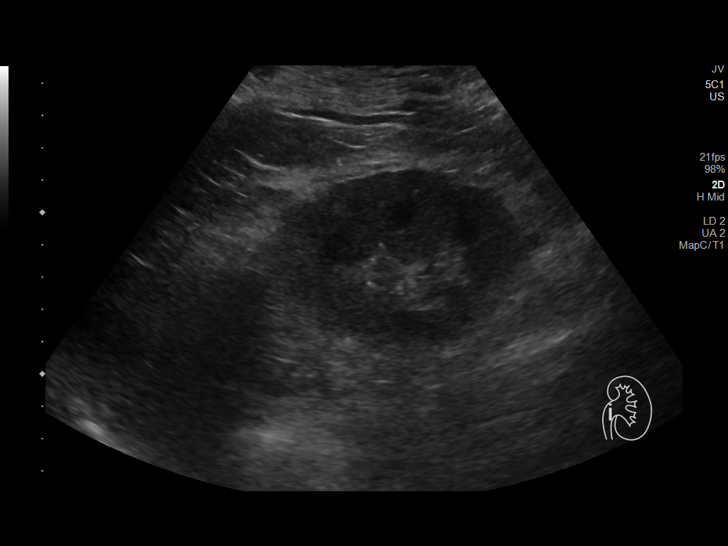
[im 19/30]
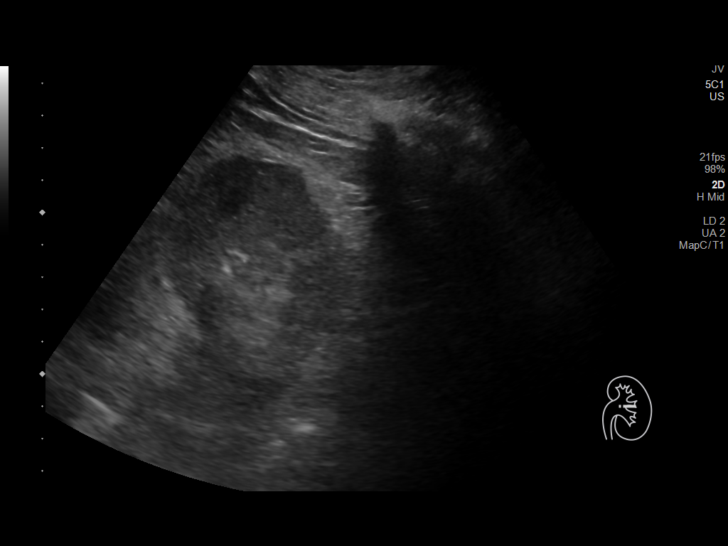
[im 20/30]
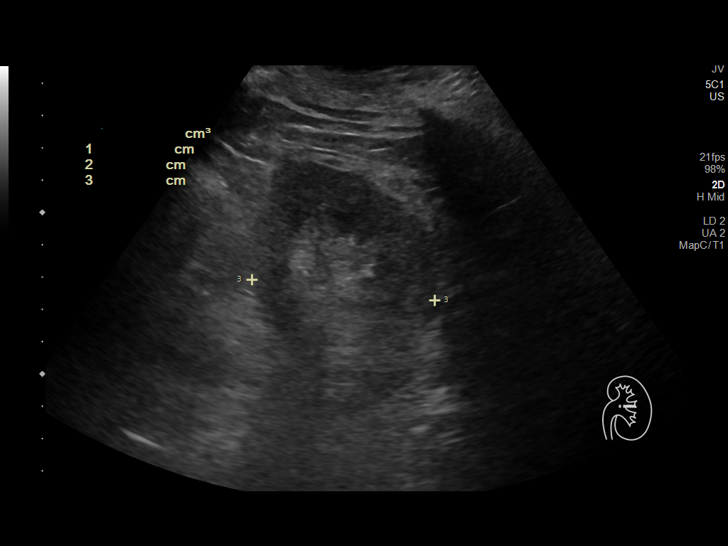
[im 22/30]
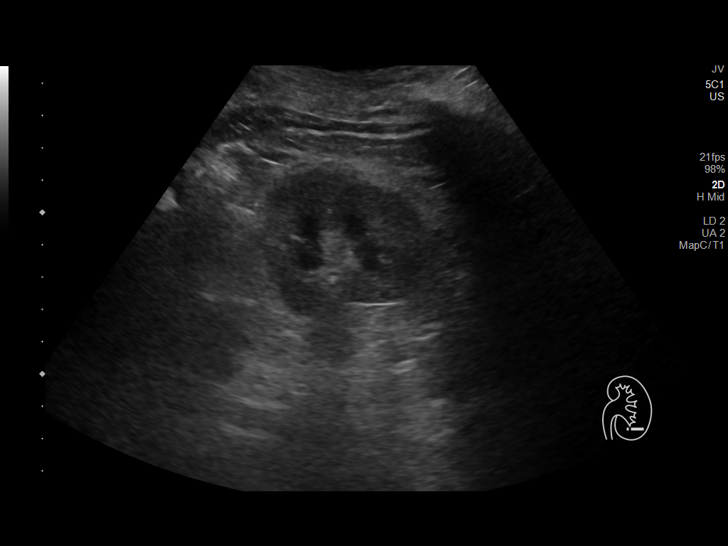
[im 25/30]
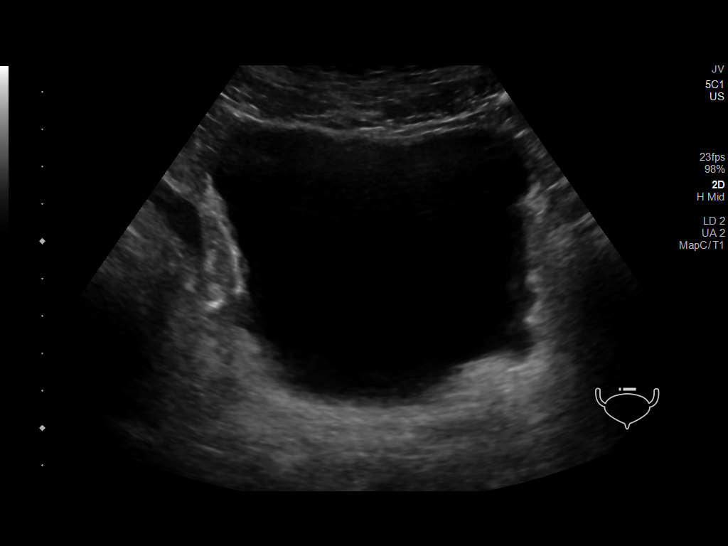
[im 27/30]
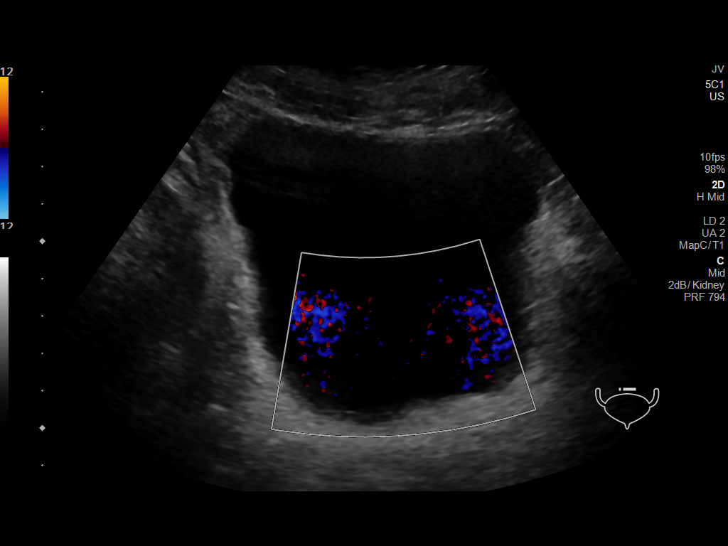
[im 30/30]
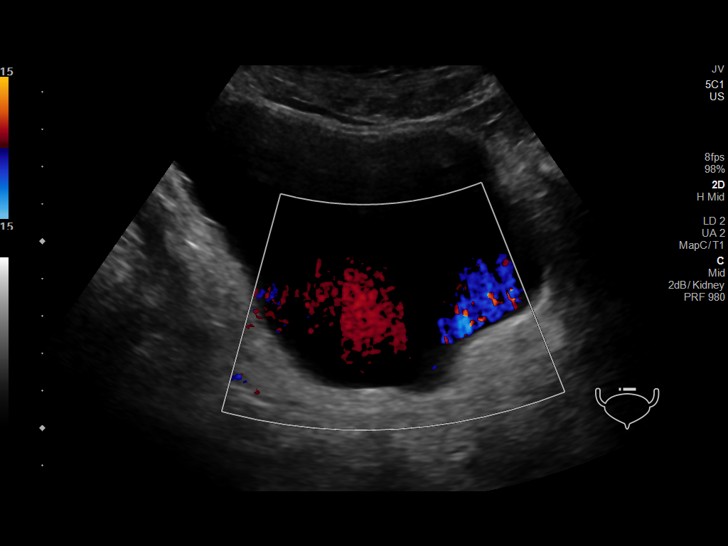

[14 of 25 positions shown; findings below may reference images not displayed]

FINDINGS: Right Kidney:

Renal measurements: 11.6 x 5.1 x 6 cm = volume: 176.3 mL .
Echogenicity within normal limits. No mass or hydronephrosis
visualized.

Left Kidney:

Renal measurements: 10.8 x 6.2 x 5.7 cm = volume: 199 mL.
Echogenicity within normal limits. No mass or hydronephrosis
visualized.

Bladder:

Appears normal for degree of bladder distention.

Other:

Prevoid bladder volume 290 cc.
IMPRESSION: Negative renal ultrasound
